# Patient Record
Sex: Female | Born: 1955
Health system: Southern US, Community
[De-identification: ages and names within clinical notes are randomized; demographics above are authoritative.]

## PROBLEM LIST (undated history)

## (undated) DIAGNOSIS — G43909 Migraine, unspecified, not intractable, without status migrainosus: Secondary | ICD-10-CM

## (undated) DIAGNOSIS — K219 Gastro-esophageal reflux disease without esophagitis: Secondary | ICD-10-CM

## (undated) DIAGNOSIS — Z9889 Other specified postprocedural states: Secondary | ICD-10-CM

## (undated) DIAGNOSIS — K449 Diaphragmatic hernia without obstruction or gangrene: Secondary | ICD-10-CM

## (undated) DIAGNOSIS — M1990 Unspecified osteoarthritis, unspecified site: Secondary | ICD-10-CM

## (undated) HISTORY — PX: ANTERIOR CRUCIATE LIGAMENT REPAIR: SHX115

## (undated) HISTORY — PX: ABDOMINAL HYSTERECTOMY: SHX81

## (undated) HISTORY — PX: BREAST SURGERY: SHX581

## (undated) HISTORY — PX: CHOLECYSTECTOMY: SHX55

---

## 1998-03-04 ENCOUNTER — Encounter: Payer: Self-pay | Admitting: Neurosurgery

## 1998-03-04 ENCOUNTER — Ambulatory Visit (HOSPITAL_COMMUNITY): Admission: RE | Admit: 1998-03-04 | Discharge: 1998-03-04 | Payer: Self-pay | Admitting: Neurosurgery

## 1998-05-14 ENCOUNTER — Ambulatory Visit: Admission: RE | Admit: 1998-05-14 | Discharge: 1998-05-14 | Payer: Self-pay | Admitting: Internal Medicine

## 1998-05-14 ENCOUNTER — Encounter: Payer: Self-pay | Admitting: Neurosurgery

## 1998-10-22 ENCOUNTER — Other Ambulatory Visit: Admission: RE | Admit: 1998-10-22 | Discharge: 1998-10-22 | Payer: Self-pay | Admitting: *Deleted

## 1998-11-18 ENCOUNTER — Ambulatory Visit (HOSPITAL_COMMUNITY): Admission: RE | Admit: 1998-11-18 | Discharge: 1998-11-18 | Payer: Self-pay | Admitting: *Deleted

## 1998-11-18 ENCOUNTER — Encounter: Payer: Self-pay | Admitting: *Deleted

## 1999-07-03 ENCOUNTER — Emergency Department (HOSPITAL_COMMUNITY): Admission: EM | Admit: 1999-07-03 | Discharge: 1999-07-03 | Payer: Self-pay | Admitting: Emergency Medicine

## 1999-07-03 ENCOUNTER — Encounter: Payer: Self-pay | Admitting: Emergency Medicine

## 1999-10-08 ENCOUNTER — Encounter: Payer: Self-pay | Admitting: Emergency Medicine

## 1999-10-08 ENCOUNTER — Inpatient Hospital Stay (HOSPITAL_COMMUNITY): Admission: EM | Admit: 1999-10-08 | Discharge: 1999-10-09 | Payer: Self-pay | Admitting: Emergency Medicine

## 1999-10-09 ENCOUNTER — Encounter: Payer: Self-pay | Admitting: Internal Medicine

## 1999-10-12 ENCOUNTER — Ambulatory Visit (HOSPITAL_COMMUNITY): Admission: RE | Admit: 1999-10-12 | Discharge: 1999-10-12 | Payer: Self-pay | Admitting: Interventional Cardiology

## 1999-10-15 DIAGNOSIS — C4491 Basal cell carcinoma of skin, unspecified: Secondary | ICD-10-CM

## 1999-10-15 HISTORY — DX: Basal cell carcinoma of skin, unspecified: C44.91

## 1999-11-05 ENCOUNTER — Ambulatory Visit (HOSPITAL_COMMUNITY): Admission: RE | Admit: 1999-11-05 | Discharge: 1999-11-05 | Payer: Self-pay | Admitting: Family Medicine

## 1999-11-05 ENCOUNTER — Encounter: Payer: Self-pay | Admitting: Family Medicine

## 1999-11-23 ENCOUNTER — Encounter: Payer: Self-pay | Admitting: *Deleted

## 1999-11-23 ENCOUNTER — Ambulatory Visit (HOSPITAL_COMMUNITY): Admission: RE | Admit: 1999-11-23 | Discharge: 1999-11-23 | Payer: Self-pay | Admitting: *Deleted

## 2000-03-25 ENCOUNTER — Encounter: Admission: RE | Admit: 2000-03-25 | Discharge: 2000-06-23 | Payer: Self-pay | Admitting: Neurosurgery

## 2000-05-23 DIAGNOSIS — D229 Melanocytic nevi, unspecified: Secondary | ICD-10-CM

## 2000-05-23 HISTORY — DX: Melanocytic nevi, unspecified: D22.9

## 2000-07-13 ENCOUNTER — Inpatient Hospital Stay (HOSPITAL_COMMUNITY): Admission: RE | Admit: 2000-07-13 | Discharge: 2000-07-15 | Payer: Self-pay | Admitting: *Deleted

## 2000-11-23 ENCOUNTER — Encounter: Payer: Self-pay | Admitting: *Deleted

## 2000-11-23 ENCOUNTER — Ambulatory Visit (HOSPITAL_COMMUNITY): Admission: RE | Admit: 2000-11-23 | Discharge: 2000-11-23 | Payer: Self-pay | Admitting: *Deleted

## 2001-06-22 ENCOUNTER — Ambulatory Visit (HOSPITAL_COMMUNITY): Admission: RE | Admit: 2001-06-22 | Discharge: 2001-06-22 | Payer: Self-pay | Admitting: Family Medicine

## 2001-06-22 ENCOUNTER — Encounter: Payer: Self-pay | Admitting: Family Medicine

## 2001-07-11 ENCOUNTER — Ambulatory Visit (HOSPITAL_COMMUNITY): Admission: RE | Admit: 2001-07-11 | Discharge: 2001-07-11 | Payer: Self-pay | Admitting: Pediatrics

## 2001-08-21 ENCOUNTER — Other Ambulatory Visit: Admission: RE | Admit: 2001-08-21 | Discharge: 2001-08-21 | Payer: Self-pay | Admitting: *Deleted

## 2001-11-27 ENCOUNTER — Encounter: Payer: Self-pay | Admitting: *Deleted

## 2001-11-27 ENCOUNTER — Ambulatory Visit (HOSPITAL_COMMUNITY): Admission: RE | Admit: 2001-11-27 | Discharge: 2001-11-27 | Payer: Self-pay | Admitting: *Deleted

## 2002-08-27 ENCOUNTER — Other Ambulatory Visit: Admission: RE | Admit: 2002-08-27 | Discharge: 2002-08-27 | Payer: Self-pay | Admitting: *Deleted

## 2002-11-29 ENCOUNTER — Ambulatory Visit (HOSPITAL_COMMUNITY): Admission: RE | Admit: 2002-11-29 | Discharge: 2002-11-29 | Payer: Self-pay | Admitting: *Deleted

## 2002-11-29 ENCOUNTER — Encounter: Payer: Self-pay | Admitting: *Deleted

## 2003-02-28 ENCOUNTER — Ambulatory Visit (HOSPITAL_COMMUNITY): Admission: RE | Admit: 2003-02-28 | Discharge: 2003-02-28 | Payer: Self-pay | Admitting: *Deleted

## 2003-02-28 ENCOUNTER — Encounter: Payer: Self-pay | Admitting: *Deleted

## 2003-05-21 ENCOUNTER — Ambulatory Visit (HOSPITAL_COMMUNITY): Admission: RE | Admit: 2003-05-21 | Discharge: 2003-05-21 | Payer: Self-pay | Admitting: Plastic Surgery

## 2003-05-21 ENCOUNTER — Ambulatory Visit (HOSPITAL_BASED_OUTPATIENT_CLINIC_OR_DEPARTMENT_OTHER): Admission: RE | Admit: 2003-05-21 | Discharge: 2003-05-21 | Payer: Self-pay | Admitting: Plastic Surgery

## 2003-05-21 ENCOUNTER — Encounter (INDEPENDENT_AMBULATORY_CARE_PROVIDER_SITE_OTHER): Payer: Self-pay | Admitting: Specialist

## 2003-08-28 ENCOUNTER — Other Ambulatory Visit: Admission: RE | Admit: 2003-08-28 | Discharge: 2003-08-28 | Payer: Self-pay | Admitting: *Deleted

## 2003-12-03 ENCOUNTER — Ambulatory Visit (HOSPITAL_COMMUNITY): Admission: RE | Admit: 2003-12-03 | Discharge: 2003-12-03 | Payer: Self-pay | Admitting: *Deleted

## 2004-03-31 DIAGNOSIS — C4491 Basal cell carcinoma of skin, unspecified: Secondary | ICD-10-CM

## 2004-03-31 HISTORY — DX: Basal cell carcinoma of skin, unspecified: C44.91

## 2004-09-14 ENCOUNTER — Other Ambulatory Visit: Admission: RE | Admit: 2004-09-14 | Discharge: 2004-09-14 | Payer: Self-pay | Admitting: Obstetrics and Gynecology

## 2004-10-16 ENCOUNTER — Ambulatory Visit (HOSPITAL_COMMUNITY): Admission: RE | Admit: 2004-10-16 | Discharge: 2004-10-16 | Payer: Self-pay | Admitting: Family Medicine

## 2004-10-16 ENCOUNTER — Emergency Department (HOSPITAL_COMMUNITY): Admission: EM | Admit: 2004-10-16 | Discharge: 2004-10-16 | Payer: Self-pay | Admitting: Family Medicine

## 2004-10-16 ENCOUNTER — Ambulatory Visit (HOSPITAL_COMMUNITY): Admission: RE | Admit: 2004-10-16 | Discharge: 2004-10-16 | Payer: Self-pay | Admitting: *Deleted

## 2004-12-04 ENCOUNTER — Ambulatory Visit (HOSPITAL_COMMUNITY): Admission: RE | Admit: 2004-12-04 | Discharge: 2004-12-04 | Payer: Self-pay | Admitting: Obstetrics and Gynecology

## 2004-12-08 ENCOUNTER — Ambulatory Visit: Admission: RE | Admit: 2004-12-08 | Discharge: 2004-12-08 | Payer: Self-pay | Admitting: Gynecologic Oncology

## 2004-12-29 ENCOUNTER — Encounter (INDEPENDENT_AMBULATORY_CARE_PROVIDER_SITE_OTHER): Payer: Self-pay | Admitting: *Deleted

## 2004-12-29 ENCOUNTER — Inpatient Hospital Stay (HOSPITAL_COMMUNITY): Admission: RE | Admit: 2004-12-29 | Discharge: 2005-01-01 | Payer: Self-pay | Admitting: Obstetrics and Gynecology

## 2005-02-16 ENCOUNTER — Ambulatory Visit: Admission: RE | Admit: 2005-02-16 | Discharge: 2005-02-16 | Payer: Self-pay | Admitting: Gynecologic Oncology

## 2005-04-18 ENCOUNTER — Ambulatory Visit (HOSPITAL_COMMUNITY): Admission: RE | Admit: 2005-04-18 | Discharge: 2005-04-18 | Payer: Self-pay | Admitting: Neurosurgery

## 2005-06-02 ENCOUNTER — Ambulatory Visit (HOSPITAL_COMMUNITY): Admission: RE | Admit: 2005-06-02 | Discharge: 2005-06-02 | Payer: Self-pay | Admitting: Neurosurgery

## 2006-01-27 ENCOUNTER — Ambulatory Visit (HOSPITAL_COMMUNITY): Admission: RE | Admit: 2006-01-27 | Discharge: 2006-01-27 | Payer: Self-pay | Admitting: Family Medicine

## 2006-03-11 ENCOUNTER — Ambulatory Visit: Payer: Self-pay | Admitting: Gastroenterology

## 2006-03-25 ENCOUNTER — Ambulatory Visit (HOSPITAL_COMMUNITY): Admission: RE | Admit: 2006-03-25 | Discharge: 2006-03-25 | Payer: Self-pay | Admitting: Gastroenterology

## 2006-07-29 ENCOUNTER — Ambulatory Visit (HOSPITAL_COMMUNITY): Admission: RE | Admit: 2006-07-29 | Discharge: 2006-07-29 | Payer: Self-pay | Admitting: Gastroenterology

## 2007-01-30 ENCOUNTER — Ambulatory Visit (HOSPITAL_COMMUNITY): Admission: RE | Admit: 2007-01-30 | Discharge: 2007-01-30 | Payer: Self-pay | Admitting: Obstetrics and Gynecology

## 2008-01-31 ENCOUNTER — Ambulatory Visit (HOSPITAL_COMMUNITY): Admission: RE | Admit: 2008-01-31 | Discharge: 2008-01-31 | Payer: Self-pay | Admitting: Obstetrics and Gynecology

## 2008-05-28 ENCOUNTER — Emergency Department (HOSPITAL_COMMUNITY): Admission: EM | Admit: 2008-05-28 | Discharge: 2008-05-28 | Payer: Self-pay | Admitting: Emergency Medicine

## 2008-06-21 DIAGNOSIS — Z87442 Personal history of urinary calculi: Secondary | ICD-10-CM

## 2008-06-21 HISTORY — DX: Personal history of urinary calculi: Z87.442

## 2009-01-31 ENCOUNTER — Ambulatory Visit (HOSPITAL_COMMUNITY): Admission: RE | Admit: 2009-01-31 | Discharge: 2009-01-31 | Payer: Self-pay | Admitting: Obstetrics and Gynecology

## 2009-09-01 ENCOUNTER — Emergency Department (HOSPITAL_COMMUNITY): Admission: EM | Admit: 2009-09-01 | Discharge: 2009-09-01 | Payer: Self-pay | Admitting: Emergency Medicine

## 2010-02-02 ENCOUNTER — Ambulatory Visit (HOSPITAL_COMMUNITY): Admission: RE | Admit: 2010-02-02 | Discharge: 2010-02-02 | Payer: Self-pay | Admitting: Obstetrics and Gynecology

## 2010-11-06 NOTE — Discharge Summary (Signed)
[facility]  Patient:    Kendra Le, Kendra Le                      MRN: [id number] Adm. Date:  [id number] Disc. Date: [id number] Attending:  Donne Hazel                           Discharge Summary  HISTORY OF PRESENT ILLNESS:   Ms. Kendra Le is a 55 year old female, status post total abdominal hysterectomy in the past.  The patient is admitted for laparoscopic bilateral salpingo-oophorectomy due to persistent ovarian cyst and pelvic pain.  These cysts were followed conservatively for a short period of time; these cysts persisted and in fact enlarged.  She is admitted for removal of these.  PAST MEDICAL HISTORY:         History of kidney stones.  PAST SURGICAL HISTORY:        1. History of cholecystectomy.                               2. Total abdominal hysterectomy.                               3. History of left knee surgery.  CURRENT MEDICATIONS:          Aspirin and multivitamins.  ALLERGIES:                    PENICILLIN, VERSED, and E-MYCIN.  SOCIAL HISTORY:               Negative.  PHYSICAL EXAMINATION:         Please see clinic admission history and physical.  ADMISSION DIAGNOSES:          1. Bilateral ovarian cyst.                               2. Pelvic pain.  HOSPITAL COURSE:              The patient was admitted on the same day of surgery, July 12, 2000.  At the last moment, I decided to proceed with laparotomy instead of laparoscopy due to the size of the ovarian cyst.  At the time of surgery, severe pelvic adhesions were encountered, bilateral ovarian endometriomas was removed.  The surgery went well but was lengthy.  Bilateral oophorectomy was carried out without incident.  The patients postoperative course was somewhat complicated by mild ileus. This was treated with conservative medical management and her hemoglobin stabilized at 10.3.  She was ambulating and soon had return of normal bowel and bladder function prior to  discharge.  She is discharged postoperative day #3 in stable condition.  DISCHARGE DIAGNOSES:          1. Status post laparotomy with bilateral                                  salpingo-oophorectomy for bilateral ovarian                                  cysts - endometriomas.  2. Severe pelvic adhesions.                               3. Mild postoperative ileus.  PLAN:                         1. Home.                               2. Darvocet, #30.                               3. Premarin 1.25 mg one p.o. q.d.                               4. Followup in the office in one week to                                  remove staples and for a recheck.  Iron                                  will be started for her mild anemia and at                                  the postoperative check when she has more                                  normal bowel function.  Routine discharge instructions and postoperative care given. DD:  08/09/00 TD:  08/10/00 Job: [id number]

## 2010-11-06 NOTE — Assessment & Plan Note (Signed)
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 She is referred through the courtesy of  Dr. Henderson Cloud for evaluation of passage of flatus per vagina.   Kendra Le has a very long and involved history of recurrent pelvic surgery  and abdominal surgery. Apparently, in 1994 she had a cholecystectomy  performed by Dr. Crista Luria, at that time was noted to have a very  large uterus with uterine fibroids. She subsequently had a hysterectomy done  and did well, although she had recurrent problems with the left ovarian  cysts that required bilateral oophorectomy apparently in 2000. She continued  to have lower abdominal pain and apparently underwent CT scan of the abdomen  last year. It showed endometriosis and a ovarian remnant on the left side  and a large ovarian cyst. This was removed surgically in July 2006. I will  try to obtain these reports for review.   Apparently, at the time of her most recent surgery, she did have a prolonged  ileus but had no trauma to her bowel which she is aware. Since her surgery,  she has had continued intermittent left lower quadrant pain described as a  spasmodic pain which will last anywhere from a few minutes to two hours in  duration, without any precipitating or alleviating elements. She is having  fairly regular bowel movements, which for her is three or four soft bowel  movements without melena or hematochezia. Recent exam by Dr. Henderson Cloud  averaged one out of three stools were guaiac positive. Pain does not awaken  the patient from sleep, is not related to having a bowel movement. There is  no abdominal gas, bloating, upper gastrointestinal,  or hepatobiliary  complaints except for occasional acid reflux managed by antacids. She has  noticed passage of flatus per vagina and apparently saw Dr. Henderson Cloud and had  a detailed exam including colposcopy that was negative. Patient has followed  a regular diet and has no anorexia or weight loss. She denies fever, chills,  skin rashes, joint pains, oral stomatitis, or any history of inflammatory  bowel disease. As mentioned above, she is status post cholecystectomy and  hysterectomy.   Her past medical history is also remarkable for recurrent kidney stones and  chronic headaches. She has been told in the past she had a hiatal hernia.   MEDICATIONS:  1. Vivelle patch 0.1 mg twice a week.  2. Aspirin 81 mg daily.   ALLERGIES:  PENICILLIN and MYCINS.   FAMILY HISTORY:  Noncontributory, without known gastrointestinal problems.   SOCIAL HISTORY:  Patient is married and lives with her husband. She has a  Event organiser in nursing. She does not smoke or use ethanol.   REVIEW OF SYSTEMS:  Otherwise noncontributory, without any cardiovascular,  pulmonary, genitourinary, neurologic, orthopaedic, or  endocrine problems.   PHYSICAL EXAMINATION:  On exam today, shows her to be a healthy appearing  white female in no acute distress, appearing her stated age.  She is 5'2, weight 168 pounds, blood pressure 122/68, pulse 68 and regular.  I could not appreciate stigmata of chronic liver disease or thyromegaly.  CHEST: Clear, anteriorly and posteriorly.  There were no murmurs, gallops or rubs on cardiac exam and she appeared to  be in a regular rhythm.  I could not appreciate hepatomegaly, abdominal masses, but there was  tenderness in the left lower quadrant without rebound.  There was a negative psoas and obturator sign in the left leg. There was no  peripheral edema, phlebitis, or swollen joints.  Inspection of the rectum was unremarkable as was rectal exam.  There was a  large volume of soft  stool in the rectal vault that was guaiac negative.   ASSESSMENT:  Kendra Le' symptomatology  certainly suggests a possible of a  rectovaginal fistula, perhaps related to her most recent somewhat involved  pelvic surgery. There is certainly nothing in her exam or history that  suggests Crohn's disease, colon carcinoma, or other inflammatory processes  in her abdomen, but she is rather tender in the left lower quadrant.   RECOMMENDATIONS:  I have gone ahead and set [facility] for barium enema exam. I  have asked the radiologist to pay close attention to the possibility of  fistula presence. Should her barium enema be unremarkable, proceed with  colonoscopy exam. I have given her some Levsin 0.1 mg to use on a p.r.n.  basis for abdominal pain in the interim. Other considerations would be that  she has had  occult diverticulitis and has a fistula associated with this  process. I did send her by the lab today to check a sed rate and C-reactive  protein exam.                                   Vania Rea. Jarold Motto, MD, Clementeen Graham, [place]   DRP/MedQ  DD:  03/11/2006  DT:  03/14/2006  Job #:  [id number]   cc:   Guy Sandifer. Henderson Cloud, M.D.  Holley Bouche, M.D.

## 2010-11-06 NOTE — Op Note (Signed)
NAME:  Kendra Le, Kendra Le                         ACCOUNT NO.:  [id number]   MEDICAL RECORD NO.:  [id number]                   PATIENT TYPE:  AMB   LOCATION:  DSC                                  FACILITY:  [facility]   PHYSICIAN:  Etter Sjogren, M.D.                  DATE OF BIRTH:  1956-02-18   DATE OF PROCEDURE:  05/21/2003  DATE OF DISCHARGE:                                 OPERATIVE REPORT   PREOPERATIVE DIAGNOSIS:  Frontal bone osteoma.   POSTOPERATIVE DIAGNOSIS:  Frontal bone osteoma.   PROCEDURE:  Excision of an osteoma of the frontal bone.   SURGEON:  Etter Sjogren, M.D.   ANESTHESIA:  General.   ESTIMATED BLOOD LOSS:  Minimal.   INDICATIONS FOR PROCEDURE:  A 55 year old woman has a lesion on her forehead  that has been growing for almost a year. It has enlarged steadily. It is  medically to necessary to excise it. It appears to be bone. The nature of  the procedure and the risks were discussed with her including possibility of  recurrence, scarring, wound healing problems and the overall convalescence  including swelling and bruising. She understood all of this and wished to  proceed.   DESCRIPTION OF PROCEDURE:  The patient was taken to the operating room and  placed supine. She was prepped with Betadine and draped with sterile drapes.  One percent Xylocaine with epinephrine was infiltrated as a local. A  transverse incision made with the skin creases. The dissection was carried  down through the muscle layer. Meticulous hemostasis with the  electrocautery. The underlying bony mass was identified. The periosteal  elevator was used to expose it. The bony mass was removed using an  osteotome. Rasping was performed to smooth the underlying surface.  Hemostasis was confirmed. Irrigated with saline. Layered closure with  4-0  Monocryl interrupted inverted muscle sutures for the muscle layer, followed  6-0 Prolene simple running suture. Antibiotic ointment, dry sterile dressing  and a head wrap were applied using a Kerlix and she was transferred to the  recovery room in stable condition. Tolerated the procedure well. We will  check her back in the office next week.                                               Etter Sjogren, M.D.    DB/MEDQ  D:  05/21/2003  T:  05/21/2003  Job:  [id number]

## 2010-11-06 NOTE — H&P (Signed)
[place]  Patient:    Kendra Le, Kendra Le                      MRN: [id number] Adm. Date:  [id number] Attending:  Nolene Ebbs Iv                         History and Physical  CURRENT COMPLAINT:  Chest pains.  HISTORY OF PRESENT ILLNESS:  This 55 year old white female presents to the emergency room with stabbing left chest pain, onset the afternoon of admission.  Initially 4/10 in degree, by time of examination 0/10 (improved after sublingual nitroglycerin).  The pain did not radiate but she has had some associated left rm tingling.  No nausea, shortness of breath, or diaphoresis, and the pain is nonexertional.  She has had similar symptoms off and on for the past couple of years, especially in the past two months.  Due to her strong family history and  relief after nitroglycerin, ER felt she should be admitted.  Does have family history of MI in her father in his 55s, but no history of diabetes, hypertension, tobacco use.  Cholesterol is equal to 190 in January 1999.  PAST MEDICAL HISTORY:  Status post hysterectomy, but ovaries are intact. Status post cholecystectomy.  History of headaches diagnosed in 1990 with benign intracranial hypertension.  History of left ACL reconstruction in 1996. History of asthma in childhood.  History of low back pain with disk bulge, L3-L4.  Status ost left radius/ulnar fracture in childhood.  Status post right clavicular fracture.  MEDICATIONS:  None except p.r.n. Rolaids.  ALLERGIES:  No known drug allergies, but nausea and vomiting with ERYTHROMYCIN nd PENICILLIN.  FAMILY HISTORY:  As above.  MI in father in his 22s.  Also had hypertension. Ultimately died secondary to mesenteric artery occlusion age 24.  Asthma in mother. Paternal grandfather with heart disease.  Maternal uncle with CVA.  Maternal grandfather with lung cancer.  SOCIAL HISTORY:  No tobacco or alcohol.  Works as a Chartered certified accountant at [employer].  REVIEW OF SYSTEMS:  Increased reflux symptoms recently.  Using increased Rolaids for the past week, otherwise negative.  PHYSICAL EXAMINATION:  VITAL SIGNS:  Temperature 97.2; blood pressure 142/80,initially; 108/47 now; pulse 90; respirations 18.  O2 saturation 100%.  GENERAL:  Well-developed, well-nourished, in no acute distress.  HEENT:  Pupils are equal, round, and reactive to light.  Fundi normal. Oropharynx normal.  TMs normal.  NECK:  Supple.  LUNGS:  Clear.  HEART:  Regular rate and rhythm without murmur, gallop, or rub.  She does have chest wall tenderness on the left sternal margin.  ABDOMEN:  Positive bowel sounds, soft and nontender.  No masses.  EXTREMITIES:  Within normal limits.  No edema.  Normal peripheral pulses.  NEUROLOGIC:  Grossly intact.  DTRs 2+ and symmetrical.  GENITOURINARY/GYNECOLOGY:  Deferred.  LABORATORY:  EKG within normal limits, unchanged since January 1999.  White blood count 9.9 with 57 neutrophils, 35 lymphs.  Hemoglobin 12.7, hematocrit 37.0, platelets 197,000.  PT 12.8, PTT 30, troponin I pending.  CK 84, MB less han 0.3.  Sodium 136, potassium 3.5, chloride 102, CO2 27, BUN 15, creatinine 0.8, glucose 99.  IMPRESSION:  Chest pain.  Rule out cardiac source although doubt.  Likely she has costochondritis with a strong family history and a level of concern.  Will admit for rule out.  PLAN:  1. Admit, serial CPK, isoenzymes, and troponin I.  Repeat EKG in the morning.     Naprosyn 500 mg b.i.d. to see if this helps the chest pain overnight.  2. If work-up is negative, then consider stress Cardiolite.DD:  10/08/99 TD:  10/08/99 Job: [id number]

## 2010-11-06 NOTE — Op Note (Signed)
Kendra Le, Kendra Le               ACCOUNT NO.:  [id number]   MEDICAL RECORD NO.:  [id number]          PATIENT TYPE:  AMB   LOCATION:  ENDO                         FACILITY:  [facility]   PHYSICIAN:  Anselmo Rod, M.D.  DATE OF BIRTH:  05-02-1956   DATE OF PROCEDURE:  07/29/2006  DATE OF DISCHARGE:  07/29/2006                               OPERATIVE REPORT   PROCEDURE PERFORMED:  Colonoscopy with injection of methylene blue into  the rectum.   ENDOSCOPIST:  Anselmo Rod, M.D.   INSTRUMENT USED:  Olympus video colonoscope.   INDICATIONS FOR PROCEDURE:  A 55 year old white female with a history of  vaginal flatulence and guaiac-positive stools undergoing a colonoscopy  to rule out colonic polyps, masses, etc.   PREPROCEDURE PREPARATION:  Informed consent was procured from the  patient.  The patient was fasted for four hours prior to the procedure  and prepped with two  Dulcolax  pills and a gallon of TriLyte the night  prior to the procedure.  Risks and benefits of the procedure including a  10% misread  of cancer and polyp were discussed with the patient as  well.   PREPROCEDURE PHYSICAL:  VITAL SIGNS:  The patient had stable vital  signs.  NECK:  Supple.  CHEST:  Clear to auscultation.  CARDIAC:  S1 and S2 regular.  ABDOMEN:  Soft with normal bowel sounds.   DESCRIPTION OF PROCEDURE:  The patient was placed in left lateral  decubitus position and sedated with 100 mcg of fentanyl and 10 mg of  Versed given intravenously in slow incremental doses.  Once the patient  was adequately sedated and maintained on low-flow oxygen and continuous  cardiac monitoring, the Pentax video colonoscope was advanced from the  rectum to the cecum.  The patient had a healthy-appearing colon and  terminal ileum.  There was some residual stool in the colon.  Multiple  washes were done.  No erosions, ulcerations, masses or polyps were seen.  Methylene blue was injected into the rectum through  sclerotherapy needle  and a tampon was placed in the vagina to see if there was any leakage of  the dye into the vagina to rule out rectovaginal fistula the patient has  had a normal barium enema.  The patient tolerated the procedure well  without complications.  Retroflexion in the rectum revealed no  abnormalities.   IMPRESSION:  Normal colonoscopy up to the terminal ileum.  No masses,  polyps, erosions, ulcerations or diverticula or hemorrhoids seen.   RECOMMENDATIONS:  The patient has been asked to check the tampon within  the next two hours.  If there is any leakage of the methylene blue into  the vagina, further workup will be necessary.  1. I will discuss the situation with Dr. Harold Hedge with regards to      her left lower quadrant pain as I suspect it      may be adhesions or endometriosis causing her problems.  2. Repeat colonoscopy has been recommended in the next 10 years.  If      the patient has any  abnormal symptoms prior to that, further      recommendations made as need arises in the future.      Anselmo Rod, M.D.  Electronically Signed     JNM/MEDQ  D:  07/31/2006  T:  08/01/2006  Job:  [id number]   cc:   Guy Sandifer. Henderson Cloud, M.D.  Melida Quitter, M.D.  Juluis Mire, M.D.

## 2010-11-06 NOTE — Discharge Summary (Signed)
NAMEOSIRIS, CHARLES               ACCOUNT NO.:  [id number]   MEDICAL RECORD NO.:  [id number]          PATIENT TYPE:  INP   LOCATION:  1611                         [facility]   PHYSICIAN:  Guy Sandifer. Henderson Cloud, M.D. DATE OF BIRTH:  Jun 19, 1956   DATE OF ADMISSION:  12/29/2004  DATE OF DISCHARGE:  01/01/2005                                 DISCHARGE SUMMARY   ADMISSION DIAGNOSIS:  Probable ovarian remnant.   DISCHARGE DIAGNOSES:  Probable ovarian remnant.   PROCEDURES:  On December 29, 2004, exploratory laparotomy with extensive lysis  of adhesions, left salpingo-oophorectomy and over sewing of cecum.   REASON FOR ADMISSION:  This patient is a 55 year old G0P0, status post  hysterectomy in 1995, and subsequent exploratory laparotomy and BSO for  severe endometriosis of adhesive disease.  She has had recurrent pain and  findings on CT consistent with a probable ovarian remnant.  She is admitted  for surgical management.   HOSPITAL COURSE:  The patient is admitted to the hospital and undergoes the  above procedure.  Estimated blood loss is 100 cc.  On the evening of  surgery, she has good pain relief and is without nausea or vomiting.  Vital  signs are stable.  She is afebrile with a clear urine output.  She has an NG  to low wall suction which is patent.  She is saturating well.  The following  day, she has some nausea relieved with Reglan and Phenergan.  She has had a  migraine headache and received one dose of subcu Imitrex and a Tylenol  suppository.  NG is putting out 250 cc.  She remains with stable vital signs  and is afebrile.  Abdomen is soft with few bowel sounds.  Hemoglobin is  10.7, white count 14.8.  Pathology is pending.  The following day, she is  without nausea.  She is ambulating.  Not yet passing flatus.  Vital signs  are stable and she is afebrile.  Complete metabolic panel is okay.  The NG  is clamped. The following day, she remains afebrile with stable vital  signs.  She is tolerating a regular diet, voiding and has had two loose bowel  movements.  Incision is healing well.  She is discharged home in stable  condition.   MEDICATIONS:  1.  Vicodin 5/500 1-2 q.6 h p.r.n.  2.  Ibuprofen p.r.n.   DISCHARGE INSTRUCTIONS:  No heavy lifting.  No vaginal entry.  No operation  of automobiles.  She is to call for problems including but not limited to  temperature of 101 degrees, persistent nausea and vomiting or increasing  pain.  Follow up is the following Monday in the GYN/Oncology Clinic for  staple removal.      Guy Sandifer. Henderson Cloud, M.D.  Electronically Signed     JET/MEDQ  D:  03/01/2005  T:  03/02/2005  Job:  [id number]

## 2010-11-06 NOTE — Consult Note (Signed)
NAMEMALEEA, CAMILO               ACCOUNT NO.:  [id number]   MEDICAL RECORD NO.:  [id number]          PATIENT TYPE:  OUT   LOCATION:  GYN                          FACILITY:  [facility]   PHYSICIAN:  Paola A. Duard Brady, MD    DATE OF BIRTH:  [birth year]   DATE OF CONSULTATION:  12/08/2004  DATE OF DISCHARGE:                                   CONSULTATION   Ms. Depass is a very pleasant 55 year old gravida 0 with a long history of  GYN issues.  She has had a hysterectomy in 1995 done abdominally secondary  to fibroids.  She subsequently had diagnostic laparoscopy, exploratory  laparotomy, BSO for severe endometriosis and adhesive disease.  She was in  her usual state of health and doing well until April 2006 at which time she  had an episode of some acute left lower quadrant discomfort which was  intense.  It had slowly been progressing over a few months.  She was seen  acutely and ruled out for renal calculus.  However, she underwent CT scan of  the pelvis for evaluation of a potential renal calculi.  CT scan revealed an  ovarian remnant.  Ultrasound was performed that revealed a 4.3 cm ovarian  remnant in the left lower quadrant.  There was ascites.  There was no  lymphadenopathy.  Repeat ultrasound was performed that showed a 3.1 cm  septated cystic area in the left adnexa most consistent with an ovarian  remnant.  She is scheduled for surgery with myself and Dr. Henderson Cloud frequency  December 29, 2004.  She is, otherwise, doing fairly well.  She denies any  significant change in her bowel or bladder habits.  She denies any bloating.  Her appetite is good.  She denies any early satiety.  She has been somewhat  fatigued and tired for the past 6 months which corresponds with the change  in her employment.  She has had thyroid function checked recently.  The  results of which are not available to me.   MEDICATIONS:  1.  Premarin 0.9 mg p.o. daily.  2.  Baby aspirin 81 mg p.o. daily.   FAMILY  HISTORY:  Her father had a myocardial infarction at the age of 8.  Paternal grandfather died of an MI at the age of 54.  Mother had an MI at  the age of 36.  Maternal grandfather had lung cancer, but he was a smoker.   PAST MEDICAL HISTORY:  Migraines.  In the past she has used Topamax.  She is  not using any now.   PAST SURGICAL HISTORY:  1.  In 1995 total abdominal hysterectomy.  2.  In 1994 laparoscopic cholecystectomy.  3.  In 2002 diagnostic laparoscopy, exploratory laparotomy, BSO for      endometriosis, left ACL repair, bilateral breast reduction.   SOCIAL HISTORY:  She is married.  She denies the use of tobacco or alcohol.  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   HEALTH MAINTENANCE:  She had a mammogram last week, the rest are not  available.  PHYSICAL EXAMINATION:  VITAL SIGNS:  Height 5 feet 2 inches, weight 159  pounds, blood pressure 110/70, pulse 72, respirations 18.  GENERAL:  Well-nourished, well-developed, female in no acute distress.  NECK:  Supple.  There is no lymphadenopathy, no thyromegaly.  LUNGS:  Clear to auscultation bilaterally.  CARDIOVASCULAR:  Regular rate and rhythm.  ABDOMEN:  Shows an infraumbilical incision in the right lower quadrant  laparoscopy incision. She has a well-healed transverse lower abdominal  incision.  Abdomen is soft.  There is some tenderness in the left lower  quadrant to deep palpation.  There is no rebound or guarding.  There is no  distinct mass. Groins are negative for adenopathy.  EXTREMITIES:  There is no edema.  PELVIC:  Bimanual examination reveals tenderness towards the left side of  the vaginal cuff.  There is a fullness at the top of the vaginal cuff on the  left side, but I cannot feel a distinct mass.  There is no nodularity.   ASSESSMENT:  A 55 year old with probable mass associated with an ovarian  remnant secondary to severe endometriosis and pelvic adhesive disease.  She  is scheduled for surgery  with myself and Dr. Henderson Cloud for December 29, 2004.   PLAN:  I spoke with the patient regarding laparotomy versus laparoscopy  despite the operative note and the significant adhesive disease, she would  be interested in proceeding with attempted laparoscopy, though she  understands if the adhesive disease is significant we will need to proceed  with laparotomy.  Risks of the surgery including injury to surrounding  organs, namely the ureter, and bleeding were discussed with the patient, and  she wishes to proceed.  She was given my card, and she knows that she can  contact me should she have any questions prior to the date of surgery.  She  knows I will be communicating with Dr. Henderson Cloud prior to that time as well.       PAG/MEDQ  D:  12/08/2004  T:  12/08/2004  Job:  [id number]   cc:   Guy Sandifer. Henderson Cloud, M.D.  [address]  [place]  [place] [address]  Fax: [phone]   Holley Bouche, M.D.  [address]  [place], [place] [address]  Fax: [phone]   Telford Nab, R.N.  [address]  [place], [place] [address]

## 2010-11-06 NOTE — Consult Note (Signed)
Kendra, Le               ACCOUNT NO.:  [id number]   MEDICAL RECORD NO.:  [id number]          PATIENT TYPE:  OUT   LOCATION:  GYN                          FACILITY:  [facility]   PHYSICIAN:  Paola A. Duard Brady, MD    DATE OF BIRTH:  June 07, 1956   DATE OF CONSULTATION:  02/16/2005  DATE OF DISCHARGE:                                   CONSULTATION   HISTORY OF PRESENT ILLNESS:  [name] is a very pleasant 55 year old  with a long history of GYN issues.  She most recently was referred to [place]  secondary to a 4.3 cm mass most consistent with an ovarian remnant in her  left lower quadrant.  She subsequent underwent exploratory laparotomy,  extensive lysis of adhesions, left salpingo-oophorectomy December 29, 2004 by  myself and Dr. Henderson Cloud. Final pathology was consistent with a endometriotic  cyst and endometriosis as well as a hemorrhagic corpus luteum cyst.  She  comes in today for followup with me.  She has had a postoperative check  with Dr. Henderson Cloud. She returned back to work on August 21.  She has done  well.  She has had significant hot flashes which is reassuring particularly  in the setting of removal of an ovarian remnant.  She was started on Vivelle  Dot 0.1 mg by Dr. Henderson Cloud and that has helped significantly with her  symptoms.  She is complaining of some difficulty sleeping at night. She is  taking Ambien and Benadryl  p.r.n. for sleep but she is concerned about the  addiction potential both.  She had some left lower quadrant discomfort which  started a few weeks ago. She is concerned that this may represent recurrence  endometriosis or recurrent ovarian remnant.  She otherwise is in her usual  state of health and doing well.   PHYSICAL EXAMINATION:  VITAL SIGNS:  Height 5 feet 2 inches, weight 156  pounds, blood pressure 110/70, pulse 64, respirations 16.  GENERAL:  Well-nourished, well-developed female in no acute distress.  ABDOMEN:  She has a well-healed vertical skin  incision.  There is  appropriate postoperative tenderness.  There is no rebound or guarding.  There is no palpable mass.   ASSESSMENT:  A 55 year old, status post exploratory laparotomy, left  salpingo-oophorectomy for ovarian remnant with extensive lysis of adhesions.   PLAN:  1.  For her sleep disturbance, I discussed that this may well be consistent      with menopausal-type symptoms.  I wrote her for Lunesta 1 mg at night      p.r.n. as it may be less addictive than some of the other sleep aids.      She will continue to use this sparingly.  2.  She will continue her Vivelle Dot.  3.  I have asked her to monitor this left lower quadrant pain.  I do feel      quite confident that we have removed all the left ovarian remnant and I      am not sure if the pain is more representative of adhesive disease      reforming.  The patient did have significant adhesive disease at the      time of surgery.  I do not feel that there is residual ovary either from      our operative findings on surgical procedures or her vasomotor symptoms,      but if the pain      persists, we may need to proceed with imaging.  She will contact either      myself or Dr. Henderson Cloud regarding this for additional evaluation.  She      knows that she has benign disease.  We will be happy to see her in the      future should the need arise.  She will be released to Dr. Huel Coventry      care.      Paola A. Duard Brady, MD  Electronically Signed     PAG/MEDQ  D:  02/16/2005  T:  02/17/2005  Job:  [id number]   cc:   Guy Sandifer. Henderson Cloud, M.D.  [address]  [place]  [place] [address]  Fax: [phone]   Holley Bouche, M.D.  [address]  [place], [place] [address]  Fax: [phone]   Telford Nab, R.N.  [address]  [place], [place] [address]

## 2010-11-06 NOTE — Op Note (Signed)
Kendra Le, Kendra Le               ACCOUNT NO.:  [id number]   MEDICAL RECORD NO.:  [id number]          PATIENT TYPE:  INP   LOCATION:  0007                         FACILITY:  [facility]   PHYSICIAN:  Paola A. Duard Brady, MD    DATE OF BIRTH:  1955/07/31   DATE OF PROCEDURE:  12/29/2004  DATE OF DISCHARGE:                                 OPERATIVE REPORT   PREOPERATIVE DIAGNOSIS:  Probable ovarian remnant, pelvic pain.   POSTOPERATIVE DIAGNOSIS:  Probable ovarian remnant, pelvic pain.   PROCEDURE:  Exploratory laparotomy, extensive lysis of adhesions for 1.5  hours.  Left salpingo-oophorectomy, oversewing of cecum.   SURGEONS:  1.  Paola A. Duard Brady, M.D.  2.  Guy Sandifer. Henderson Cloud, M.D.   ASSISTANT:  Telford Nab, R.N.   ANESTHESIA:  General anesthesia.   SPECIMENS:  Left tube and ovary.   ESTIMATED BLOOD LOSS:  100 mL.   URINE OUTPUT:  125 mL.   IV FLUIDS:  2000 mL.   COMPLICATIONS:  None.   PATHOLOGY:  Left ovary to pathology.   Informed consent was reviewed with the patient preoperatively.  All  questions were answered.  Risks and benefits were discussed and she wished  to proceed.  Patient was taken to the operating room and placed in the  supine position.  General anesthesia was induced.  She was then placed in  the dorsal lithotomy position with all appropriate precautions being taken.  The perineum was prepped in the usual fashion.  The Foley catheter was  inserted under sterile conditions.  The abdomen was prepped and draped in  the usual sterile fashion.  Time out was then performed.   A transverse skin incision in the patient's prior Pfannenstiel skin area was  made with the knife and carried down to the fascia sharply.  The fascia was  identified, explored and the fascial incision was extended laterally using  Bovie cautery.  A superior leaf of the fascia was grasped with the Kocher  clamps and underlying rectus bellies were dissected free.  A similar  procedure was  performed inferiorly.  The rectus bellies were noted to be  densely adherent in the midline.  Sharp dissection was used to open a  window.  At this time, we entered the peritoneal cavity and the bowel was  noted to be immediately under the rectus bellies.  There was  deserosalization of portion of the bowel.  The mucosa remained intact.  Using very careful sharp dissection with visualization of the underlying  bowel, the peritoneum was opened in its entirety.  Using sharp dissection,  the cecum was dissected free of the overlying peritoneal attachments.  This  allowed [place] to continue opening the peritoneal incision.  The small-bowel was  densely adherent to the anterior abdominal wall and this was taken down  using sharp dissection circumferentially around the incision.  At this  point, we were able to extend the surgical incision superiorly to allow [place]  more room for freedom.  Lysis of adhesions for an hour and a half then  ensued.  There were small-bowel to small-bowel adhesions, small-bowel  to  cecal adhesions, cecal to bladder adhesions, appendix to bladder adhesions  and small-bowel to rectosigmoid colon adhesions.  These were all freed.  Once the bowel was completely mobilized, it was run and there was noted to  be no other serosal injuries other than at the cecum.  The cecal injuries  were closed using interrupted 3-0 silks.   The Bookwalter self-retaining retractor was then placed on the table.  Short  retractor blades were used for side wall retractors.  The mouth was palpated  inferior and posterior to the rectosigmoid colon.  The rectosigmoid colon  was then dissected off the lateral left pelvic sidewall.  The mass was noted  in the sigmoid colon.  It was dissected freely from above it.  The round  ligament remnant on the patient's left side was elevated.  This allowed [place]  to open into the retroperitoneal spaces.  The ureter was identified.  The  infundibulopelvic vessels on the  left side was identified.  A window was  made in the peritoneum below the ovarian vessels and above the ureter.  The  IP was clamped, transected and ligated using 2-0 Vicryl.  The ovary was then  peeled off its attachments, deep within the pelvis.  This was done using  sharp dissection and Bovie cautery.  The cyst was ruptured with dark fluid.  There was no evidence of malignancy.  The remainder of the cyst was peeled  off the inferior attachments using Allis clamps.  The area was noted to be  hemostatic, however, two small pieces of Gelfoam were placed in the cavity  where the ovary was.  The abdomen was copiously irrigated.  The ureter was  reidentified and was noted to be peristalsing well inferior of the area of  dissection and was nondilated.  The bowel was again run and there was no  other areas of concern noted.  The abdomen and pelvis were copiously  irrigated.  The bowel was allowed to return into the normal anatomy. The  Bookwalter was removed and all laparotomy sponges were removed.  The omentum  was brought down to protect the bowel from the anterior abdominal wall.   The fascia was closed using running #1 PDS x2.  The subcu tissues were  irrigated and hemostasis obtained using Bovie cautery.  The skin was closed  using staplers.   The patient tolerated the procedure well.  All counts were correct x2.       PAG/MEDQ  D:  12/29/2004  T:  12/29/2004  Job:  [id number]   cc:   Guy Sandifer. Henderson Cloud, M.D.  [address]  [place]  [place] [address]  Fax: [phone]   Telford Nab, R.N.  [phone] [address]  [place], [place] [address]

## 2010-11-06 NOTE — Procedures (Signed)
[place]  Patient:    Kendra Le, Kendra Le Visit Number: [id number] MRN: [id number]          Service Type: OUT Location: [facility] Attending Physician:  Mick Sell Dictated by:   Deanna Artis. Sharene Skeans, M.D. Proc. Date: 07/11/01 Admit Date:  07/11/2001                             Procedure Report  DATE OF BIRTH:  [birth year].  INDICATION:  Intractable headache.  Prior history of pseudotumor cerebri.  DESCRIPTION OF PROCEDURE:  After informed consent, the patient was sterilely prepped and draped.  She was placed in an upright position.  Local anesthesia with 1% Xylocaine was placed at the L3-4 interspace.  On the second pass, the subarachnoid space was entered with a slight tinge of blood, which quickly cleared.  The patient was placed in the left lateral recumbent position.  Opening pressure was 146 mmH2O.  Thirteen cubic centimeters of clear, colorless fluid was obtained and sent to the lab for culture and Gram stain, glucose, protein, VDRL, cryptococcal antigen, cell count.  Six cubic centimeters was saved for further tests should they be needed.  The patient tolerated the procedure well.  This study effectively rules our subarachnoid hemorrhage or pseudotumor cerebri as etiologies of her symptoms.  Therefore, migraine seems to be the most likely etiology.  The patient will be treated through a saline lock with 10 mg of Reglan followed by 1 cc of DHE-45, followed by 10 mg of Decadron to see if we can bring her relief.  Should that fail, she will be given 50 mg of Demerol IV and sent home.  The patient tolerated the procedure well. Dictated by:   Deanna Artis. Sharene Skeans, M.D. Attending Physician:  Mick Sell DD:  07/11/01 TD:  07/12/01 Job: [id number]

## 2010-11-06 NOTE — Op Note (Signed)
[facility]  Patient:    Kendra Le, Kendra Le                        MRN: [id number] Proc. Date: 07/12/00 Attending:  Willey Blade, M.D.                           Operative Report  PREOPERATIVE DIAGNOSIS:       Bilateral ovarian masses.  POSTOPERATIVE DIAGNOSES:      1. Severe pelvic adhesions.                               2. Bilateral ovarian endometriomas.  PROCEDURES                    1. Laparotomy                               2. Lysis of adhesions.                               3. Bilateral salpingo-oophorectomy.  SURGEON:                      Willey Blade, M.D.  ANESTHESIA:                   General endotracheal.  ESTIMATED BLOOD LOSS:         200 cc.  COMPLICATIONS:                None.  FINDINGS:                     At the time of laparotomy, severe pelvic adhesions were encountered.  These were most notably involving the small bowel, the large bowel and the peritoneal surfaces.  These were quite severe.  The left ovary was eventually removed with a 6 cm endometrioma identified. The left ovary was significant for an approximate 2 cm area of endometriosis. No evidence of carcinoma was noted.  The appendix was visualized at the time of surgery and noted to be normal.  DESCRIPTION OF PROCEDURE:     The patient was taken to the operating room, where a general endotracheal anesthetic was administered.  The patient was placed on the operating table in the supine position.  The abdomen was prepped and draped in the usual sterile fashion with Betadine and sterile drapes.  A Foley catheter was inserted.  The abdomen was entered through a Pfannenstiel incision and carried down sharply in the usual fashion.  The peritoneum was atraumatically entered.  Extensive lysis of adhesions was carried out to normalize the anatomy.  This was especially prominent with small bowel and the anterior abdominal wall.  Eventually after tedious dissection, the  ovaries were encountered.  First, the left ovary was freed and elevated into the incision. Prior to this, a self retaining retractor was placed when the anatomy was normalized and the bowel packed away with laparotomy packs.  The ovary was elevated into the incision and freed from the pelvic sidewall with blunt and sharp dissection.  The infundibulopelvic ligament was isolated, clamped with a Heaney clamp and the ovary dissected free.  The infundibulopelvic ligament was then doubly ligated, first with a free  tie of 0 Vicryl, followed by a transfixing suture of 0 Vicryl.  The same procedure was repeated on the right tube and ovary.  The pelvis was then thoroughly irrigated with copious amounts of irrigant and hemostasis was noted.  No evidence of peripheral tissue damage was identified.  Attention was then turned to closure.  All abdominal packs and abdominal instruments were removed.  The rectus muscle and anterior peritoneum were closed in the midline with a running stitch of 0 Vicryl suture.  The subfascial layers were hemostatic.  The fascia was then closed with a running stitch of 0 Panacryl.  The subcutaneous tissue was irrigated and made hemostatic using the Bovie cautery.  The skin was reapproximated with staples and a sterile dressing applied.  The patient did receive an antibiotic intraoperatively.  Blood loss was 200 cc.  Final sponge, needle and instrument counts were correct x 3.  There were no perioperative complications. DD:  07/13/00 TD:  07/13/00 Job: [id number]

## 2011-01-07 ENCOUNTER — Other Ambulatory Visit (HOSPITAL_COMMUNITY): Payer: Self-pay | Admitting: Obstetrics and Gynecology

## 2011-01-07 DIAGNOSIS — Z1231 Encounter for screening mammogram for malignant neoplasm of breast: Secondary | ICD-10-CM

## 2011-02-15 ENCOUNTER — Ambulatory Visit (HOSPITAL_COMMUNITY)
Admission: RE | Admit: 2011-02-15 | Discharge: 2011-02-15 | Disposition: A | Discharge status: Home / Self Care | Payer: 59 | Source: Ambulatory Visit | Attending: Obstetrics and Gynecology | Admitting: Obstetrics and Gynecology

## 2011-02-15 DIAGNOSIS — Z1231 Encounter for screening mammogram for malignant neoplasm of breast: Secondary | ICD-10-CM

## 2011-05-21 ENCOUNTER — Emergency Department (HOSPITAL_COMMUNITY)
Admission: EM | Admit: 2011-05-21 | Discharge: 2011-05-21 | Payer: 59 | Attending: Emergency Medicine | Admitting: Emergency Medicine

## 2011-05-21 ENCOUNTER — Encounter: Payer: Self-pay | Admitting: Emergency Medicine

## 2011-05-21 DIAGNOSIS — R0789 Other chest pain: Secondary | ICD-10-CM | POA: Insufficient documentation

## 2011-05-21 DIAGNOSIS — Z9889 Other specified postprocedural states: Secondary | ICD-10-CM | POA: Insufficient documentation

## 2011-05-21 HISTORY — DX: Diaphragmatic hernia without obstruction or gangrene: K44.9

## 2011-05-21 LAB — CK TOTAL AND CKMB (NOT AT ARMC)
CK, MB: 2 ng/mL (ref 0.3–4.0)
Relative Index: INVALID (ref 0.0–2.5)
Total CK: 64 U/L (ref 7–177)

## 2011-05-21 LAB — CBC
HCT: 35.6 % — ABNORMAL LOW (ref 36.0–46.0)
Hemoglobin: 11.6 g/dL — ABNORMAL LOW (ref 12.0–15.0)
MCH: 28.8 pg (ref 26.0–34.0)
MCHC: 32.6 g/dL (ref 30.0–36.0)
MCV: 88.3 fL (ref 78.0–100.0)
Platelets: 427 10*3/uL — ABNORMAL HIGH (ref 150–400)
RBC: 4.03 MIL/uL (ref 3.87–5.11)
RDW: 13.8 % (ref 11.5–15.5)
WBC: 10.4 10*3/uL (ref 4.0–10.5)

## 2011-05-21 LAB — DIFFERENTIAL
Basophils Absolute: 0 10*3/uL (ref 0.0–0.1)
Basophils Relative: 0 % (ref 0–1)
Eosinophils Absolute: 0.1 10*3/uL (ref 0.0–0.7)
Eosinophils Relative: 1 % (ref 0–5)
Lymphocytes Relative: 41 % (ref 12–46)
Lymphs Abs: 4.3 10*3/uL — ABNORMAL HIGH (ref 0.7–4.0)
Monocytes Absolute: 0.8 10*3/uL (ref 0.1–1.0)
Monocytes Relative: 7 % (ref 3–12)
Neutro Abs: 5.2 10*3/uL (ref 1.7–7.7)
Neutrophils Relative %: 50 % (ref 43–77)

## 2011-05-21 LAB — COMPREHENSIVE METABOLIC PANEL
ALT: 15 U/L (ref 0–35)
AST: 21 U/L (ref 0–37)
Albumin: 3.6 g/dL (ref 3.5–5.2)
Alkaline Phosphatase: 107 U/L (ref 39–117)
BUN: 14 mg/dL (ref 6–23)
CO2: 27 mEq/L (ref 19–32)
Calcium: 9.2 mg/dL (ref 8.4–10.5)
Chloride: 100 mEq/L (ref 96–112)
Creatinine, Ser: 0.7 mg/dL (ref 0.50–1.10)
GFR calc Af Amer: >90 mL/min (ref >90)
GFR calc non Af Amer: >90 mL/min (ref >90)
Glucose, Bld: 88 mg/dL (ref 70–99)
Potassium: 3.5 mEq/L (ref 3.5–5.1)
Sodium: 139 mEq/L (ref 135–145)
Total Bilirubin: 0.2 mg/dL — ABNORMAL LOW (ref 0.3–1.2)
Total Protein: 7.3 g/dL (ref 6.0–8.3)

## 2011-05-21 LAB — TROPONIN I: Troponin I: <0.3 ng/mL (ref <0.30)

## 2011-05-21 MED ORDER — NITROGLYCERIN 0.4 MG SL SUBL
SUBLINGUAL_TABLET | SUBLINGUAL | Status: AC
  Filled 2011-05-21: qty 25

## 2011-05-21 MED ORDER — ASPIRIN 81 MG PO CHEW
CHEWABLE_TABLET | ORAL | Status: AC
  Filled 2011-05-21: qty 3

## 2011-05-21 MED ORDER — NITROGLYCERIN 0.4 MG SL SUBL
0.4000 mg | SUBLINGUAL_TABLET | Freq: Once | SUBLINGUAL | Status: AC
  Administered 2011-05-21: 0.4 mg via SUBLINGUAL

## 2011-05-21 MED ORDER — GI COCKTAIL ~~LOC~~
30.0000 mL | Freq: Once | ORAL | Status: AC
  Administered 2011-05-21: 30 mL via ORAL
  Filled 2011-05-21: qty 30

## 2011-05-21 MED ORDER — ASPIRIN 81 MG PO CHEW
243.0000 mg | CHEWABLE_TABLET | Freq: Once | ORAL | Status: AC
  Administered 2011-05-21: 243 mg via ORAL

## 2011-05-21 NOTE — ED Provider Notes (Signed)
History     CSN: [id number] Arrival date & time: 05/21/2011  4:10 PM   First MD Initiated Contact with Patient 05/21/11 1620      Chief Complaint  Patient presents with  . Chest Pain    (Consider location/radiation/quality/duration/timing/severity/associated sxs/prior treatment) HPI Comments: Was driving home from work today, started with tightness in the chest under breasts.  No other associated symptoms.  No recent exertional symptoms.    Patient is a 55 y.o. female presenting with chest pain. The history is provided by the patient.  Chest Pain The chest pain began less than 1 hour ago. Chest pain occurs constantly. The chest pain is improving. At its most intense, the pain is at 5/10. The pain is currently at 2/10. The severity of the pain is moderate. The quality of the pain is described as tightness. The pain does not radiate. Pertinent negatives for primary symptoms include no fever, no shortness of breath, no palpitations, no nausea and no dizziness. She tried nothing for the symptoms. Risk factors include no known risk factors.  Her family medical history is significant for CAD in family.     Past Medical History  Diagnosis Date  . Hiatal hernia     Past Surgical History  Procedure Date  . Cholecystectomy   . Abdominal hysterectomy   . Anterior cruciate ligament repair   . Breast surgery     No family history on file.  History  Substance Use Topics  . Smoking status: Never Smoker   . Smokeless tobacco: Not on file  . Alcohol Use: No    OB History    Grav Para Term Preterm Abortions TAB SAB Ect Mult Living                  Review of Systems  Constitutional: Negative for fever.  Respiratory: Negative for shortness of breath.   Cardiovascular: Positive for chest pain. Negative for palpitations.  Gastrointestinal: Negative for nausea.  Neurological: Negative for dizziness.  All other systems reviewed and are negative.    Allergies   Penicillins  Home Medications   Current Outpatient Rx  Name Route Sig Dispense Refill  . ASPIRIN 81 MG PO TABS Oral Take 81 mg by mouth daily.      Marland Kitchen KAPIDEX PO Oral Take by mouth.        BP 140/73  Pulse 86  Temp(Src) 97.8 F (36.6 C) (Oral)  Resp 17  SpO2 100%  Physical Exam  Nursing note and vitals reviewed. Constitutional: She is oriented to person, place, and time. She appears well-developed and well-nourished. No distress.  HENT:  Head: Normocephalic and atraumatic.  Neck: Normal range of motion. Neck supple.  Cardiovascular: Normal rate and regular rhythm.  Exam reveals no gallop and no friction rub.   No murmur heard. Pulmonary/Chest: Effort normal and breath sounds normal. No respiratory distress. She has no wheezes.  Abdominal: Soft. Bowel sounds are normal. She exhibits no distension. There is no tenderness.  Musculoskeletal: Normal range of motion.  Neurological: She is alert and oriented to person, place, and time.  Skin: Skin is warm and dry. She is not diaphoretic.    ED Course  Procedures (including critical care time)   Labs Reviewed  CBC  DIFFERENTIAL  COMPREHENSIVE METABOLIC PANEL  CK TOTAL AND CKMB  TROPONIN I   No results found.   No diagnosis found.   Date: 05/21/2011  Rate: 84  Rhythm: normal sinus rhythm  QRS Axis: normal  Intervals: normal  ST/T Wave abnormalities: normal  Conduction Disutrbances:none  Narrative Interpretation:   Old EKG Reviewed: unchanged   MDM  The EKG looks okay as does the heart enzymes and her symptoms seem atypical for cardiac pain.  She is feeling better and wants to go home.  I had discussion with her about what a negative workup thus far implies, and she understands that we have not ruled out a cardiac etiology.  She has seen Dr. Katrinka Blazing in the past and will follow up with him next week to discuss further workup as indicated.  She also assures me she will return if her symptoms worsen.        Geoffery Lyons, MD 05/21/11 [phone]

## 2011-05-21 NOTE — ED Notes (Signed)
Pt here via POV with a sudden onset of chest discomfort.  Pt reports that while driving home she began to feel as if a rubber band was around her waist under her breasts.  Pt rates 3/10, pt reports that awarm feeling came over her as well after the discomfort.  Pt reports the same sensation up the left side of her neck.  Pt denies sob, n/v and diaphoresis.  Pt alert and oriented x4.

## 2011-06-27 ENCOUNTER — Emergency Department
Admission: EM | Admit: 2011-06-27 | Discharge: 2011-06-27 | Disposition: A | Discharge status: Home / Self Care | Payer: 59 | Source: Home / Self Care | Attending: Family Medicine | Admitting: Family Medicine

## 2011-06-27 ENCOUNTER — Encounter: Payer: Self-pay | Admitting: Emergency Medicine

## 2011-06-27 DIAGNOSIS — J209 Acute bronchitis, unspecified: Secondary | ICD-10-CM

## 2011-06-27 LAB — POCT INFLUENZA A/B
Influenza A, POC: NEGATIVE
Influenza B, POC: NEGATIVE

## 2011-06-27 MED ORDER — LEVOFLOXACIN 500 MG PO TABS: 500.0000 mg | ORAL_TABLET | Freq: Every day | ORAL | Status: AC

## 2011-06-27 MED ORDER — BENZONATATE 200 MG PO CAPS: 200.0000 mg | ORAL_CAPSULE | Freq: Every day | ORAL | Status: DC

## 2011-06-27 NOTE — ED Provider Notes (Signed)
History     CSN: [id number]  Arrival date & time 06/27/11  1538   First MD Initiated Contact with Patient 06/27/11 1633      Chief Complaint  Patient presents with  . Cough     HPI Comments: Patient reports that she had a mild cold like illness about 3 weeks ago with sinus congestion, mild sore throat, and minimal cough that lasted about a week.  Two weeks ago she then developed a cough that has gradually become worse.  Over the past two days she developed fatigue and myalgias and the cough has become worse.  Last night she had watery diarrhea, and again this morning improved with Pepto Bismol.  She has developed soreness in her anterior chest but no shortness of breath.  She does not cough until she gags. She has had a flu shot this season, and Tdap is current.  The history is provided by the patient.    Past Medical History  Diagnosis Date  . Hiatal hernia     Past Surgical History  Procedure Date  . Cholecystectomy   . Abdominal hysterectomy   . Anterior cruciate ligament repair   . Breast surgery     No family history on file.  History  Substance Use Topics  . Smoking status: Never Smoker   . Smokeless tobacco: Not on file  . Alcohol Use: No    OB History    Grav Para Term Preterm Abortions TAB SAB Ect Mult Living                  Review of Systems No sore throat at present + cough, partly productive and worse at night. No pleuritic pain, but has anterior burning sensation in chest No wheezing Minimal nasal congestion at present No post-nasal drainage No sinus pain/pressure No itchy/red eyes No earache No hemoptysis No SOB + fever/chills No nausea No vomiting No abdominal pain + diarrhea yesterday and this morning No urinary symptoms No skin rashes + fatigue + myalgias yesterday No headache Used OTC meds without relief  Allergies  Erythromycin and Penicillins  Home Medications   Current Outpatient Rx  Name Route Sig Dispense Refill  .  ASPIRIN 81 MG PO TABS Oral Take 81 mg by mouth daily.     Marland Kitchen BENZONATATE 200 MG PO CAPS Oral Take 1 capsule (200 mg total) by mouth at bedtime. 12 capsule 0  . CALCIUM CARBONATE-VITAMIN D 500-200 MG-UNIT PO TABS Oral Take 1 tablet by mouth at bedtime.      . DEXLANSOPRAZOLE 60 MG PO CPDR Oral Take 60 mg by mouth daily.      Marland Kitchen ESTRADIOL 0.1 MG/24HR TD PTTW Transdermal Place 1 patch onto the skin 2 (two) times a week. Apply on Friday & Monday     . LEVOFLOXACIN 500 MG PO TABS Oral Take 1 tablet (500 mg total) by mouth daily. 7 tablet 0  . THERA M PLUS PO TABS Oral Take 1 tablet by mouth 3 (three) times daily.        BP 122/74  Pulse 103  Temp(Src) 98.3 F (36.8 C) (Oral)  Resp 16  Ht 5\' 2"  (1.575 m)  Wt 181 lb (82.101 kg)  BMI 33.11 kg/m2  SpO2 97%  Physical Exam Nursing notes and Vital Signs reviewed. Appearance:  Patient appears healthy, stated age, and in no acute distress.  Patient is obese (BMI 33.2)  Eyes:  Pupils are equal, round, and reactive to light and accomodation.  Extraocular movement  is intact.  Conjunctivae are not inflamed  Ears:  Canals normal.  Tympanic membranes normal.  Nose:  Mildly congested turbinates.  No sinus tenderness.   Pharynx:  Normal Neck:  Supple.   Tender shotty posterior nodes are palpated bilaterally  Lungs:  Clear to auscultation.  Breath sounds are equal.  Chest:  Distinct tenderness to palpation over the mid-sternum.  Heart:  Regular rate and rhythm without murmurs, rubs, or gallops.  Abdomen:  Nontender without masses or hepatosplenomegaly.  Bowel sounds are present.  No CVA or flank tenderness.  Extremities:  No edema.  No calf tenderness Skin:  No rash present.   ED Course  Procedures  none   Labs Reviewed  POCT INFLUENZA A/B negative      1. Acute bronchitis       MDM  Worsening cough with chills/sweats and diarrhea is suggestive of an atypical organism such as legionella. Begin Levaquin.  Tessalon at bedtime. Take Mucinex  (guaifenesin) twice daily for cough and congestion.  Increase fluid intake, rest. Follow-up with family doctor if not improving about 5 days.        Donna Christen, MD 06/27/11 [phone]

## 2011-06-27 NOTE — ED Notes (Signed)
Cough x 3 weeks, Diarrhea x 2 days

## 2011-10-14 ENCOUNTER — Ambulatory Visit (INDEPENDENT_AMBULATORY_CARE_PROVIDER_SITE_OTHER): Payer: 59 | Admitting: Family Medicine

## 2011-10-14 ENCOUNTER — Encounter: Payer: Self-pay | Admitting: Family Medicine

## 2011-10-14 VITALS — BP 114/77 | HR 80 | Temp 97.9°F | Ht 62.0 in | Wt 182.0 lb

## 2011-10-14 DIAGNOSIS — M25562 Pain in left knee: Secondary | ICD-10-CM

## 2011-10-14 DIAGNOSIS — M25569 Pain in unspecified knee: Secondary | ICD-10-CM

## 2011-10-14 NOTE — Patient Instructions (Signed)
Your pain is likely due to arthritis versus a degenerative meniscal tear in your knee. Both are treated similarly initially. Take tylenol 500mg  1-2 tabs three times a day for pain. Aleve 1-2 tabs twice a day with food (OR ibuprofen 600mg  three times a day with food). Glucosamine sulfate 750mg  twice a day is a supplement that has been shown to help moderate to severe arthritis. Capsaicin topically up to four times a day may also help with pain. Cortisone injections are an option. If cortisone injections do not help, there are different types of shots that may help but they take longer to take effect. It's important that you continue to stay active. Start quad strengthening (quad sets and straight leg raises) 3 sets of 10 once a day. Consider physical therapy to strengthen muscles around the joint that hurts to take pressure off of the joint itself. Shoe inserts with good arch support may be helpful. Walker or cane if needed. Heat or ice 15 minutes at a time 3-4 times a day as needed to help with pain. Water aerobics and cycling with low resistance are the best two types of exercise for arthritis. A knee brace may be helpful if your knee feels unstable due to pain. Follow up with me in 1 month or as needed. Generally shots aren't repeated more than every 3 months.

## 2011-10-15 ENCOUNTER — Encounter: Payer: Self-pay | Admitting: Family Medicine

## 2011-10-15 DIAGNOSIS — M25562 Pain in left knee: Secondary | ICD-10-CM | POA: Insufficient documentation

## 2011-10-15 NOTE — Assessment & Plan Note (Signed)
likely 2/2 DJD, possible degenerative medial meniscal tear.  Discussed medications - tylenol, aleve, glucosamine, capsaicin.  She'd like to try cortisone injection - given today.  Start quad strengthening.  Consider radiographs, formal PT if not improving as expected.  F/u in 1 month or prn.  After informed written consent, patient was seated on exam table. Left knee was prepped with alcohol swab and utilizing anterolateral approach, patient's left knee was injected intraarticularly with 3:1 marcaine: depomedrol. Patient tolerated the procedure well without immediate complications.

## 2011-10-15 NOTE — Progress Notes (Signed)
Subjective:    Patient ID: Kendra Le, female    DOB: May 13, 1956, 56 y.o.   MRN: [id number]  PCP: Dr. Tiburcio Pea  HPI 56 yo F here for left knee pain.  Patient denies recent injury. Reports she had an acl reconstruction on left in 1996. About 1 month ago started to develop pain within left knee - mostly medial and posterior. Associated with feeling of instability with full extension (like it's going to hyperextend). Has been icing knee, taking advil. No catching or locking. Going on vacation in a few days to [facility].  Past Medical History  Diagnosis Date  . Hiatal hernia     Current Outpatient Prescriptions on File Prior to Visit  Medication Sig Dispense Refill  . esomeprazole (NEXIUM) 40 MG capsule Take 40 mg by mouth daily before breakfast.      . aspirin 81 MG tablet Take 81 mg by mouth daily.       . calcium-vitamin D (OSCAL WITH D) 500-200 MG-UNIT per tablet Take 1 tablet by mouth at bedtime.        Marland Kitchen estradiol (VIVELLE-DOT) 0.1 MG/24HR Place 1 patch onto the skin 2 (two) times a week. Apply on Friday & Monday       . Multiple Vitamins-Minerals (MULTIVITAMINS THER. W/MINERALS) TABS Take 1 tablet by mouth 3 (three) times daily.          Past Surgical History  Procedure Date  . Cholecystectomy   . Abdominal hysterectomy   . Breast surgery   . Anterior cruciate ligament repair     left    Allergies  Allergen Reactions  . Erythromycin   . Penicillins Nausea And Vomiting    History   Social History  . Marital Status: Married    Spouse Name: N/A    Number of Children: N/A  . Years of Education: N/A   Occupational History  . Not on file.   Social History Main Topics  . Smoking status: Never Smoker   . Smokeless tobacco: Not on file  . Alcohol Use: No  . Drug Use: No  . Sexually Active:    Other Topics Concern  . Not on file   Social History Narrative  . No narrative on file    Family History  Problem Relation Age of Onset  . Heart attack Mother    . Hyperlipidemia Mother   . Heart attack Father   . Hyperlipidemia Father   . Hypertension Father   . Sudden death Neg Hx   . Diabetes Neg Hx     BP 114/77  Pulse 80  Temp(Src) 97.9 F (36.6 C) (Oral)  Ht 5\' 2"  (1.575 m)  Wt 182 lb (82.555 kg)  BMI 33.29 kg/m2  Review of Systems See HPI above.    Objective:   Physical Exam Gen: NAD  L knee: No gross deformity, ecchymoses, swelling.  1+ crepitation. TTP medial joint line.  No lateral joint line, post patellar facet or other TTP about knee. FROM. Negative ant/post drawers. Negative valgus/varus testing. Negative lachmanns. Negative mcmurrays, apleys, patellar apprehension, clarkes. NV intact distally.    Assessment & Plan:  1. Left knee pain - likely 2/2 DJD, possible degenerative medial meniscal tear.  Discussed medications - tylenol, aleve, glucosamine, capsaicin.  She'd like to try cortisone injection - given today.  Start quad strengthening.  Consider radiographs, formal PT if not improving as expected.  F/u in 1 month or prn.  After informed written consent, patient was seated on exam  table. Left knee was prepped with alcohol swab and utilizing anterolateral approach, patient's left knee was injected intraarticularly with 3:1 marcaine: depomedrol. Patient tolerated the procedure well without immediate complications.

## 2012-01-27 ENCOUNTER — Other Ambulatory Visit (HOSPITAL_COMMUNITY): Payer: Self-pay | Admitting: Obstetrics and Gynecology

## 2012-01-27 DIAGNOSIS — Z1231 Encounter for screening mammogram for malignant neoplasm of breast: Secondary | ICD-10-CM

## 2012-03-02 ENCOUNTER — Ambulatory Visit (HOSPITAL_COMMUNITY)
Admission: RE | Admit: 2012-03-02 | Discharge: 2012-03-02 | Disposition: A | Discharge status: Home / Self Care | Payer: 59 | Source: Ambulatory Visit | Attending: Obstetrics and Gynecology | Admitting: Obstetrics and Gynecology

## 2012-03-02 DIAGNOSIS — Z1231 Encounter for screening mammogram for malignant neoplasm of breast: Secondary | ICD-10-CM | POA: Insufficient documentation

## 2012-07-04 ENCOUNTER — Institutional Professional Consult (permissible substitution): Payer: 59 | Admitting: Cardiovascular Disease

## 2013-01-04 ENCOUNTER — Ambulatory Visit: Payer: 59 | Attending: Orthopaedic Surgery | Admitting: Rehabilitation

## 2013-01-04 DIAGNOSIS — M545 Low back pain, unspecified: Secondary | ICD-10-CM | POA: Insufficient documentation

## 2013-01-04 DIAGNOSIS — M25559 Pain in unspecified hip: Secondary | ICD-10-CM | POA: Insufficient documentation

## 2013-01-04 DIAGNOSIS — IMO0001 Reserved for inherently not codable concepts without codable children: Secondary | ICD-10-CM | POA: Insufficient documentation

## 2013-01-09 ENCOUNTER — Ambulatory Visit: Payer: 59 | Admitting: Rehabilitation

## 2013-01-11 ENCOUNTER — Ambulatory Visit: Payer: 59 | Admitting: Rehabilitation

## 2013-01-16 ENCOUNTER — Ambulatory Visit: Payer: 59 | Admitting: Rehabilitation

## 2013-01-18 ENCOUNTER — Ambulatory Visit: Payer: 59 | Admitting: Rehabilitation

## 2013-01-23 ENCOUNTER — Ambulatory Visit: Payer: 59 | Attending: Orthopaedic Surgery | Admitting: Rehabilitation

## 2013-01-23 DIAGNOSIS — IMO0001 Reserved for inherently not codable concepts without codable children: Secondary | ICD-10-CM | POA: Insufficient documentation

## 2013-01-23 DIAGNOSIS — M545 Low back pain, unspecified: Secondary | ICD-10-CM | POA: Insufficient documentation

## 2013-01-23 DIAGNOSIS — M25559 Pain in unspecified hip: Secondary | ICD-10-CM | POA: Insufficient documentation

## 2013-01-25 ENCOUNTER — Ambulatory Visit: Payer: 59 | Admitting: Rehabilitation

## 2013-01-30 ENCOUNTER — Ambulatory Visit: Payer: 59 | Admitting: Rehabilitation

## 2013-02-01 ENCOUNTER — Ambulatory Visit: Payer: 59 | Admitting: Rehabilitation

## 2013-02-05 ENCOUNTER — Ambulatory Visit: Payer: 59 | Admitting: Rehabilitation

## 2013-02-07 ENCOUNTER — Ambulatory Visit: Payer: 59 | Admitting: Rehabilitation

## 2013-02-13 ENCOUNTER — Ambulatory Visit: Payer: 59 | Admitting: Rehabilitation

## 2013-02-15 ENCOUNTER — Ambulatory Visit: Payer: 59 | Admitting: Rehabilitation

## 2013-02-20 ENCOUNTER — Ambulatory Visit: Payer: 59 | Attending: Orthopaedic Surgery | Admitting: Rehabilitation

## 2013-02-20 DIAGNOSIS — M545 Low back pain, unspecified: Secondary | ICD-10-CM | POA: Insufficient documentation

## 2013-02-20 DIAGNOSIS — M25559 Pain in unspecified hip: Secondary | ICD-10-CM | POA: Insufficient documentation

## 2013-02-20 DIAGNOSIS — IMO0001 Reserved for inherently not codable concepts without codable children: Secondary | ICD-10-CM | POA: Insufficient documentation

## 2013-02-21 ENCOUNTER — Ambulatory Visit: Payer: 59 | Admitting: Rehabilitation

## 2013-02-22 ENCOUNTER — Ambulatory Visit: Payer: 59 | Admitting: Rehabilitation

## 2013-03-01 ENCOUNTER — Ambulatory Visit: Payer: 59 | Admitting: Rehabilitation

## 2013-04-26 ENCOUNTER — Other Ambulatory Visit: Payer: Self-pay

## 2013-06-15 ENCOUNTER — Other Ambulatory Visit: Payer: Self-pay | Admitting: Obstetrics and Gynecology

## 2013-06-15 DIAGNOSIS — R928 Other abnormal and inconclusive findings on diagnostic imaging of breast: Secondary | ICD-10-CM

## 2013-06-22 ENCOUNTER — Ambulatory Visit
Admission: RE | Admit: 2013-06-22 | Discharge: 2013-06-22 | Disposition: A | Discharge status: Home / Self Care | Payer: 59 | Source: Ambulatory Visit | Attending: Obstetrics and Gynecology | Admitting: Obstetrics and Gynecology

## 2013-06-22 ENCOUNTER — Other Ambulatory Visit: Payer: Self-pay | Admitting: Obstetrics and Gynecology

## 2013-06-22 DIAGNOSIS — R928 Other abnormal and inconclusive findings on diagnostic imaging of breast: Secondary | ICD-10-CM

## 2013-10-23 ENCOUNTER — Encounter: Payer: Self-pay | Admitting: Emergency Medicine

## 2013-10-23 ENCOUNTER — Emergency Department
Admission: EM | Admit: 2013-10-23 | Discharge: 2013-10-23 | Disposition: A | Discharge status: Home / Self Care | Payer: 59 | Source: Home / Self Care | Attending: Emergency Medicine | Admitting: Emergency Medicine

## 2013-10-23 DIAGNOSIS — A09 Infectious gastroenteritis and colitis, unspecified: Secondary | ICD-10-CM

## 2013-10-23 HISTORY — DX: Migraine, unspecified, not intractable, without status migrainosus: G43.909

## 2013-10-23 HISTORY — DX: Gastro-esophageal reflux disease without esophagitis: K21.9

## 2013-10-23 MED ORDER — PROMETHAZINE HCL 25 MG/ML IJ SOLN
25.0000 mg | INTRAMUSCULAR | Status: AC
  Administered 2013-10-23: 25 mg via INTRAMUSCULAR

## 2013-10-23 MED ORDER — ONDANSETRON 4 MG PO TBDP: 4.0000 mg | ORAL_TABLET | Freq: Three times a day (TID) | ORAL | Status: DC | PRN

## 2013-10-23 MED ORDER — CIPROFLOXACIN HCL 500 MG PO TABS: 500.0000 mg | ORAL_TABLET | Freq: Two times a day (BID) | ORAL | Status: DC

## 2013-10-23 NOTE — ED Notes (Signed)
Pt c/o nausea, vomiting and diarrhea x 1 day. She reports that the vomiting resolved today at 1230. Denies fever.

## 2013-10-23 NOTE — ED Provider Notes (Signed)
CSN: [id number]     Arrival date & time 10/23/13  1704 History   First MD Initiated Contact with Patient 10/23/13 1718     Chief Complaint  Patient presents with  . Nausea  . Emesis  . Diarrhea    HPI Returned from a cruise to the [place] 2 days ago. Then, yesterday at noon, onset of nausea vomiting diarrhea. Tolerating small amount of by mouth clears, but vomiting most everything else. Today, vomited x3. No blood or bile. Just dry heaves now. Today, 4 episodes of loose green watery/semi-formed stool without blood or mucus . No current fever or chills, but may have had this yesterday. No one else is sick at home Has general fatigue and myalgias, but no syncope or lightheadedness. No chest pain or shortness of breath. No GU or GYN symptoms She tried Pepto-Bismol, but was not able to keep it down. Past Medical History  Diagnosis Date  . Hiatal hernia   . GERD (gastroesophageal reflux disease)   . Migraine    Past Surgical History  Procedure Laterality Date  . Cholecystectomy    . Abdominal hysterectomy    . Breast surgery    . Anterior cruciate ligament repair      left   Family History  Problem Relation Age of Onset  . Heart attack Mother   . Hyperlipidemia Mother   . Diabetes Mother   . Heart attack Father   . Hyperlipidemia Father   . Hypertension Father   . Sudden death Neg Hx    History  Substance Use Topics  . Smoking status: Never Smoker   . Smokeless tobacco: Not on file  . Alcohol Use: No   OB History   Grav Para Term Preterm Abortions TAB SAB Ect Mult Living                 Review of Systems  All other systems reviewed and are negative.   Allergies  Erythromycin and Penicillins  Home Medications   Prior to Admission medications   Medication Sig Start Date End Date Taking? Authorizing Provider  pantoprazole (PROTONIX) 40 MG tablet Take 40 mg by mouth daily.   Yes Historical Provider, MD  aspirin 81 MG tablet Take 81 mg by mouth daily.      Historical Provider, MD  calcium-vitamin D (OSCAL WITH D) 500-200 MG-UNIT per tablet Take 1 tablet by mouth at bedtime.      Historical Provider, MD  ciprofloxacin (CIPRO) 500 MG tablet Take 1 tablet (500 mg total) by mouth 2 (two) times daily. For 7 days 10/23/13   Jacqulyn Cane, MD  esomeprazole (NEXIUM) 40 MG capsule Take 40 mg by mouth daily before breakfast.    Historical Provider, MD  estradiol (VIVELLE-DOT) 0.1 MG/24HR Place 1 patch onto the skin 2 (two) times a week. Apply on Friday & Monday     Historical Provider, MD  Multiple Vitamins-Minerals (MULTIVITAMINS THER. W/MINERALS) TABS Take 1 tablet by mouth 3 (three) times daily.      Historical Provider, MD  ondansetron (ZOFRAN-ODT) 4 MG disintegrating tablet Take 1 tablet (4 mg total) by mouth every 8 (eight) hours as needed for nausea. 10/23/13   Jacqulyn Cane, MD   BP 113/72  Pulse 89  Temp(Src) 98.4 F (36.9 C) (Oral)  Resp 18  Ht 5\' 2"  (1.575 m)  Wt 178 lb (80.74 kg)  BMI 32.55 kg/m2  SpO2 100% Physical Exam  Nursing note and vitals reviewed. Constitutional: She is oriented to person, place, and  time. She appears well-developed and well-nourished.  Non-toxic appearance. No distress.  Appears fatigued, but no acute distress. Pleasant, cooperative.  HENT:  Head: Normocephalic and atraumatic.  Nose: Nose normal.  Mouth/Throat: Oropharynx is clear and moist.  Oropharynx clear. Some moisture on mucous membranes. No lesions.  Eyes: Pupils are equal, round, and reactive to light. No scleral icterus.  Neck: Normal range of motion. Neck supple. No JVD present.  Cardiovascular: Normal rate, regular rhythm and normal heart sounds.   No murmur heard. Pulmonary/Chest: Effort normal and breath sounds normal.  Abdominal: Soft. She exhibits no distension, no abdominal bruit and no mass. Bowel sounds are increased. There is no hepatosplenomegaly. There is tenderness (Minimal diffuse tenderness). There is no rebound, no guarding and no CVA  tenderness.  Musculoskeletal: Normal range of motion. She exhibits no edema and no tenderness.  Lymphadenopathy:    She has no cervical adenopathy.  Neurological: She is alert and oriented to person, place, and time.  Skin: No rash noted.  Psychiatric: She has a normal mood and affect.    ED Course  Procedures (including critical care time) Labs Review Labs Reviewed - No data to display  Imaging Review No results found.   MDM   1. Gastroenteritis/colitis, infectious    Workup and Treatment options discussed, as well as risks, benefits, alternatives. She declined any labs or testing today. Patient voiced understanding and agreement with the following plans: Phenergan 25 mg IM stat Prescriptions for Cipro 500 twice a day x7 days and Zofran ODT Push clear liquids and advance as tolerated She declined any anti-cramping medicines such as Levsin. Other symptomatic care discussed. Precautions discussed. Red flags discussed.--Go to emergency room stat if any red flags Questions invited and answered. Patient voiced understanding and agreement. A family member is here to drive patient home      Jacqulyn Cane, MD 10/23/13 1740

## 2013-10-25 ENCOUNTER — Telehealth: Payer: Self-pay | Admitting: *Deleted

## 2013-12-20 ENCOUNTER — Other Ambulatory Visit (HOSPITAL_COMMUNITY): Payer: Self-pay | Admitting: Dentistry

## 2013-12-20 ENCOUNTER — Encounter: Payer: Self-pay | Admitting: Family Medicine

## 2013-12-20 ENCOUNTER — Ambulatory Visit (INDEPENDENT_AMBULATORY_CARE_PROVIDER_SITE_OTHER): Payer: 59 | Admitting: Family Medicine

## 2013-12-20 VITALS — BP 112/73 | HR 83 | Ht 62.0 in | Wt 177.0 lb

## 2013-12-20 DIAGNOSIS — M26639 Articular disc disorder of temporomandibular joint, unspecified side: Secondary | ICD-10-CM

## 2013-12-20 DIAGNOSIS — M722 Plantar fascial fibromatosis: Secondary | ICD-10-CM

## 2013-12-20 DIAGNOSIS — S0300XD Dislocation of jaw, unspecified side, subsequent encounter: Secondary | ICD-10-CM

## 2013-12-20 NOTE — Patient Instructions (Signed)
You have plantar fasciitis Take tylenol or aleve as needed for pain  Plantar fascia stretch for 20-30 seconds (do 3 of these) in morning Lowering/raise on a step exercises 3 x 10 once or twice a day - this is very important for long term recovery. Can add heel walks, toe walks forward and backward as well Ice heel for 15 minutes as needed. Avoid flat shoes/barefoot walking as much as possible. Arch straps have been shown to help with pain. Orthotics with heel lift may be helpful (dr. Zoe Lan active series). Steroid injection is a consideration for short term pain relief if you are struggling. Physical therapy is also an option. Follow up with me in 6 weeks or as needed.

## 2013-12-24 ENCOUNTER — Encounter: Payer: Self-pay | Admitting: Family Medicine

## 2013-12-24 DIAGNOSIS — M722 Plantar fascial fibromatosis: Secondary | ICD-10-CM | POA: Insufficient documentation

## 2013-12-24 NOTE — Progress Notes (Signed)
Patient ID: Kendra Le, female   DOB: 1956/04/13, 58 y.o.   MRN: [id number]  PCP: Shirline Frees, MD  Subjective:   HPI: Patient is a 58 y.o. female here for left heel pain.  Patient denies known injury. Started to get pain in heel past few weeks. Bothers almost exclusively when getting up after prolonged sitting or lying down. Has improved. No swelling or bruising. Has not tried anything to date for this.  Past Medical History  Diagnosis Date  . Hiatal hernia   . GERD (gastroesophageal reflux disease)   . Migraine     Current Outpatient Prescriptions on File Prior to Visit  Medication Sig Dispense Refill  . aspirin 81 MG tablet Take 81 mg by mouth daily.       . calcium-vitamin D (OSCAL WITH D) 500-200 MG-UNIT per tablet Take 1 tablet by mouth at bedtime.        Marland Kitchen esomeprazole (NEXIUM) 40 MG capsule Take 40 mg by mouth daily before breakfast.      . estradiol (VIVELLE-DOT) 0.1 MG/24HR Place 1 patch onto the skin 2 (two) times a week. Apply on Friday & Monday       . Multiple Vitamins-Minerals (MULTIVITAMINS THER. W/MINERALS) TABS Take 1 tablet by mouth 3 (three) times daily.        . ondansetron (ZOFRAN-ODT) 4 MG disintegrating tablet Take 1 tablet (4 mg total) by mouth every 8 (eight) hours as needed for nausea.  15 tablet  0  . pantoprazole (PROTONIX) 40 MG tablet Take 40 mg by mouth daily.       No current facility-administered medications on file prior to visit.    Past Surgical History  Procedure Laterality Date  . Cholecystectomy    . Abdominal hysterectomy    . Breast surgery    . Anterior cruciate ligament repair      left    Allergies  Allergen Reactions  . Erythromycin   . Penicillins Nausea And Vomiting    History   Social History  . Marital Status: Married    Spouse Name: N/A    Number of Children: N/A  . Years of Education: N/A   Occupational History  . Not on file.   Social History Main Topics  . Smoking status: Never Smoker   .  Smokeless tobacco: Not on file  . Alcohol Use: No  . Drug Use: No  . Sexual Activity: Not on file   Other Topics Concern  . Not on file   Social History Narrative  . No narrative on file    Family History  Problem Relation Age of Onset  . Heart attack Mother   . Hyperlipidemia Mother   . Diabetes Mother   . Heart attack Father   . Hyperlipidemia Father   . Hypertension Father   . Sudden death Neg Hx     BP 112/73  Pulse 83  Ht 5\' 2"  (1.575 m)  Wt 177 lb (80.287 kg)  BMI 32.37 kg/m2  Review of Systems: See HPI above.    Objective:  Physical Exam:  Gen: NAD  Left foot/ankle: No gross deformity, swelling, ecchymoses FROM TTP mildly anterior calcaneus at plantar fascia insertion. Negative ant drawer and talar tilt.   Negative syndesmotic compression. Thompsons test negative. NV intact distally.    Assessment & Plan:  1. Left plantar fasciitis - reviewed home exercises, stretches to do daily.  Icing, tylenol/nsaids as needed.  Arch binders, better arch support.  Avoid barefoot walking/flat shoes.  Consider PT, injection if not improving.  F/u in 6 weeks or prn.

## 2013-12-24 NOTE — Assessment & Plan Note (Signed)
reviewed home exercises, stretches to do daily.  Icing, tylenol/nsaids as needed.  Arch binders, better arch support.  Avoid barefoot walking/flat shoes.  Consider PT, injection if not improving.  F/u in 6 weeks or prn.

## 2013-12-29 ENCOUNTER — Encounter: Payer: 59 | Attending: Family Medicine | Admitting: Dietician

## 2013-12-29 DIAGNOSIS — E669 Obesity, unspecified: Secondary | ICD-10-CM | POA: Insufficient documentation

## 2013-12-29 DIAGNOSIS — Z713 Dietary counseling and surveillance: Secondary | ICD-10-CM | POA: Diagnosis not present

## 2013-12-29 NOTE — Progress Notes (Signed)
Patient was seen on 12/29/2013 for the Weight Loss Class at the [facility]. The following learning objectives were met by the patient during this class:   Describe healthy choices in each food group  Describe portion size of foods  Use plate method for meal planning  Demonstrate how to read Nutrition Facts food label  Set realistic goals for weight loss, diet changes, and physical activity.   Goals:  1. Make healthy food choices in each food group.  2. Reduce portion size of foods.  3. Increase fruit and vegetable intake.  4. Use plate method for meal planning.  5. Increase physical activity.   Handouts given:  1. Weight loss tips 2. Meal plan/portion card 2. Plate method  2. Food label handout   

## 2013-12-31 ENCOUNTER — Ambulatory Visit (HOSPITAL_COMMUNITY)
Admission: RE | Admit: 2013-12-31 | Discharge: 2013-12-31 | Disposition: A | Discharge status: Home / Self Care | Payer: 59 | Source: Ambulatory Visit | Attending: Dentistry | Admitting: Dentistry

## 2014-01-02 ENCOUNTER — Ambulatory Visit (HOSPITAL_COMMUNITY)
Admission: RE | Admit: 2014-01-02 | Discharge: 2014-01-02 | Disposition: A | Discharge status: Home / Self Care | Payer: 59 | Source: Ambulatory Visit | Attending: Dentistry | Admitting: Dentistry

## 2014-01-02 DIAGNOSIS — M26639 Articular disc disorder of temporomandibular joint, unspecified side: Secondary | ICD-10-CM

## 2014-01-02 DIAGNOSIS — M26629 Arthralgia of temporomandibular joint, unspecified side: Secondary | ICD-10-CM | POA: Insufficient documentation

## 2014-01-02 DIAGNOSIS — M2669 Other specified disorders of temporomandibular joint: Secondary | ICD-10-CM | POA: Insufficient documentation

## 2014-01-02 DIAGNOSIS — S0300XD Dislocation of jaw, unspecified side, subsequent encounter: Secondary | ICD-10-CM

## 2014-01-15 ENCOUNTER — Ambulatory Visit: Payer: 59 | Admitting: Physical Therapy

## 2014-01-18 ENCOUNTER — Ambulatory Visit: Payer: 59 | Attending: Dentistry | Admitting: Physical Therapy

## 2014-01-18 DIAGNOSIS — R6884 Jaw pain: Secondary | ICD-10-CM | POA: Insufficient documentation

## 2014-01-18 DIAGNOSIS — M629 Disorder of muscle, unspecified: Secondary | ICD-10-CM | POA: Insufficient documentation

## 2014-01-18 DIAGNOSIS — M2669 Other specified disorders of temporomandibular joint: Secondary | ICD-10-CM | POA: Diagnosis not present

## 2014-01-18 DIAGNOSIS — IMO0001 Reserved for inherently not codable concepts without codable children: Secondary | ICD-10-CM | POA: Diagnosis present

## 2014-01-18 DIAGNOSIS — R5381 Other malaise: Secondary | ICD-10-CM | POA: Insufficient documentation

## 2014-01-18 DIAGNOSIS — M542 Cervicalgia: Secondary | ICD-10-CM | POA: Insufficient documentation

## 2014-01-18 DIAGNOSIS — M26639 Articular disc disorder of temporomandibular joint, unspecified side: Secondary | ICD-10-CM | POA: Insufficient documentation

## 2014-01-18 DIAGNOSIS — M9981 Other biomechanical lesions of cervical region: Secondary | ICD-10-CM | POA: Diagnosis not present

## 2014-01-18 DIAGNOSIS — G44219 Episodic tension-type headache, not intractable: Secondary | ICD-10-CM | POA: Insufficient documentation

## 2014-01-18 DIAGNOSIS — M2420 Disorder of ligament, unspecified site: Secondary | ICD-10-CM | POA: Insufficient documentation

## 2014-01-22 ENCOUNTER — Ambulatory Visit: Payer: 59 | Attending: Dentistry | Admitting: Physical Therapy

## 2014-01-22 DIAGNOSIS — R5381 Other malaise: Secondary | ICD-10-CM | POA: Insufficient documentation

## 2014-01-22 DIAGNOSIS — M9981 Other biomechanical lesions of cervical region: Secondary | ICD-10-CM | POA: Diagnosis not present

## 2014-01-22 DIAGNOSIS — IMO0001 Reserved for inherently not codable concepts without codable children: Secondary | ICD-10-CM | POA: Insufficient documentation

## 2014-01-22 DIAGNOSIS — M2669 Other specified disorders of temporomandibular joint: Secondary | ICD-10-CM | POA: Insufficient documentation

## 2014-01-22 DIAGNOSIS — M542 Cervicalgia: Secondary | ICD-10-CM | POA: Insufficient documentation

## 2014-01-22 DIAGNOSIS — M2420 Disorder of ligament, unspecified site: Secondary | ICD-10-CM | POA: Insufficient documentation

## 2014-01-22 DIAGNOSIS — R6884 Jaw pain: Secondary | ICD-10-CM | POA: Diagnosis not present

## 2014-01-22 DIAGNOSIS — G44219 Episodic tension-type headache, not intractable: Secondary | ICD-10-CM | POA: Insufficient documentation

## 2014-01-22 DIAGNOSIS — M629 Disorder of muscle, unspecified: Secondary | ICD-10-CM | POA: Insufficient documentation

## 2014-01-22 DIAGNOSIS — M26639 Articular disc disorder of temporomandibular joint, unspecified side: Secondary | ICD-10-CM | POA: Diagnosis not present

## 2014-01-24 ENCOUNTER — Ambulatory Visit: Payer: 59 | Admitting: Physical Therapy

## 2014-01-24 DIAGNOSIS — IMO0001 Reserved for inherently not codable concepts without codable children: Secondary | ICD-10-CM | POA: Diagnosis not present

## 2014-01-29 ENCOUNTER — Ambulatory Visit: Payer: 59 | Admitting: Physical Therapy

## 2014-01-29 DIAGNOSIS — IMO0001 Reserved for inherently not codable concepts without codable children: Secondary | ICD-10-CM | POA: Diagnosis not present

## 2014-02-01 ENCOUNTER — Ambulatory Visit: Payer: 59 | Admitting: Physical Therapy

## 2014-02-01 DIAGNOSIS — IMO0001 Reserved for inherently not codable concepts without codable children: Secondary | ICD-10-CM | POA: Diagnosis not present

## 2014-02-04 ENCOUNTER — Ambulatory Visit: Payer: 59 | Admitting: Physical Therapy

## 2014-02-05 ENCOUNTER — Ambulatory Visit: Payer: 59 | Admitting: Physical Therapy

## 2014-02-05 DIAGNOSIS — IMO0001 Reserved for inherently not codable concepts without codable children: Secondary | ICD-10-CM | POA: Diagnosis not present

## 2014-02-13 ENCOUNTER — Ambulatory Visit: Payer: 59 | Admitting: Physical Therapy

## 2014-02-13 DIAGNOSIS — IMO0001 Reserved for inherently not codable concepts without codable children: Secondary | ICD-10-CM | POA: Diagnosis not present

## 2014-02-14 ENCOUNTER — Ambulatory Visit: Payer: 59 | Admitting: Physical Therapy

## 2014-02-14 DIAGNOSIS — IMO0001 Reserved for inherently not codable concepts without codable children: Secondary | ICD-10-CM | POA: Diagnosis not present

## 2014-02-15 ENCOUNTER — Encounter: Payer: 59 | Admitting: Physical Therapy

## 2014-02-20 ENCOUNTER — Ambulatory Visit: Payer: 59 | Attending: Dentistry | Admitting: Physical Therapy

## 2014-02-20 DIAGNOSIS — M2669 Other specified disorders of temporomandibular joint: Secondary | ICD-10-CM | POA: Insufficient documentation

## 2014-02-20 DIAGNOSIS — M542 Cervicalgia: Secondary | ICD-10-CM | POA: Insufficient documentation

## 2014-02-20 DIAGNOSIS — G44219 Episodic tension-type headache, not intractable: Secondary | ICD-10-CM | POA: Diagnosis not present

## 2014-02-20 DIAGNOSIS — M9981 Other biomechanical lesions of cervical region: Secondary | ICD-10-CM | POA: Diagnosis not present

## 2014-02-20 DIAGNOSIS — R5381 Other malaise: Secondary | ICD-10-CM | POA: Insufficient documentation

## 2014-02-20 DIAGNOSIS — M629 Disorder of muscle, unspecified: Secondary | ICD-10-CM | POA: Insufficient documentation

## 2014-02-20 DIAGNOSIS — IMO0001 Reserved for inherently not codable concepts without codable children: Secondary | ICD-10-CM | POA: Insufficient documentation

## 2014-02-20 DIAGNOSIS — M2420 Disorder of ligament, unspecified site: Secondary | ICD-10-CM | POA: Insufficient documentation

## 2014-02-20 DIAGNOSIS — R6884 Jaw pain: Secondary | ICD-10-CM | POA: Insufficient documentation

## 2014-02-20 DIAGNOSIS — M26639 Articular disc disorder of temporomandibular joint, unspecified side: Secondary | ICD-10-CM | POA: Insufficient documentation

## 2014-02-26 ENCOUNTER — Ambulatory Visit: Payer: 59 | Admitting: Physical Therapy

## 2014-02-26 DIAGNOSIS — IMO0001 Reserved for inherently not codable concepts without codable children: Secondary | ICD-10-CM | POA: Diagnosis not present

## 2014-03-05 ENCOUNTER — Ambulatory Visit: Payer: 59 | Admitting: Physical Therapy

## 2014-03-05 DIAGNOSIS — IMO0001 Reserved for inherently not codable concepts without codable children: Secondary | ICD-10-CM | POA: Diagnosis not present

## 2014-03-07 ENCOUNTER — Encounter: Payer: 59 | Admitting: Physical Therapy

## 2014-03-11 ENCOUNTER — Ambulatory Visit: Payer: 59 | Admitting: Physical Therapy

## 2014-03-11 DIAGNOSIS — IMO0001 Reserved for inherently not codable concepts without codable children: Secondary | ICD-10-CM | POA: Diagnosis not present

## 2014-03-12 ENCOUNTER — Ambulatory Visit: Payer: 59 | Admitting: Physical Therapy

## 2014-03-12 DIAGNOSIS — IMO0001 Reserved for inherently not codable concepts without codable children: Secondary | ICD-10-CM | POA: Diagnosis not present

## 2014-03-26 ENCOUNTER — Ambulatory Visit: Payer: 59 | Attending: Dentistry | Admitting: Physical Therapy

## 2014-03-26 DIAGNOSIS — Z90710 Acquired absence of both cervix and uterus: Secondary | ICD-10-CM | POA: Insufficient documentation

## 2014-03-26 DIAGNOSIS — M2660 Temporomandibular joint disorder, unspecified: Secondary | ICD-10-CM | POA: Diagnosis not present

## 2014-03-26 DIAGNOSIS — M542 Cervicalgia: Secondary | ICD-10-CM | POA: Insufficient documentation

## 2014-03-26 DIAGNOSIS — R5381 Other malaise: Secondary | ICD-10-CM | POA: Insufficient documentation

## 2014-03-26 DIAGNOSIS — Z9889 Other specified postprocedural states: Secondary | ICD-10-CM | POA: Diagnosis not present

## 2014-06-06 ENCOUNTER — Other Ambulatory Visit: Payer: Self-pay | Admitting: Obstetrics and Gynecology

## 2014-06-10 LAB — CYTOLOGY - PAP

## 2014-06-26 ENCOUNTER — Other Ambulatory Visit: Payer: Self-pay | Admitting: Physician Assistant

## 2014-10-21 ENCOUNTER — Encounter: Payer: Self-pay | Admitting: Family Medicine

## 2014-10-21 ENCOUNTER — Ambulatory Visit (INDEPENDENT_AMBULATORY_CARE_PROVIDER_SITE_OTHER): Payer: 59 | Admitting: Family Medicine

## 2014-10-21 VITALS — BP 113/76 | HR 74 | Ht 62.0 in | Wt 168.0 lb

## 2014-10-21 DIAGNOSIS — M25552 Pain in left hip: Secondary | ICD-10-CM

## 2014-10-21 DIAGNOSIS — M7061 Trochanteric bursitis, right hip: Secondary | ICD-10-CM | POA: Diagnosis not present

## 2014-10-21 DIAGNOSIS — M25551 Pain in right hip: Secondary | ICD-10-CM | POA: Diagnosis not present

## 2014-10-21 DIAGNOSIS — M7062 Trochanteric bursitis, left hip: Secondary | ICD-10-CM | POA: Diagnosis not present

## 2014-10-21 MED ORDER — METHYLPREDNISOLONE ACETATE 40 MG/ML IJ SUSP
40.0000 mg | Freq: Once | INTRAMUSCULAR | Status: AC
  Administered 2014-10-21: 40 mg via INTRA_ARTICULAR

## 2014-10-21 NOTE — Patient Instructions (Signed)
You have trochanteric bursitis and IT band syndrome Avoid painful activities as much as possible. Ice over area of pain 3-4 times a day for 15 minutes at a time Hip side raise exercise 3 sets of 10-15 once a day - add weights if this becomes too easy. Stretches - pick 2 and hold for 20-30 seconds x 3 - do once or twice a day. Tylenol and/or aleve as needed for pain. You were given a cortisone shot today. If not improving, can consider physical therapy. Follow up with me in 5-6 weeks.

## 2014-10-22 DIAGNOSIS — M7061 Trochanteric bursitis, right hip: Secondary | ICD-10-CM | POA: Insufficient documentation

## 2014-10-22 DIAGNOSIS — M7062 Trochanteric bursitis, left hip: Secondary | ICD-10-CM

## 2014-10-22 NOTE — Assessment & Plan Note (Signed)
with IT band syndrome - Shown home exercises to do daily.  Icing, tylenol/aleve if needed.  Injections given today as well - tolerated without any problems.  F/u in 5-6 weeks.  Consider PT if not improving.  After informed written consent patient was lying on exam table.  Area overlying left trochanteric bursa prepped with alcohol swab then injected with 6:2 marcaine: depomedrol.  Patient tolerated procedure well without immediate complications.  After informed written consent patient was lying on exam table.  Area overlying right trochanteric bursa prepped with alcohol swab then injected with 6:2 marcaine: depomedrol.  Patient tolerated procedure well without immediate complications.

## 2014-10-22 NOTE — Progress Notes (Signed)
PCP: Shirline Frees, MD  Subjective:   HPI: Patient is a 59 y.o. female here for bilateral hip pain..  Patient reports for about 2 years she's had pain in bilateral hips laterally but worse recently. Difficulty to sleep on either side. Pain on right 4/10, left 3/10. Limping due to pain. Had an injection once but was a terrible experience, very painful - did not help. Not doing any specific rehab for this. Occasional back soreness but nothing consistent. No radiation. No numbness/tingling.  Past Medical History  Diagnosis Date  . Hiatal hernia   . GERD (gastroesophageal reflux disease)   . Migraine     Current Outpatient Prescriptions on File Prior to Visit  Medication Sig Dispense Refill  . aspirin 81 MG tablet Take 81 mg by mouth daily.     . calcium-vitamin D (OSCAL WITH D) 500-200 MG-UNIT per tablet Take 1 tablet by mouth at bedtime.      Marland Kitchen esomeprazole (NEXIUM) 40 MG capsule Take 40 mg by mouth daily before breakfast.    . estradiol (VIVELLE-DOT) 0.1 MG/24HR Place 1 patch onto the skin 2 (two) times a week. Apply on Friday & Monday     . Multiple Vitamins-Minerals (MULTIVITAMINS THER. W/MINERALS) TABS Take 1 tablet by mouth 3 (three) times daily.      . ondansetron (ZOFRAN-ODT) 4 MG disintegrating tablet Take 1 tablet (4 mg total) by mouth every 8 (eight) hours as needed for nausea. 15 tablet 0  . pantoprazole (PROTONIX) 40 MG tablet Take 40 mg by mouth daily.     No current facility-administered medications on file prior to visit.    Past Surgical History  Procedure Laterality Date  . Cholecystectomy    . Abdominal hysterectomy    . Breast surgery    . Anterior cruciate ligament repair      left    Allergies  Allergen Reactions  . Erythromycin   . Penicillins Nausea And Vomiting    History   Social History  . Marital Status: Married    Spouse Name: N/A  . Number of Children: N/A  . Years of Education: N/A   Occupational History  . Not on file.    Social History Main Topics  . Smoking status: Never Smoker   . Smokeless tobacco: Not on file  . Alcohol Use: No  . Drug Use: No  . Sexual Activity: Not on file   Other Topics Concern  . Not on file   Social History Narrative    Family History  Problem Relation Age of Onset  . Heart attack Mother   . Hyperlipidemia Mother   . Diabetes Mother   . Heart attack Father   . Hyperlipidemia Father   . Hypertension Father   . Sudden death Neg Hx     BP 113/76 mmHg  Pulse 74  Ht 5\' 2"  (1.575 m)  Wt 168 lb (76.204 kg)  BMI 30.72 kg/m2  Review of Systems: See HPI above.    Objective:  Physical Exam:  Gen: NAD  Back/bilateral hips: No gross deformity, scoliosis. TTP bilateral trochanters.  No midline or bony TTP. FROM hips without pain. Strength LEs 5/5 all muscle groups except 5-/5 hip abduction left, 4/5 on right. Negative SLRs. Sensation intact to light touch bilaterally. Negative logroll bilateral hips Negative fabers and piriformis stretches.    Assessment & Plan:  1. Bilateral trochanteric bursitis - with IT band syndrome - Shown home exercises to do daily.  Icing, tylenol/aleve if needed.  Injections given  today as well - tolerated without any problems.  F/u in 5-6 weeks.  Consider PT if not improving.  After informed written consent patient was lying on exam table.  Area overlying left trochanteric bursa prepped with alcohol swab then injected with 6:2 marcaine: depomedrol.  Patient tolerated procedure well without immediate complications.  After informed written consent patient was lying on exam table.  Area overlying right trochanteric bursa prepped with alcohol swab then injected with 6:2 marcaine: depomedrol.  Patient tolerated procedure well without immediate complications.

## 2015-07-16 MED FILL — ESTRADIOL 0.025 MG PATCH: 0.025 | 84 days supply | Qty: 24 | Fill #1

## 2015-07-25 MED FILL — PANTOPRAZOLE SOD DR 40 MG T: 40 | 90 days supply | Qty: 90 | Fill #3

## 2015-07-29 DIAGNOSIS — M5442 Lumbago with sciatica, left side: Secondary | ICD-10-CM | POA: Diagnosis not present

## 2015-07-29 MED FILL — CYCLOBENZAPRINE 10 MG TAB: 10 | 15 days supply | Qty: 45 | Fill #0

## 2015-08-05 ENCOUNTER — Encounter: Payer: Self-pay | Admitting: Family Medicine

## 2015-08-05 ENCOUNTER — Ambulatory Visit (INDEPENDENT_AMBULATORY_CARE_PROVIDER_SITE_OTHER): Payer: 59 | Admitting: Family Medicine

## 2015-08-05 VITALS — BP 137/83 | HR 76 | Ht 62.0 in | Wt 170.0 lb

## 2015-08-05 DIAGNOSIS — M7130 Other bursal cyst, unspecified site: Secondary | ICD-10-CM | POA: Diagnosis not present

## 2015-08-05 NOTE — Patient Instructions (Signed)
You have a synovial cyst - 1 cm at largest diameter. We will start with conservative treatment - compression with arch binder - wear as often as possible. Icing 15 minutes at a time 3-4 times a day. Elevate above your heart when possible. Consider a night splint sock (called the [place] sock) when you sleep commonly used for plantar fasciitis - I suspect when you keep foot pointed downwards for extended period when you sleep it causes more pain and you wake up. Aleve 2 tabs twice a day with food OR ibuprofen 600mg  three times a day with food for pain and inflammation for 7-10 days then as needed. Call me if you want to try aspiration/injection of this if not improving.

## 2015-08-06 ENCOUNTER — Other Ambulatory Visit: Payer: Self-pay | Admitting: Physician Assistant

## 2015-08-06 DIAGNOSIS — D2262 Melanocytic nevi of left upper limb, including shoulder: Secondary | ICD-10-CM | POA: Diagnosis not present

## 2015-08-06 DIAGNOSIS — D485 Neoplasm of uncertain behavior of skin: Secondary | ICD-10-CM | POA: Diagnosis not present

## 2015-08-06 NOTE — Progress Notes (Signed)
PCP: Shirline Frees, MD  Subjective:   HPI: Patient is a 60 y.o. female here for left foot pain.  Patient reports having about 1 month of swelling dorsal proximal left foot with pain. Pain 0/10 at rest and during day - up to 10/10 at night in swollen location, throbbing. No known injury or trauma. No skin changes, fever, drainage.  Past Medical History  Diagnosis Date  . Hiatal hernia   . GERD (gastroesophageal reflux disease)   . Migraine     Current Outpatient Prescriptions on File Prior to Visit  Medication Sig Dispense Refill  . aspirin 81 MG tablet Take 81 mg by mouth daily.     . calcium-vitamin D (OSCAL WITH D) 500-200 MG-UNIT per tablet Take 1 tablet by mouth at bedtime.      Marland Kitchen esomeprazole (NEXIUM) 40 MG capsule Take 40 mg by mouth daily before breakfast.    . Multiple Vitamins-Minerals (MULTIVITAMINS THER. W/MINERALS) TABS Take 1 tablet by mouth 3 (three) times daily.      . pantoprazole (PROTONIX) 40 MG tablet Take 40 mg by mouth daily.     No current facility-administered medications on file prior to visit.    Past Surgical History  Procedure Laterality Date  . Cholecystectomy    . Abdominal hysterectomy    . Breast surgery    . Anterior cruciate ligament repair      left    Allergies  Allergen Reactions  . Erythromycin   . Penicillins Nausea And Vomiting    Social History   Social History  . Marital Status: Married    Spouse Name: N/A  . Number of Children: N/A  . Years of Education: N/A   Occupational History  . Not on file.   Social History Main Topics  . Smoking status: Never Smoker   . Smokeless tobacco: Not on file  . Alcohol Use: No  . Drug Use: No  . Sexual Activity: Not on file   Other Topics Concern  . Not on file   Social History Narrative    Family History  Problem Relation Age of Onset  . Heart attack Mother   . Hyperlipidemia Mother   . Diabetes Mother   . Heart attack Father   . Hyperlipidemia Father   .  Hypertension Father   . Sudden death Neg Hx     BP 137/83 mmHg  Pulse 76  Ht 5\' 2"  (1.575 m)  Wt 170 lb (77.111 kg)  BMI 31.09 kg/m2  Review of Systems: See HPI above.    Objective:  Physical Exam:  Gen: NAD  Left foot/ankle: Focal swelling, mobile mass dorsal proximal left foot.  No erythema, other deformity. No tenderness to palpation of the mass or elsewhere foot/ankle. TTP Negative ant drawer and talar tilt.   Negative syndesmotic compression. Thompsons test negative. NV intact distally.  Right foot/ankle: FROM without pain, swelling.  [facility] u/s left foot: Simple hypoechoic mass proximal foot consistent with synovial cyst approximately 1cm in largest diameter proximal to distal.  No surrounding neovascularity.    Assessment & Plan:  1. Left proximal foot synovial cyst - Performed and independently reviewed [facility] ultrasound.  Reassured patient.  No evidence infection.  Recommended conservative treatment - arch binder, icing, elevation.  Consider night splint sock as seems to bother her more at night when sleeping with toes plantarflexed.  NSAIDs for 7-10 days then as needed.  Consider aspiration/injection if not improving and this still bothers her.

## 2015-08-07 DIAGNOSIS — M7130 Other bursal cyst, unspecified site: Secondary | ICD-10-CM | POA: Insufficient documentation

## 2015-08-07 NOTE — Assessment & Plan Note (Signed)
Performed and independently reviewed [facility] ultrasound.  Reassured patient.  No evidence infection.  Recommended conservative treatment - arch binder, icing, elevation.  Consider night splint sock as seems to bother her more at night when sleeping with toes plantarflexed.  NSAIDs for 7-10 days then as needed.  Consider aspiration/injection if not improving and this still bothers her.

## 2015-08-29 ENCOUNTER — Ambulatory Visit (INDEPENDENT_AMBULATORY_CARE_PROVIDER_SITE_OTHER): Payer: 59 | Admitting: Family Medicine

## 2015-08-29 ENCOUNTER — Encounter: Payer: Self-pay | Admitting: Family Medicine

## 2015-08-29 VITALS — BP 111/76 | Ht 62.0 in | Wt 170.0 lb

## 2015-08-29 DIAGNOSIS — M7130 Other bursal cyst, unspecified site: Secondary | ICD-10-CM

## 2015-08-29 DIAGNOSIS — M79672 Pain in left foot: Secondary | ICD-10-CM | POA: Diagnosis not present

## 2015-08-29 MED ORDER — METHYLPREDNISOLONE ACETATE 40 MG/ML IJ SUSP
20.0000 mg | Freq: Once | INTRAMUSCULAR | Status: AC
  Administered 2015-08-29: 20 mg via INTRA_ARTICULAR

## 2015-09-01 ENCOUNTER — Encounter: Payer: Self-pay | Admitting: Family Medicine

## 2015-09-01 NOTE — Progress Notes (Signed)
PCP: Shirline Frees, MD  Subjective:   HPI: Patient is a 60 y.o. female here for left foot pain.  2/14: Patient reports having about 1 month of swelling dorsal proximal left foot with pain. Pain 0/10 at rest and during day - up to 10/10 at night in swollen location, throbbing. No known injury or trauma. No skin changes, fever, drainage.  3/10: Patient returns to have cyst of left foot aspirated and injected.  Past Medical History  Diagnosis Date  . Hiatal hernia   . GERD (gastroesophageal reflux disease)   . Migraine     Current Outpatient Prescriptions on File Prior to Visit  Medication Sig Dispense Refill  . aspirin 81 MG tablet Take 81 mg by mouth daily.     . calcium-vitamin D (OSCAL WITH D) 500-200 MG-UNIT per tablet Take 1 tablet by mouth at bedtime.      Marland Kitchen esomeprazole (NEXIUM) 40 MG capsule Take 40 mg by mouth daily before breakfast.    . estradiol (VIVELLE-DOT) 0.025 MG/24HR   12  . Multiple Vitamins-Minerals (MULTIVITAMINS THER. W/MINERALS) TABS Take 1 tablet by mouth 3 (three) times daily.      . pantoprazole (PROTONIX) 40 MG tablet Take 40 mg by mouth daily.     No current facility-administered medications on file prior to visit.    Past Surgical History  Procedure Laterality Date  . Cholecystectomy    . Abdominal hysterectomy    . Breast surgery    . Anterior cruciate ligament repair      left    Allergies  Allergen Reactions  . Erythromycin   . Penicillins Nausea And Vomiting    Social History   Social History  . Marital Status: Married    Spouse Name: N/A  . Number of Children: N/A  . Years of Education: N/A   Occupational History  . Not on file.   Social History Main Topics  . Smoking status: Never Smoker   . Smokeless tobacco: Not on file  . Alcohol Use: No  . Drug Use: No  . Sexual Activity: Not on file   Other Topics Concern  . Not on file   Social History Narrative    Family History  Problem Relation Age of Onset  .  Heart attack Mother   . Hyperlipidemia Mother   . Diabetes Mother   . Heart attack Father   . Hyperlipidemia Father   . Hypertension Father   . Sudden death Neg Hx     BP 111/76 mmHg  Ht 5\' 2"  (1.575 m)  Wt 170 lb (77.111 kg)  BMI 31.09 kg/m2  Review of Systems: See HPI above.    Objective:  Physical Exam:  Gen: NAD  Left foot/ankle: Focal swelling, mobile mass dorsal proximal left foot.  No erythema, other deformity. No tenderness to palpation of the mass or elsewhere foot/ankle. TTP Negative ant drawer and talar tilt.   Negative syndesmotic compression. Thompsons test negative. NV intact distally.  Right foot/ankle: FROM without pain, swelling.  [facility] u/s left foot: Again confirmed simple hypoechoic mass proximal foot consistent with synovial cyst approximately 1cm in largest diameter proximal to distal.  No surrounding neovascularity.    Assessment & Plan:  1. Left proximal foot synovial cyst - Went ahead with aspiration and injection today.  Icing, elevation, compression.  Follow up as needed.  After informed written consent patient was seated on exam table.  Ultrasound used to identify vascular structures, alcohol swab used to prep away from dorsalis pedis,  0.62mL marcaine used for local anesthesia.  Then using 18g needle, 2 mL gelatinous fluid aspirated from left foot proximal synovial cyst then injected with 0.5:0.86mL marcaine: depomedrol.  Patient tolerated procedure well without immediate complications.

## 2015-09-02 NOTE — Assessment & Plan Note (Signed)
Left proximal foot synovial cyst - Went ahead with aspiration and injection today.  Icing, elevation, compression.  Follow up as needed.  After informed written consent patient was seated on exam table.  Ultrasound used to identify vascular structures, alcohol swab used to prep away from dorsalis pedis, 0.92mL marcaine used for local anesthesia.  Then using 18g needle, 2 mL gelatinous fluid aspirated from left foot proximal synovial cyst then injected with 0.5:0.53mL marcaine: depomedrol.  Patient tolerated procedure well without immediate complications.

## 2015-10-03 MED FILL — ESTRADIOL 0.025 MG PATCH: 0.025 | 84 days supply | Qty: 24 | Fill #2

## 2015-10-10 DIAGNOSIS — H524 Presbyopia: Secondary | ICD-10-CM | POA: Diagnosis not present

## 2015-10-20 MED FILL — PANTOPRAZOLE SOD DR 40 MG T: 40 | 90 days supply | Qty: 90 | Fill #4

## 2015-11-12 ENCOUNTER — Ambulatory Visit (INDEPENDENT_AMBULATORY_CARE_PROVIDER_SITE_OTHER): Payer: 59 | Admitting: Family Medicine

## 2015-11-12 ENCOUNTER — Encounter: Payer: Self-pay | Admitting: Family Medicine

## 2015-11-12 VITALS — BP 112/73 | HR 81 | Ht 62.0 in

## 2015-11-12 DIAGNOSIS — M7130 Other bursal cyst, unspecified site: Secondary | ICD-10-CM

## 2015-11-12 MED ORDER — METHYLPREDNISOLONE ACETATE 40 MG/ML IJ SUSP
40.0000 mg | Freq: Once | INTRAMUSCULAR | Status: AC
  Administered 2015-11-12: 20 mg via INTRA_ARTICULAR

## 2015-11-12 NOTE — Patient Instructions (Signed)
We drained and injected your synovial cyst.   Can ice, compress, elevate. Activities as tolerated.  If this recurs typically I would go ahead with referral to discuss surgical removal.

## 2015-11-13 NOTE — Assessment & Plan Note (Signed)
Left proximal foot synovial cyst - Went ahead with repeated aspiration and injection today.  Icing, elevation, compression.  Follow up as needed.  After informed written consent patient was seated on exam table.  Ultrasound used to identify vascular structures, alcohol swab used to prep away from dorsalis pedis, 54mL marcaine used for local anesthesia.  Then using 18g needle, 3.5 mL gelatinous fluid aspirated from left foot proximal synovial cyst then injected with 0.5:0.44mL marcaine: depomedrol.  Patient tolerated procedure well without immediate complications.

## 2015-11-13 NOTE — Progress Notes (Signed)
PCP: Shirline Frees, MD  Subjective:   HPI: Patient is a 60 y.o. female here for left foot pain.  2/14: Patient reports having about 1 month of swelling dorsal proximal left foot with pain. Pain 0/10 at rest and during day - up to 10/10 at night in swollen location, throbbing. No known injury or trauma. No skin changes, fever, drainage.  3/10: Patient returns to have cyst of left foot aspirated and injected.  5/24: Patient reports she has done well until the past couple weeks. Cyst appears to have reaccumulated and causing pain at 2/10 worse at night, better during the day dorsal left foot. Pain is dull but can be throbbing at night. No skin changes, numbness.  Past Medical History  Diagnosis Date  . Hiatal hernia   . GERD (gastroesophageal reflux disease)   . Migraine     Current Outpatient Prescriptions on File Prior to Visit  Medication Sig Dispense Refill  . aspirin 81 MG tablet Take 81 mg by mouth daily.     . calcium-vitamin D (OSCAL WITH D) 500-200 MG-UNIT per tablet Take 1 tablet by mouth at bedtime.      Marland Kitchen esomeprazole (NEXIUM) 40 MG capsule Take 40 mg by mouth daily before breakfast.    . estradiol (VIVELLE-DOT) 0.025 MG/24HR   12  . Multiple Vitamins-Minerals (MULTIVITAMINS THER. W/MINERALS) TABS Take 1 tablet by mouth 3 (three) times daily.      . pantoprazole (PROTONIX) 40 MG tablet Take 40 mg by mouth daily.     No current facility-administered medications on file prior to visit.    Past Surgical History  Procedure Laterality Date  . Cholecystectomy    . Abdominal hysterectomy    . Breast surgery    . Anterior cruciate ligament repair      left    Allergies  Allergen Reactions  . Erythromycin   . Penicillins Nausea And Vomiting    Social History   Social History  . Marital Status: Married    Spouse Name: N/A  . Number of Children: N/A  . Years of Education: N/A   Occupational History  . Not on file.   Social History Main Topics  .  Smoking status: Never Smoker   . Smokeless tobacco: Not on file  . Alcohol Use: No  . Drug Use: No  . Sexual Activity: Not on file   Other Topics Concern  . Not on file   Social History Narrative    Family History  Problem Relation Age of Onset  . Heart attack Mother   . Hyperlipidemia Mother   . Diabetes Mother   . Heart attack Father   . Hyperlipidemia Father   . Hypertension Father   . Sudden death Neg Hx     BP 112/73 mmHg  Pulse 81  Ht 5\' 2"  (1.575 m)  Review of Systems: See HPI above.    Objective:  Physical Exam:  Gen: NAD  Left foot/ankle: Focal swelling, mobile mass dorsal proximal left foot.  No erythema, other deformity. No tenderness to palpation of the mass or elsewhere foot/ankle. TTP Negative ant drawer and talar tilt.   Negative syndesmotic compression. Thompsons test negative. NV intact distally.  Right foot/ankle: FROM without pain, swelling.    Assessment & Plan:  1. Left proximal foot synovial cyst - Went ahead with repeated aspiration and injection today.  Icing, elevation, compression.  Follow up as needed.  After informed written consent patient was seated on exam table.  Ultrasound used to  identify vascular structures, alcohol swab used to prep away from dorsalis pedis, 75mL marcaine used for local anesthesia.  Then using 18g needle, 3.5 mL gelatinous fluid aspirated from left foot proximal synovial cyst then injected with 0.5:0.35mL marcaine: depomedrol.  Patient tolerated procedure well without immediate complications.

## 2015-12-17 MED FILL — METHYLPREDNISOLONE 4 MG TAB: 4 | 6 days supply | Qty: 21 | Fill #0

## 2015-12-17 MED FILL — AZITHROMYCIN 250 MG TABLET: 250 | 5 days supply | Qty: 6 | Fill #0

## 2015-12-17 MED FILL — BENZONATATE 100 MG CAPSULE: 100 | 7 days supply | Qty: 21 | Fill #0

## 2015-12-22 MED FILL — ESTRADIOL 0.025 MG PATCH: 0.025 | 84 days supply | Qty: 24 | Fill #3

## 2016-02-11 DIAGNOSIS — L570 Actinic keratosis: Secondary | ICD-10-CM | POA: Diagnosis not present

## 2016-02-11 DIAGNOSIS — D239 Other benign neoplasm of skin, unspecified: Secondary | ICD-10-CM | POA: Diagnosis not present

## 2016-03-11 MED FILL — ESTRADIOL 0.025 MG PATCH: 0.025 | 84 days supply | Qty: 24 | Fill #4

## 2016-03-30 DIAGNOSIS — Z1211 Encounter for screening for malignant neoplasm of colon: Secondary | ICD-10-CM | POA: Diagnosis not present

## 2016-03-30 DIAGNOSIS — E669 Obesity, unspecified: Secondary | ICD-10-CM | POA: Diagnosis not present

## 2016-03-30 DIAGNOSIS — K219 Gastro-esophageal reflux disease without esophagitis: Secondary | ICD-10-CM | POA: Diagnosis not present

## 2016-03-31 MED FILL — PANTOPRAZOLE SOD DR 40 MG T: 40 | 90 days supply | Qty: 90 | Fill #0

## 2016-05-12 ENCOUNTER — Telehealth: Payer: 59 | Admitting: Nurse Practitioner

## 2016-05-12 DIAGNOSIS — J0101 Acute recurrent maxillary sinusitis: Secondary | ICD-10-CM

## 2016-05-12 MED ORDER — DOXYCYCLINE HYCLATE 100 MG PO TABS: 100.0000 mg | ORAL_TABLET | Freq: Two times a day (BID) | ORAL | 0 refills | Status: DC

## 2016-05-12 MED FILL — DOXYCYCLINE HYCLATE 100 MG: 100 | 10 days supply | Qty: 20 | Fill #0

## 2016-05-12 NOTE — Progress Notes (Signed)

## 2016-06-12 DIAGNOSIS — H6093 Unspecified otitis externa, bilateral: Secondary | ICD-10-CM | POA: Diagnosis not present

## 2016-06-12 DIAGNOSIS — H6123 Impacted cerumen, bilateral: Secondary | ICD-10-CM | POA: Diagnosis not present

## 2016-06-17 DIAGNOSIS — Z1231 Encounter for screening mammogram for malignant neoplasm of breast: Secondary | ICD-10-CM | POA: Diagnosis not present

## 2016-06-17 DIAGNOSIS — Z01419 Encounter for gynecological examination (general) (routine) without abnormal findings: Secondary | ICD-10-CM | POA: Diagnosis not present

## 2016-06-17 DIAGNOSIS — Z6832 Body mass index (BMI) 32.0-32.9, adult: Secondary | ICD-10-CM | POA: Diagnosis not present

## 2016-06-17 MED FILL — ESTRADIOL 0.025 MG PATCH: 0.025 | 84 days supply | Qty: 24 | Fill #0

## 2016-06-22 MED FILL — AMOXICILLIN 500 MG CAPSULE: 500 | 7 days supply | Qty: 21 | Fill #0

## 2016-06-25 MED FILL — PANTOPRAZOLE SOD DR 40 MG T: 40 | 90 days supply | Qty: 90 | Fill #1

## 2016-06-29 MED FILL — GAVILYTE-G SOLUTION: 236 | 1 days supply | Qty: 4000 | Fill #0

## 2016-07-15 ENCOUNTER — Other Ambulatory Visit: Payer: Self-pay | Admitting: Physician Assistant

## 2016-07-15 DIAGNOSIS — D229 Melanocytic nevi, unspecified: Secondary | ICD-10-CM | POA: Diagnosis not present

## 2016-07-15 DIAGNOSIS — D492 Neoplasm of unspecified behavior of bone, soft tissue, and skin: Secondary | ICD-10-CM | POA: Diagnosis not present

## 2016-07-15 DIAGNOSIS — Z85828 Personal history of other malignant neoplasm of skin: Secondary | ICD-10-CM | POA: Diagnosis not present

## 2016-07-15 DIAGNOSIS — L821 Other seborrheic keratosis: Secondary | ICD-10-CM | POA: Diagnosis not present

## 2016-07-15 MED FILL — MUPIROCIN 2% OINTMENT: 2 | 5 days supply | Qty: 22 | Fill #0

## 2016-08-18 DIAGNOSIS — D122 Benign neoplasm of ascending colon: Secondary | ICD-10-CM | POA: Diagnosis not present

## 2016-08-18 DIAGNOSIS — Z1211 Encounter for screening for malignant neoplasm of colon: Secondary | ICD-10-CM | POA: Diagnosis not present

## 2016-08-18 DIAGNOSIS — K635 Polyp of colon: Secondary | ICD-10-CM | POA: Diagnosis not present

## 2016-08-18 DIAGNOSIS — K6389 Other specified diseases of intestine: Secondary | ICD-10-CM | POA: Diagnosis not present

## 2016-08-20 ENCOUNTER — Telehealth: Payer: 59 | Admitting: Family

## 2016-08-20 DIAGNOSIS — J029 Acute pharyngitis, unspecified: Secondary | ICD-10-CM | POA: Diagnosis not present

## 2016-08-20 MED ORDER — PREDNISONE 5 MG PO TABS: 5.0000 mg | ORAL_TABLET | ORAL | 0 refills | Status: DC

## 2016-08-20 MED ORDER — BENZONATATE 100 MG PO CAPS: 100.0000 mg | ORAL_CAPSULE | Freq: Three times a day (TID) | ORAL | 0 refills | Status: DC | PRN

## 2016-08-20 MED FILL — BENZONATATE 100 MG CAP: 100 | 5 days supply | Qty: 30 | Fill #0

## 2016-08-20 MED FILL — predniSONE 5 MG TABS: 5 | 6 days supply | Qty: 21 | Fill #0

## 2016-08-20 NOTE — Progress Notes (Signed)
We are sorry that you are not feeling well.  Here is how we plan to help!  Based on what you have shared with me it looks like you have upper respiratory tract inflammation that has resulted in a significant cough.  Inflammation and infection in the upper respiratory tract is commonly called bronchitis and has four common causes:  Allergies, Viral Infections, Acid Reflux and Bacterial Infections.  Allergies, viruses and acid reflux are treated by controlling symptoms or eliminating the cause. An example might be a cough caused by taking certain blood pressure medications. You stop the cough by changing the medication. Another example might be a cough caused by acid reflux. Controlling the reflux helps control the cough.  Based on your presentation I believe you most likely have A cough due to a virus.  This is called viral bronchitis and is best treated by rest, plenty of fluids and control of the cough.  You may use Ibuprofen or Tylenol as directed to help your symptoms.     In addition you may use A non-prescription cough medication called Mucinex DM: take 2 tablets every 12 hours. and A prescription cough medication called Tessalon Perles 100mg . You may take 1-2 capsules every 8 hours as needed for your cough.  Sterapred 5 mg dosepak  USE OF BRONCHODILATOR ("RESCUE") INHALERS: There is a risk from using your bronchodilator too frequently.  The risk is that over-reliance on a medication which only relaxes the muscles surrounding the breathing tubes can reduce the effectiveness of medications prescribed to reduce swelling and congestion of the tubes themselves.  Although you feel brief relief from the bronchodilator inhaler, your asthma may actually be worsening with the tubes becoming more swollen and filled with mucus.  This can delay other crucial treatments, such as oral steroid medications. If you need to use a bronchodilator inhaler daily, several times per day, you should discuss this with your  provider.  There are probably better treatments that could be used to keep your asthma under control.     HOME CARE . Only take medications as instructed by your medical team. . Complete the entire course of an antibiotic. . Drink plenty of fluids and get plenty of rest. . Avoid close contacts especially the very young and the elderly . Cover your mouth if you cough or cough into your sleeve. . Always remember to wash your hands . A steam or ultrasonic humidifier can help congestion.   GET HELP RIGHT AWAY IF: . You develop worsening fever. . You become short of breath . You cough up blood. . Your symptoms persist after you have completed your treatment plan MAKE SURE YOU   Understand these instructions.  Will watch your condition.  Will get help right away if you are not doing well or get worse.  Your e-visit answers were reviewed by a board certified advanced clinical practitioner to complete your personal care plan.  Depending on the condition, your plan could have included both over the counter or prescription medications. If there is a problem please reply  once you have received a response from your provider. Your safety is important to [place].  If you have drug allergies check your prescription carefully.    You can use MyChart to ask questions about today's visit, request a non-urgent call back, or ask for a work or school excuse for 24 hours related to this e-Visit. If it has been greater than 24 hours you will need to follow up with your provider,  or enter a new e-Visit to address those concerns. You will get an e-mail in the next two days asking about your experience.  I hope that your e-visit has been valuable and will speed your recovery. Thank you for using e-visits.   

## 2016-09-06 MED FILL — ESTRADIOL 0.025 MG PATCH: 0.025 | 84 days supply | Qty: 24 | Fill #1

## 2016-09-21 ENCOUNTER — Ambulatory Visit (INDEPENDENT_AMBULATORY_CARE_PROVIDER_SITE_OTHER): Payer: 59 | Admitting: Podiatry

## 2016-09-21 ENCOUNTER — Encounter: Payer: Self-pay | Admitting: Podiatry

## 2016-09-21 ENCOUNTER — Ambulatory Visit (HOSPITAL_BASED_OUTPATIENT_CLINIC_OR_DEPARTMENT_OTHER)
Admission: RE | Admit: 2016-09-21 | Discharge: 2016-09-21 | Disposition: A | Discharge status: Home / Self Care | Payer: 59 | Source: Ambulatory Visit | Attending: Podiatry | Admitting: Podiatry

## 2016-09-21 DIAGNOSIS — M7732 Calcaneal spur, left foot: Secondary | ICD-10-CM | POA: Diagnosis not present

## 2016-09-21 DIAGNOSIS — M6740 Ganglion, unspecified site: Secondary | ICD-10-CM | POA: Insufficient documentation

## 2016-09-21 DIAGNOSIS — M898X9 Other specified disorders of bone, unspecified site: Secondary | ICD-10-CM

## 2016-09-21 NOTE — Patient Instructions (Signed)

## 2016-09-21 NOTE — Progress Notes (Signed)
   Subjective:    Patient ID: Kendra Le, female    DOB: [birth year], 61 y.o.   MRN: [id number]  HPI   61 year old female presents the office with concerns of the cyst the top of the left foot which been ongoing for about 1 year. She states that the area has been draining to times but does come back. She presents a for surgical consultation of removal of the cyst. She states the areas painful pressure in shoes that she denies any overlying redness or skin discoloration. She feels that is present every she gets some occasional numbness and tingling .She's had no other complaints today.  Review of Systems  HENT:       Ringing in ears  Neurological: Positive for headaches.  Hematological: Bruises/bleeds easily.  All other systems reviewed and are negative.      Objective:   Physical Exam General: AAO x3, NAD  Dermatological: Skin is warm, dry and supple bilateral. Nails x 10 are well manicured; remaining integument appears unremarkable at this time. There are no open sores, no preulcerative lesions, no rash or signs of infection present.  Vascular: Dorsalis Pedis artery and Posterior Tibial artery pedal pulses are 2/4 bilateral with immedate capillary fill time. There is no pain with calf compression, swelling, warmth, erythema.   Neruologic: Grossly intact via light touch bilateral. Vibratory intact via tuning fork bilateral. Protective threshold with Semmes Wienstein monofilament intact to all pedal sites bilateral.   Musculoskeletal: Fluid-filled mobile soft tissue masses present on the dorsal aspect of the left midfoot inferior to the ankle joint.  Mild tenderness palpation to this area. No overlying skin discoloration. There is no swelling otherwise. There is no crepitation. No signs of infection. Muscular strength 5/5 in all groups tested bilateral.  Gait: Unassisted, Nonantalgic.      Assessment & Plan:  61 year old female left foot soft tissue mass, likely ganglion  cyst -Treatment options discussed including all alternatives, risks, and complications -Etiology of symptoms were discussed -X-rays were obtained and reviewed with the patient.  -At this time a discussed the neurosurgical as well as conservative treatment. At this time she wishes to proceed with surgical intervention. Discussed her soft tissue mass excision and if needed exostectomy underneath the mass if present. I discussed the surgery as well as the postoperative course and she wishes to proceed. -The incision placement as well as the postoperative course was discussed with the patient. I discussed risks of the surgery which include, but not limited to, infection, bleeding, pain, swelling, need for further surgery, delayed or nonhealing, painful or ugly scar, numbness or sensation changes, over/under correction, recurrence, transfer lesions, further deformity, hardware failure, DVT/PE, loss of toe/foot. Patient understands these risks and wishes to proceed with surgery. The surgical consent was reviewed with the patient all 3 pages were signed. No promises or guarantees were given to the outcome of the procedure. All questions were answered to the best of my ability. Before the surgery the patient was encouraged to call the office if there is any further questions. The surgery will be performed at the [facility] on an outpatient basis. -Short CAM boot dispensed for postop.   Celesta Gentile, DPM

## 2016-09-30 ENCOUNTER — Ambulatory Visit: Payer: 59 | Admitting: Podiatry

## 2016-10-08 MED FILL — PANTOPRAZOLE SOD DR 40 MG T: 40 | 90 days supply | Qty: 90 | Fill #2

## 2016-11-03 DIAGNOSIS — M25775 Osteophyte, left foot: Secondary | ICD-10-CM | POA: Diagnosis not present

## 2016-11-03 DIAGNOSIS — M67472 Ganglion, left ankle and foot: Secondary | ICD-10-CM | POA: Diagnosis not present

## 2016-11-03 DIAGNOSIS — R0683 Snoring: Secondary | ICD-10-CM | POA: Diagnosis not present

## 2016-11-03 MED FILL — OXYCODONE/APAP 5/325 MG TAB: 5-325 | 3 days supply | Qty: 25 | Fill #0

## 2016-11-03 MED FILL — PROMETHAZINE 25 MG TABLET: 25 | 7 days supply | Qty: 20 | Fill #0

## 2016-11-03 MED FILL — CLINDAMYCIN HCL 150 MG CAPS: 150 | 7 days supply | Qty: 21 | Fill #0

## 2016-11-09 ENCOUNTER — Encounter: Payer: Self-pay | Admitting: Podiatry

## 2016-11-09 ENCOUNTER — Ambulatory Visit (HOSPITAL_BASED_OUTPATIENT_CLINIC_OR_DEPARTMENT_OTHER)
Admission: RE | Admit: 2016-11-09 | Discharge: 2016-11-09 | Disposition: A | Discharge status: Home / Self Care | Payer: 59 | Source: Ambulatory Visit | Attending: Podiatry | Admitting: Podiatry

## 2016-11-09 ENCOUNTER — Ambulatory Visit (INDEPENDENT_AMBULATORY_CARE_PROVIDER_SITE_OTHER): Payer: Self-pay | Admitting: Podiatry

## 2016-11-09 DIAGNOSIS — M898X9 Other specified disorders of bone, unspecified site: Secondary | ICD-10-CM

## 2016-11-09 DIAGNOSIS — Z9889 Other specified postprocedural states: Secondary | ICD-10-CM | POA: Diagnosis present

## 2016-11-09 DIAGNOSIS — M2570 Osteophyte, unspecified joint: Secondary | ICD-10-CM | POA: Diagnosis not present

## 2016-11-09 DIAGNOSIS — M6740 Ganglion, unspecified site: Secondary | ICD-10-CM

## 2016-11-10 NOTE — Progress Notes (Signed)
Subjective: Kendra Le is a 61 y.o. is seen today in office s/p left foot soft tissue mass excision and exostectomy preformed on 11/03/2016. They state their pain is controlled. She did have some pain last night but otherwise she is doing well. She has remained in the surgical boot. She did get sick from the antibiotics and took only 4 days. After stopping the medication the symptoms resolved. Denies any systemic complaints such as fevers, chills, nausea, vomiting. No calf pain, chest pain, shortness of breath.   Objective: General: No acute distress, AAOx3  DP/PT pulses palpable 2/4, CRT < 3 sec to all digits.  Protective sensation intact. Motor function intact.  Left foot: Incision is well coapted without any evidence of dehiscence and sutures are intact. There is no surrounding erythema, ascending cellulitis, fluctuance, crepitus, malodor, drainage/purulence. There is mild edema around the surgical site. There is mild pain along the surgical site.  No other areas of tenderness to bilateral lower extremities.  No other open lesions or pre-ulcerative lesions.  No pain with calf compression, swelling, warmth, erythema.   Assessment and Plan:  Status post left foot surgery, doing well with no complications   -Treatment options discussed including all alternatives, risks, and complications -X-rays ordered -Antibiotic ointment was applied followed by dry sterile dressing. Keep the dressing clea, intact. -Ice/elevation -Continue CAM boot.  -Pain medication as needed. -Monitor for any clinical signs or symptoms of infection and DVT/PE and directed to call the office immediately should any occur or go to the ER. -Follow-up as scheduled or sooner if any problems arise. In the meantime, encouraged to call the office with any questions, concerns, change in symptoms.   Celesta Gentile, DPM

## 2016-11-16 ENCOUNTER — Ambulatory Visit (INDEPENDENT_AMBULATORY_CARE_PROVIDER_SITE_OTHER): Payer: Self-pay | Admitting: Podiatry

## 2016-11-16 ENCOUNTER — Encounter: Payer: Self-pay | Admitting: Podiatry

## 2016-11-16 DIAGNOSIS — H903 Sensorineural hearing loss, bilateral: Secondary | ICD-10-CM | POA: Diagnosis not present

## 2016-11-16 DIAGNOSIS — M898X9 Other specified disorders of bone, unspecified site: Secondary | ICD-10-CM

## 2016-11-16 DIAGNOSIS — M6740 Ganglion, unspecified site: Secondary | ICD-10-CM

## 2016-11-16 DIAGNOSIS — H9313 Tinnitus, bilateral: Secondary | ICD-10-CM | POA: Diagnosis not present

## 2016-11-16 DIAGNOSIS — H9312 Tinnitus, left ear: Secondary | ICD-10-CM | POA: Insufficient documentation

## 2016-11-16 DIAGNOSIS — M26621 Arthralgia of right temporomandibular joint: Secondary | ICD-10-CM | POA: Insufficient documentation

## 2016-11-16 NOTE — Progress Notes (Signed)
Subjective: Kendra Le is a 61 y.o. is seen today in office s/p left foot soft tissue mass excision and exostectomy preformed on 11/03/2016. She presents today for suture removal. She states that she is doing well. She is not having any pain. She has been working. She has continued with the CAM boot. Denies any systemic complaints such as fevers, chills, nausea, vomiting. No calf pain, chest pain, shortness of breath.   Objective: General: No acute distress, AAOx3  DP/PT pulses palpable 2/4, CRT < 3 sec to all digits.  Protective sensation intact. Motor function intact.  Left foot: Incision is well coapted without any evidence of dehiscence and sutures are intact. There is no surrounding erythema, ascending cellulitis, fluctuance, crepitus, malodor, drainage/purulence. There is trace edema around the surgical site. There is no pain along the surgical site.  No other areas of tenderness to bilateral lower extremities.  No other open lesions or pre-ulcerative lesions.  No pain with calf compression, swelling, warmth, erythema.   Assessment and Plan:  Status post left foot surgery, doing well with no complications   -Treatment options discussed including all alternatives, risks, and complications -Sutures removed without complications. Steri-Strips are applied followed by antibiotic ointment and a bandage. She can start to shower tomorrow as long as the incision is doing well.  -She can start to transition to a regular shoe as tolerated over the next week or so.  -Continue ice/elevation.  -Pain medication as needed-she is not taking any pain medication at this time.  -Monitor for any clinical signs or symptoms of infection and DVT/PE and directed to call the office immediately should any occur or go to the ER. -Follow-up in 3 weeks or sooner if any problems arise. In the meantime, encouraged to call the office with any questions, concerns, change in symptoms.   Celesta Gentile, DPM

## 2016-11-23 ENCOUNTER — Encounter: Payer: Self-pay | Admitting: Podiatry

## 2016-11-23 NOTE — Progress Notes (Signed)
Left foot removal of soft tissue mass, possible removal of bone spur from under the soft tissue mass

## 2016-11-26 ENCOUNTER — Encounter: Payer: Self-pay | Admitting: Podiatry

## 2016-11-26 ENCOUNTER — Telehealth: Payer: Self-pay | Admitting: *Deleted

## 2016-11-26 ENCOUNTER — Ambulatory Visit (INDEPENDENT_AMBULATORY_CARE_PROVIDER_SITE_OTHER): Payer: Self-pay | Admitting: Podiatry

## 2016-11-26 DIAGNOSIS — M898X9 Other specified disorders of bone, unspecified site: Secondary | ICD-10-CM

## 2016-11-26 DIAGNOSIS — M6740 Ganglion, unspecified site: Secondary | ICD-10-CM

## 2016-11-26 MED ORDER — SULFAMETHOXAZOLE-TRIMETHOPRIM 800-160 MG PO TABS: 1.0000 | ORAL_TABLET | Freq: Two times a day (BID) | ORAL | 1 refills | Status: DC

## 2016-11-26 MED FILL — SULFAMETHOXAZOLE/TMP DS TAB: 800-160 | 10 days supply | Qty: 20 | Fill #0

## 2016-11-26 MED FILL — ESTRADIOL 0.025 MG PATCH: 0.025 | 84 days supply | Qty: 24 | Fill #2

## 2016-11-26 NOTE — Telephone Encounter (Signed)
Pt states had surgery 3 weeks ago with Dr. Jacqualyn Posey and the incision site looks bad and there is drainage. I told pt I felt she should come in today, to be seen by either Dr. Jacqualyn Posey or the nurse and transferred pt to [facility].

## 2016-11-30 ENCOUNTER — Ambulatory Visit (INDEPENDENT_AMBULATORY_CARE_PROVIDER_SITE_OTHER): Payer: 59 | Admitting: Podiatry

## 2016-11-30 ENCOUNTER — Encounter: Payer: Self-pay | Admitting: Podiatry

## 2016-11-30 DIAGNOSIS — M898X9 Other specified disorders of bone, unspecified site: Secondary | ICD-10-CM

## 2016-11-30 DIAGNOSIS — M6740 Ganglion, unspecified site: Secondary | ICD-10-CM

## 2016-11-30 NOTE — Progress Notes (Signed)
Subjective: Kendra Le is a 61 y.o. is seen today in office s/p left foot soft tissue mass excision and exostectomy preformed on 11/03/2016. She presents today for concerns of possible infection to the left foot. She states this started about 2 days ago she noticed some mild redness around the incision as well as a small amount of clear drainage. She denies any red streaks. 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 She does keep it covered with a bandage daily. Denies any pus. No increased swelling. Denies any systemic complaints such as fevers, chills, nausea, vomiting. No calf pain, chest pain, shortness of breath.   Objective: General: No acute distress, AAOx3  DP/PT pulses palpable 2/4, CRT < 3 sec to all digits.  Protective sensation intact. Motor function intact.  Left foot: Incision is well coapted without any evidence of dehiscence and a scar is formed. There is very faint amount of serous drainage expressed from the incision how to be superficial. There is no significant skin breakdown. There is faint amount of erythema directly around the incision but does not extend past 1 cm. This appears to be blotchy in appearance. There is no increase in warmth and there is no ascending synovitis. There is no fluctuance, crepitus, malodor. Trace edema along the incision site. No other open lesions or pre-ulcerative lesions.  No pain with calf compression, swelling, warmth, erythema.   Assessment and Plan:  Status post left foot surgery, presents for possible infection.  -Treatment options discussed including all alternatives, risks, and complications -Appearance is more blotchy and has almost a look at irritation possible sensitivity to the bacitracin ointment she has been applying. However we'll start Keflex in case of infection. Betadine was applied over the incision and she can continue this for daily dressing changes.   -Continue elevation and ice to the area.  -She is not taking pain medication.  -Monitor for any clinical signs or symptoms of infection and DVT/PE and directed to call the office immediately should any occur or go to the ER. -Follow-up as scheduled or sooner if any problems arise. In the meantime, encouraged to call the office with any questions, concerns, change in symptoms.   Celesta Gentile, DPM

## 2016-11-30 NOTE — Progress Notes (Signed)
Subjective: Kendra Le is a 61 y.o. is seen today in office s/p left foot soft tissue mass excision and exostectomy preformed on 11/03/2016. She presents today for follow-up. She states the redness is almost completely resolved that she still gets a very minimal amount of clear drainage coming from the area. She states that she is doing better however. She denies any pus and denies any red streaks. She still gets mild swan to the surgical site. She's been wearing a regular shoe and she continues to work on her feet all day. She has no new concerns today. Denies any systemic complaints such as fevers, chills, nausea, vomiting. No calf pain, chest pain, shortness of breath.   Objective: General: No acute distress, AAOx3  DP/PT pulses palpable 2/4, CRT < 3 sec to all digits.  Protective sensation intact. Motor function intact.  Left foot: Incision is well coapted without any evidence of dehiscence and a scar has formed except the central aspect there is a very small superficial area of skin breakdown however this appears to be superficial not going deep. A small amount of clear drainage expressed but there is no pus. There is no surrounding erythema or ascending synovitis. The area of erythema appears to be resolved. There is no ascending saline disc. There is no malodor. There is no fluctuance or crepitus. No other open lesions or pre-ulcerative lesions.  No pain with calf compression, swelling, warmth, erythema.   Assessment and Plan:  Status post left foot surgery, presents for follow up with resolved erythema   -Treatment options discussed including all alternatives, risks, and complications -Continue with Betadine dressing changes to the area daily as well as a bandage. Continue with his supportive shoe gear. As the area completely healed she can graduate start increase activity level. -Finish course of antibiotics. -Elevation and ice the area. -Compression stocking to help with swelling.   -Monitor for any clinical signs or symptoms of infection and directed to call the office immediately should any occur or go to the ER. -Follow-up as scheduled or sooner if any problems arise. In the meantime, encouraged to call the office with any questions, concerns, change in symptoms.   Celesta Gentile, DPM

## 2016-12-28 ENCOUNTER — Encounter: Payer: Self-pay | Admitting: Podiatry

## 2016-12-28 ENCOUNTER — Ambulatory Visit (INDEPENDENT_AMBULATORY_CARE_PROVIDER_SITE_OTHER): Payer: Self-pay | Admitting: Podiatry

## 2016-12-28 DIAGNOSIS — M898X9 Other specified disorders of bone, unspecified site: Secondary | ICD-10-CM

## 2016-12-28 DIAGNOSIS — M6740 Ganglion, unspecified site: Secondary | ICD-10-CM

## 2016-12-28 NOTE — Progress Notes (Signed)
Subjective: Kendra Le is a 61 y.o. is seen today in office s/p left foot soft tissue mass excision and exostectomy preformed on 11/03/2016. She states that she is doing well and denies any pain. She states she gets some swelling to the top of the foot at the end of the day. She has some numbness to the big toe which has been ongoing since surgery but denies any shooting pain and has been about the same. She is wearing a regular shoe. She is working normal hours. She has no other concerns today. Denies any systemic complaints such as fevers, chills, nausea, vomiting. No calf pain, chest pain, shortness of breath.   Objective: General: No acute distress, AAOx3  DP/PT pulses palpable 2/4, CRT < 3 sec to all digits.  Protective sensation intact. Motor function intact.  Left foot: Incision is well coapted without any evidence of dehiscence and a scar has formed. There is no surrounding erythema, ascending cellulitis. Faint edema. No clinical signs of infection. No tenderness to palpation over the surgical site. There is some subjective numbness to the medial hallux.  No other areas of tenderness identified bilaterally.  No other open lesions or pre-ulcerative lesions.  No pain with calf compression, swelling, warmth, erythema.   Assessment and Plan:  Status post left foot surgery, improved   -Treatment options discussed including all alternatives, risks, and complications -at this time she is doing well. Recommended cocoa butter/vitamin E cream on the scar daily to help with any scar adhesions, although minimal today. Recommended to continue with compression sock. -Discussed the numbness to the toe should hopefully come back but will take some time. Will continue to monitor.  -Elevation and ice -Compression stocking to help with swelling- continue.  -Monitor for any clinical signs or symptoms of infection and directed to call the office immediately should any occur or go to the ER. -Follow-up in  2 months or sooner if any problems arise. In the meantime, encouraged to call the office with any questions, concerns, change in symptoms.   Celesta Gentile, DPM

## 2016-12-30 MED FILL — PANTOPRAZOLE SOD DR 40 MG T: 40 | 90 days supply | Qty: 90 | Fill #3

## 2017-02-11 MED FILL — ESTRADIOL 0.025 MG PATCH: 0.025 | 84 days supply | Qty: 24 | Fill #3

## 2017-03-01 ENCOUNTER — Ambulatory Visit (INDEPENDENT_AMBULATORY_CARE_PROVIDER_SITE_OTHER): Payer: Self-pay | Admitting: Podiatry

## 2017-03-01 ENCOUNTER — Encounter: Payer: Self-pay | Admitting: Podiatry

## 2017-03-01 DIAGNOSIS — M898X9 Other specified disorders of bone, unspecified site: Secondary | ICD-10-CM

## 2017-03-01 DIAGNOSIS — M792 Neuralgia and neuritis, unspecified: Secondary | ICD-10-CM

## 2017-03-01 NOTE — Progress Notes (Signed)
Subjective: Kendra Le is a 61 y.o. is seen today in office s/p left foot soft tissue mass excision and exostectomy preformed on 11/03/2016. She states that she has been able to exercise, work without any problems. She states that she occasionally gets a sharp pain which lasted just a few seconds this is mostly when she brushes her teeth. She states that she puts her foot into a stress position sometimes she'll get a sharp pain but this is very intermittent. She denies any recent injury or trauma. She has no other concerns today. Denies any systemic complaints such as fevers, chills, nausea, vomiting. No calf pain, chest pain, shortness of breath.   Objective: General: No acute distress, AAOx3  DP/PT pulses palpable 2/4, CRT < 3 sec to all digits.  Protective sensation intact. Motor function intact.  Left foot: Incision is well coapted without any evidence of dehiscence and a scar has formed. Scar tissue mildly thickened. There is no surrounding erythema, ascending cellulitis. There is very faint edema along the excision. There is no clinical signs of infection. Is no tenderness palpation of this area. Negative Tinel sign. No pain or sharp pains today. No other areas of tenderness identified bilaterally.  No other open lesions or pre-ulcerative lesions.  No pain with calf compression, swelling, warmth, erythema.   Assessment and Plan:  Status post left foot surgery, improved   -Treatment options discussed including all alternatives, risks, and complications -At this time discussed with her steroid injection but wishes to hold off. I ordered a scar compound cream through [facility] and at his anti-inflammatory to this to apply daily. -Discussed the shoe changes as well as padding to help take pressure off this incision. -Follow-up of symptoms are not resolved next 6-8 weeks or sooner if needed. Call any questions or concerns.  Celesta Gentile, DPM

## 2017-04-27 MED FILL — PANTOPRAZOLE SOD DR 40 MG T: 40 | 90 days supply | Qty: 90 | Fill #0

## 2017-05-17 DIAGNOSIS — K219 Gastro-esophageal reflux disease without esophagitis: Secondary | ICD-10-CM | POA: Diagnosis not present

## 2017-05-17 DIAGNOSIS — E669 Obesity, unspecified: Secondary | ICD-10-CM | POA: Diagnosis not present

## 2017-06-03 MED FILL — ESTRADIOL 0.025 MG PATCH: 0.025 | 84 days supply | Qty: 24 | Fill #0

## 2017-06-27 DIAGNOSIS — Z1231 Encounter for screening mammogram for malignant neoplasm of breast: Secondary | ICD-10-CM | POA: Diagnosis not present

## 2017-06-27 DIAGNOSIS — Z01419 Encounter for gynecological examination (general) (routine) without abnormal findings: Secondary | ICD-10-CM | POA: Diagnosis not present

## 2017-06-27 DIAGNOSIS — Z6832 Body mass index (BMI) 32.0-32.9, adult: Secondary | ICD-10-CM | POA: Diagnosis not present

## 2017-06-28 ENCOUNTER — Other Ambulatory Visit: Payer: Self-pay | Admitting: Physician Assistant

## 2017-06-28 DIAGNOSIS — L570 Actinic keratosis: Secondary | ICD-10-CM | POA: Diagnosis not present

## 2017-06-28 DIAGNOSIS — C44519 Basal cell carcinoma of skin of other part of trunk: Secondary | ICD-10-CM | POA: Diagnosis not present

## 2017-06-28 MED FILL — FLUOROURACIL 5% CREAM: 5 | 28 days supply | Qty: 40 | Fill #0

## 2017-07-07 ENCOUNTER — Encounter: Payer: Self-pay | Admitting: Family Medicine

## 2017-07-07 ENCOUNTER — Ambulatory Visit: Payer: 59 | Admitting: Family Medicine

## 2017-07-07 DIAGNOSIS — M25562 Pain in left knee: Secondary | ICD-10-CM | POA: Diagnosis not present

## 2017-07-07 DIAGNOSIS — G8929 Other chronic pain: Secondary | ICD-10-CM | POA: Diagnosis not present

## 2017-07-07 MED ORDER — METHYLPREDNISOLONE ACETATE 40 MG/ML IJ SUSP
40.0000 mg | Freq: Once | INTRAMUSCULAR | Status: AC
  Administered 2017-07-07: 40 mg via INTRA_ARTICULAR

## 2017-07-07 NOTE — Progress Notes (Signed)
PCP: Shirline Frees, MD  Subjective:   HPI: Patient is a 62 y.o. female here for left knee pain.  Patient denies new injury or trauma. She states over past couple weeks she's had worsening anterior left knee pain radiating to back of knee. Associated swelling especially by end of work day. Has tried icing, aleve. Also bothers her with driving. Pain level 4/10 and sharp. History of ACL reconstruction in 1996 Has done well since last visit about 5 years ago for this knee. No skin changes, numbness.  Past Medical History:  Diagnosis Date  . GERD (gastroesophageal reflux disease)   . Hiatal hernia   . Migraine     Current Outpatient Medications on File Prior to Visit  Medication Sig Dispense Refill  . aspirin 81 MG tablet Take 81 mg by mouth daily.     . calcium-vitamin D (OSCAL WITH D) 500-200 MG-UNIT per tablet Take 1 tablet by mouth at bedtime.      Marland Kitchen esomeprazole (NEXIUM) 40 MG capsule Take 40 mg by mouth daily before breakfast.    . estradiol (VIVELLE-DOT) 0.025 MG/24HR   12  . fluorouracil (EFUDEX) 5 % cream APPLY TOPICALLY TO THE CHEST DAILY AT BEDTIME FOR 2 TO 4 WEEKS  1  . Multiple Vitamins-Minerals (MULTIVITAMINS THER. W/MINERALS) TABS Take 1 tablet by mouth 3 (three) times daily.      Salley Scarlet [facility]  Scar Cream -  Verapamil 10%, Pentoxifylline 5% / Diclofenac 3% Apply 1-2 grams to affected area 3-4 times daily Qty. 120 gm 3 refills    . pantoprazole (PROTONIX) 40 MG tablet Take 40 mg by mouth daily.     No current facility-administered medications on file prior to visit.     Past Surgical History:  Procedure Laterality Date  . ABDOMINAL HYSTERECTOMY    . ANTERIOR CRUCIATE LIGAMENT REPAIR     left  . BREAST SURGERY    . CHOLECYSTECTOMY      Allergies  Allergen Reactions  . Erythromycin   . Latex Other (See Comments)    Other  . Penicillins Nausea And Vomiting    Social History   Socioeconomic History  . Marital status: Married     Spouse name: Not on file  . Number of children: Not on file  . Years of education: Not on file  . Highest education level: Not on file  Social Needs  . Financial resource strain: Not on file  . Food insecurity - worry: Not on file  . Food insecurity - inability: Not on file  . Transportation needs - medical: Not on file  . Transportation needs - non-medical: Not on file  Occupational History  . Not on file  Tobacco Use  . Smoking status: Never Smoker  . Smokeless tobacco: Never Used  Substance and Sexual Activity  . Alcohol use: No    Alcohol/week: 0.0 oz  . Drug use: No  . Sexual activity: Not on file  Other Topics Concern  . Not on file  Social History Narrative  . Not on file    Family History  Problem Relation Age of Onset  . Heart attack Mother   . Hyperlipidemia Mother   . Diabetes Mother   . Heart attack Father   . Hyperlipidemia Father   . Hypertension Father   . Sudden death Neg Hx     BP 116/75   Pulse 96   Ht 5\' 2"  (1.575 m)   Wt 180 lb (81.6 kg)   BMI  32.92 kg/m   Review of Systems: See HPI above.     Objective:  Physical Exam:  Gen: NAD, comfortable in exam room  Left knee: No gross deformity, ecchymoses.  Minimal effusion. TTP post patellar facets, medial joint line mildly.  No other tenderness. FROM with 5/5 strength. Negative ant/post drawers. Negative valgus/varus testing. Negative lachmanns. Negative mcmurrays, apleys, patellar apprehension. NV intact distally.  Right knee: No deformity. FROM with 5/5 strength. No tenderness to palpation. NVI distally.   Assessment & Plan:  1. Left knee pain - 2/2 DJD.  Injection given today.  Discussed tylenol, topical medications, supplements, aleve.  Home exercises.  Heat/ice.  F/u in 1 month.  After informed written consent timeout was performed, patient was seated on exam table. Left knee was prepped with alcohol swab and utilizing anteromedial approach, patient's left knee was injected  intraarticularly with 3:1 bupivicaine: depomedrol. Patient tolerated the procedure well without immediate complications.

## 2017-07-07 NOTE — Patient Instructions (Signed)
Your pain is due to arthritis. These are the different medications you can take for this: Tylenol 500mg 1-2 tabs three times a day for pain. Capsaicin, aspercreme, or biofreeze topically up to four times a day may also help with pain. Some supplements that may help for arthritis: Boswellia extract, curcumin, pycnogenol Aleve 1-2 tabs twice a day with food Cortisone injections are an option - you were given this today. If cortisone injections do not help, there are different types of shots that may help but they take longer to take effect. It's important that you continue to stay active. Straight leg raises, knee extensions 3 sets of 10 once a day (add ankle weight if these become too easy). Consider physical therapy to strengthen muscles around the joint that hurts to take pressure off of the joint itself. Shoe inserts with good arch support may be helpful. Heat or ice 15 minutes at a time 3-4 times a day as needed to help with pain. Water aerobics and cycling with low resistance are the best two types of exercise for arthritis though any exercise is ok as long as it doesn't worsen the pain. Follow up with me in 1 month or as needed.  

## 2017-07-07 NOTE — Assessment & Plan Note (Signed)
2/2 DJD.  Injection given today.  Discussed tylenol, topical medications, supplements, aleve.  Home exercises.  Heat/ice.  F/u in 1 month.  After informed written consent timeout was performed, patient was seated on exam table. Left knee was prepped with alcohol swab and utilizing anteromedial approach, patient's left knee was injected intraarticularly with 3:1 bupivicaine: depomedrol. Patient tolerated the procedure well without immediate complications.

## 2017-07-18 MED FILL — PANTOPRAZOLE SOD DR 40 MG T: 40 | 90 days supply | Qty: 90 | Fill #0

## 2017-08-15 MED FILL — ESTRADIOL 0.025 MG PATCH: 0.025 | 84 days supply | Qty: 24 | Fill #0

## 2017-09-01 ENCOUNTER — Encounter: Payer: Self-pay | Admitting: Family Medicine

## 2017-09-01 ENCOUNTER — Ambulatory Visit: Payer: 59 | Admitting: Family Medicine

## 2017-09-01 DIAGNOSIS — M7062 Trochanteric bursitis, left hip: Secondary | ICD-10-CM

## 2017-09-01 DIAGNOSIS — M25511 Pain in right shoulder: Secondary | ICD-10-CM | POA: Diagnosis not present

## 2017-09-01 DIAGNOSIS — M25552 Pain in left hip: Secondary | ICD-10-CM | POA: Diagnosis not present

## 2017-09-01 MED ORDER — METHYLPREDNISOLONE ACETATE 40 MG/ML IJ SUSP
40.0000 mg | Freq: Once | INTRAMUSCULAR | Status: AC
  Administered 2017-09-01: 40 mg via INTRA_ARTICULAR

## 2017-09-01 NOTE — Patient Instructions (Signed)
You have IT band syndrome Avoid painful activities as much as possible. Ice over area of pain 3-4 times a day for 15 minutes at a time Consider meloxicam - let me know if you want to try this. You were given a cortisone injection today. Standing hip rotations and hip side raise exercises 3 sets of 10 once a day - add weights if this becomes too easy. Stretches - pick 2-3 and hold for 20-30 seconds x 3 - do once or twice a day. If not improving, can consider physical therapy. Follow up with me in 6 weeks.  You have rotator cuff impingement of your left shoulder Try to avoid painful activities (overhead activities, lifting with extended arm) as much as possible. Let me know if you want to try meloxicam. Can take tylenol in addition to this. Subacromial injection may be beneficial to help with pain and to decrease inflammation - you were given this today. Consider physical therapy with transition to home exercise program. Do home exercise program with theraband and scapular stabilization exercises daily 3 sets of 10 once a day. If not improving at follow-up we will consider further imaging, physical therapy, and/or nitro patches. Follow up with me in 6 weeks.

## 2017-09-04 ENCOUNTER — Encounter: Payer: Self-pay | Admitting: Family Medicine

## 2017-09-04 DIAGNOSIS — M25511 Pain in right shoulder: Secondary | ICD-10-CM | POA: Insufficient documentation

## 2017-09-04 DIAGNOSIS — M25552 Pain in left hip: Secondary | ICD-10-CM | POA: Insufficient documentation

## 2017-09-04 NOTE — Assessment & Plan Note (Signed)
2/2 trochanteric bursitis, IT band syndrome.  Icing, shown home exercises and stretches to do daily.  Injection given.  Consider physical therapy.  F/u in 6 weeks.  After informed written consent timeout was performed, patient was lying on right side on exam table.  Area overlying left trochanteric bursa prepped with alcohol swab then patient's left greater trochanteric bursa was injected with 6:2 bupivicaine: depomedrol.  Patient tolerated the procedure well without immediate complications.

## 2017-09-04 NOTE — Assessment & Plan Note (Signed)
2/2 rotator cuff impingement.  Tylenol as needed.  Consider meloxicam.  Subacromial injection given today.  Shown home exercise program to do daily.  F/u in 6 weeks.  After informed written consent timeout was performed, patient was seated on exam table. Right shoulder was prepped with alcohol swab and utilizing posterior approach, patient's right subacromial space was injected with 3:1 bupivicaine: depomedrol. Patient tolerated the procedure well without immediate complications.

## 2017-09-04 NOTE — Progress Notes (Signed)
PCP: Shirline Frees, MD  Subjective:   HPI: Patient is a 62 y.o. female here for left hip, right arm pain.  Patient reports she recently started to get pain lateral left hip again. Pain 3/10 but sharp if she lies on her left side. Taking aleve which helps some. Also over past 6 weeks has developed lateral right shoulder/upper arm pain. Noticed more with planks, pushups. Cannot sleep on right side. Taking aleve, using heat. No skin changes, numbness.  Past Medical History:  Diagnosis Date  . GERD (gastroesophageal reflux disease)   . Hiatal hernia   . Migraine     Current Outpatient Medications on File Prior to Visit  Medication Sig Dispense Refill  . aspirin 81 MG tablet Take 81 mg by mouth daily.     . calcium-vitamin D (OSCAL WITH D) 500-200 MG-UNIT per tablet Take 1 tablet by mouth at bedtime.      Marland Kitchen esomeprazole (NEXIUM) 40 MG capsule Take 40 mg by mouth daily before breakfast.    . estradiol (VIVELLE-DOT) 0.025 MG/24HR   12  . fluorouracil (EFUDEX) 5 % cream APPLY TOPICALLY TO THE CHEST DAILY AT BEDTIME FOR 2 TO 4 WEEKS  1  . Multiple Vitamins-Minerals (MULTIVITAMINS THER. W/MINERALS) TABS Take 1 tablet by mouth 3 (three) times daily.      [facility]  Scar Cream -  Verapamil 10%, Pentoxifylline 5% / Diclofenac 3% Apply 1-2 grams to affected area 3-4 times daily Qty. 120 gm 3 refills    . pantoprazole (PROTONIX) 40 MG tablet Take 40 mg by mouth daily.     No current facility-administered medications on file prior to visit.     Past Surgical History:  Procedure Laterality Date  . ABDOMINAL HYSTERECTOMY    . ANTERIOR CRUCIATE LIGAMENT REPAIR     left  . BREAST SURGERY    . CHOLECYSTECTOMY      Allergies  Allergen Reactions  . Erythromycin   . Latex Other (See Comments)    Other  . Penicillins Nausea And Vomiting    Social History   Socioeconomic History  . Marital status: Married    Spouse name: Not on file  . Number of  children: Not on file  . Years of education: Not on file  . Highest education level: Not on file  Social Needs  . Financial resource strain: Not on file  . Food insecurity - worry: Not on file  . Food insecurity - inability: Not on file  . Transportation needs - medical: Not on file  . Transportation needs - non-medical: Not on file  Occupational History  . Not on file  Tobacco Use  . Smoking status: Never Smoker  . Smokeless tobacco: Never Used  Substance and Sexual Activity  . Alcohol use: No    Alcohol/week: 0.0 oz  . Drug use: No  . Sexual activity: Not on file  Other Topics Concern  . Not on file  Social History Narrative  . Not on file    Family History  Problem Relation Age of Onset  . Heart attack Mother   . Hyperlipidemia Mother   . Diabetes Mother   . Heart attack Father   . Hyperlipidemia Father   . Hypertension Father   . Sudden death Neg Hx     BP 105/71   Pulse 86   Ht 5\' 2"  (1.575 m)   Wt 180 lb (81.6 kg)   BMI 32.92 kg/m   Review of Systems: See HPI  above.     Objective:  Physical Exam:  Gen: NAD, comfortable in exam room  Left hip: No deformity, swelling, bruising. TTP greater trochanter.  No other tenderness. FROM with 4/5 strength hip abduction, 5/5 other motions. Negative logroll. Negative fabers and piriformis. NVI distally.  Right hip: No deformity. FROM with 5/5 strength. No tenderness to palpation. NVI distally. Negative logroll. Negative fabers and piriformis.  Right shoulder: No swelling, ecchymoses.  No gross deformity. No TTP. FROM with painful arc. Negative Hawkins, negative Neers. Negative Yergasons. Strength 5/5 with empty can and resisted internal/external rotation.  Pain empty can. Negative apprehension. NV intact distally.  Left shoulder: No swelling, ecchymoses.  No gross deformity. No TTP. FROM. Strength 5/5 with empty can and resisted internal/external rotation. NV intact distally.   Assessment &  Plan:  1. Left hip pain - 2/2 trochanteric bursitis, IT band syndrome.  Icing, shown home exercises and stretches to do daily.  Injection given.  Consider physical therapy.  F/u in 6 weeks.  After informed written consent timeout was performed, patient was lying on right side on exam table.  Area overlying left trochanteric bursa prepped with alcohol swab then patient's left greater trochanteric bursa was injected with 6:2 bupivicaine: depomedrol.  Patient tolerated the procedure well without immediate complications.  2. Right shoulder pain - 2/2 rotator cuff impingement.  Tylenol as needed.  Consider meloxicam.  Subacromial injection given today.  Shown home exercise program to do daily.  F/u in 6 weeks.  After informed written consent timeout was performed, patient was seated on exam table. Right shoulder was prepped with alcohol swab and utilizing posterior approach, patient's right subacromial space was injected with 3:1 bupivicaine: depomedrol. Patient tolerated the procedure well without immediate complications.

## 2017-10-26 DIAGNOSIS — H524 Presbyopia: Secondary | ICD-10-CM | POA: Diagnosis not present

## 2017-10-31 MED FILL — PANTOPRAZOLE SOD DR 40 MG T: 40 | 90 days supply | Qty: 90 | Fill #1

## 2017-11-18 MED FILL — ESTRADIOL 0.025 MG PATCH: 0.025 | 84 days supply | Qty: 24 | Fill #1

## 2017-12-17 IMAGING — DX DG FOOT 2V*L*
2 series · 2 of 2 positions shown · non-contrast
Comparison: 09/21/2016

CLINICAL DATA: Bony exostosis.  Status post dorsal exostectomy.

EXAM:
LEFT FOOT - 2 VIEW

[foot ap]
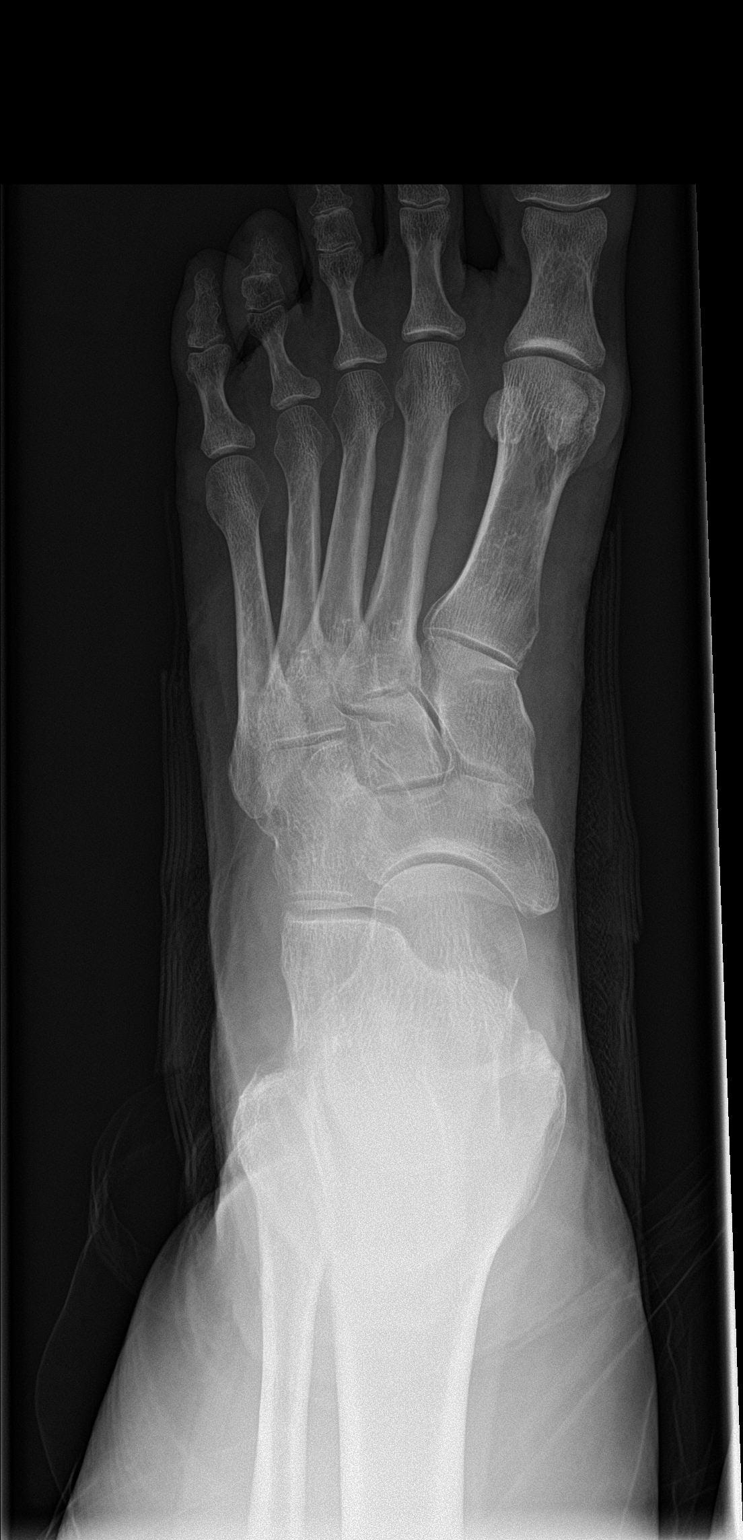

[foot lat]
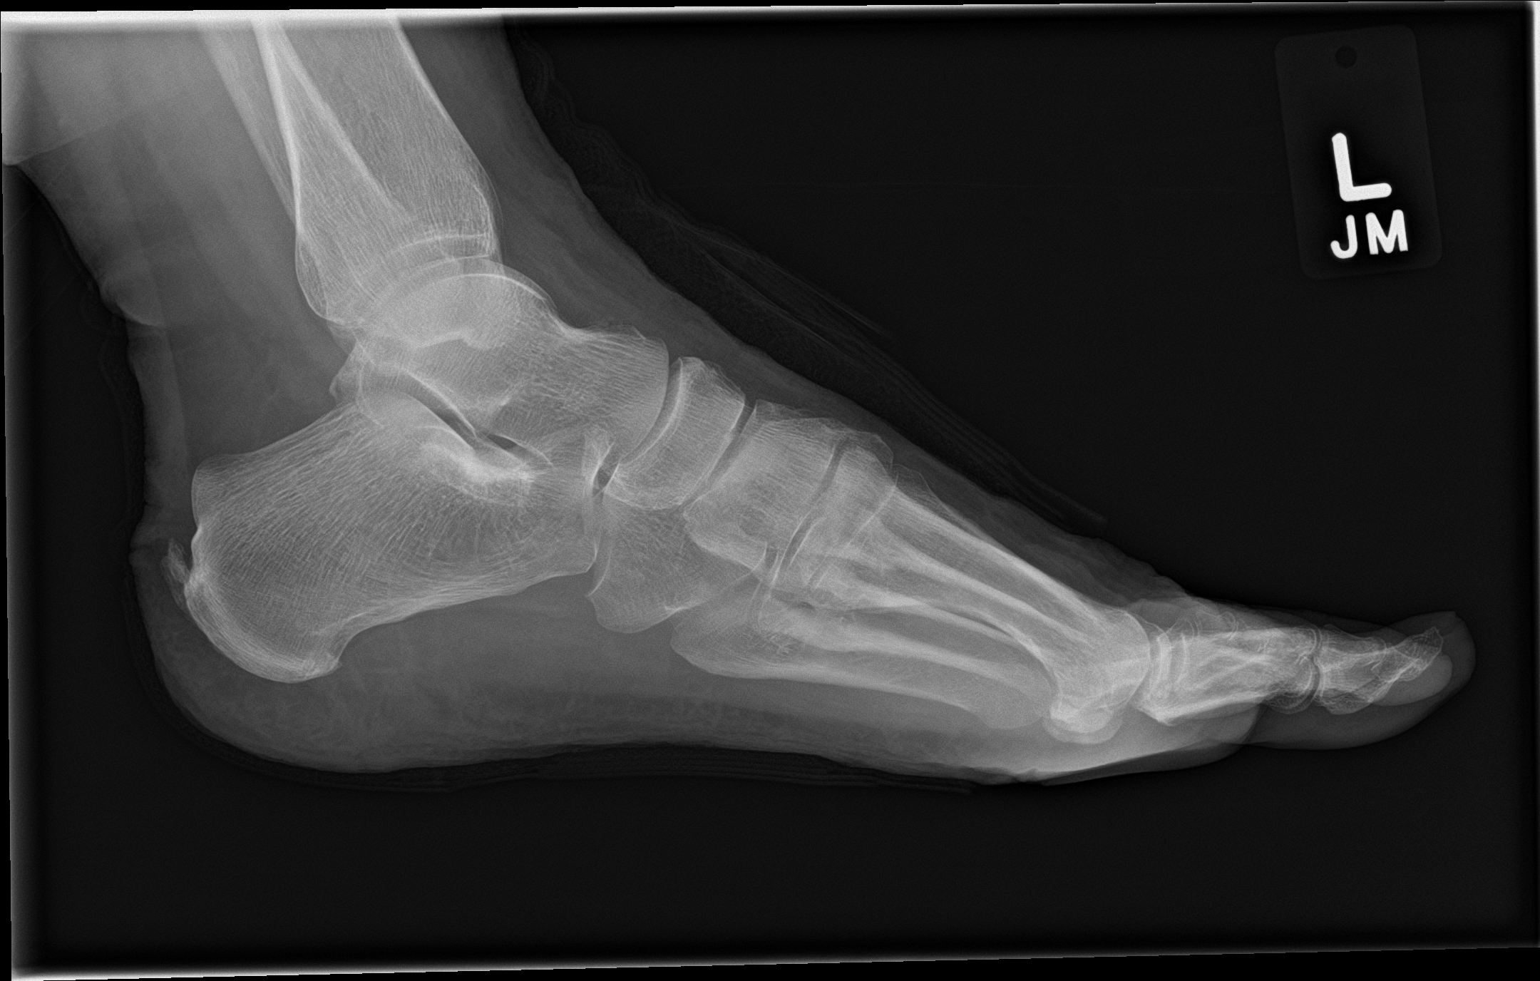

[2 of 2 positions shown; findings below may reference images not displayed]

FINDINGS: Calcification at the distal Achilles insertion on the posterior
aspect of the calcaneus. The other bones appear normal.
IMPRESSION: No acute abnormality.  No visible change since the prior study.

## 2018-01-26 NOTE — Addendum Note (Signed)
Addended by: Sherrie George F on: 01/26/2018 04:43 PM   Modules accepted: Orders

## 2018-02-07 ENCOUNTER — Ambulatory Visit: Payer: 59 | Attending: Family Medicine | Admitting: Physical Therapy

## 2018-02-07 ENCOUNTER — Other Ambulatory Visit: Payer: Self-pay

## 2018-02-07 ENCOUNTER — Encounter: Payer: Self-pay | Admitting: Physical Therapy

## 2018-02-07 DIAGNOSIS — M6281 Muscle weakness (generalized): Secondary | ICD-10-CM | POA: Diagnosis present

## 2018-02-07 DIAGNOSIS — M25511 Pain in right shoulder: Secondary | ICD-10-CM | POA: Diagnosis present

## 2018-02-07 DIAGNOSIS — G8929 Other chronic pain: Secondary | ICD-10-CM | POA: Diagnosis present

## 2018-02-07 DIAGNOSIS — R293 Abnormal posture: Secondary | ICD-10-CM | POA: Diagnosis present

## 2018-02-07 DIAGNOSIS — M25611 Stiffness of right shoulder, not elsewhere classified: Secondary | ICD-10-CM | POA: Diagnosis present

## 2018-02-07 MED FILL — PANTOPRAZOLE SOD DR 40 MG T: 40 | 90 days supply | Qty: 90 | Fill #2

## 2018-02-07 MED FILL — ESTRADIOL 0.025 MG PATCH: 0.025 | 84 days supply | Qty: 24 | Fill #2

## 2018-02-07 NOTE — Therapy (Signed)
[facility] [address]  [place], [place], [address] Phone: [phone]   Fax:  [phone]  Physical Therapy Evaluation  Patient Details  Name: Kendra Le MRN: [id number] Date of Birth: [birth year] Referring Provider: Karlton Lemon, MD   Encounter Date: 02/07/2018  PT End of Session - 02/07/18 0848    Visit Number  1    Number of Visits  12    Date for PT Re-Evaluation  03/24/18    Authorization Type  [facility]    PT Start Time  0804    PT Stop Time  0848    PT Time Calculation (min)  44 min    Activity Tolerance  Patient tolerated treatment well    Behavior During Therapy  [facility] for tasks assessed/performed       Past Medical History:  Diagnosis Date  . GERD (gastroesophageal reflux disease)   . Hiatal hernia   . Migraine     Past Surgical History:  Procedure Laterality Date  . ABDOMINAL HYSTERECTOMY    . ANTERIOR CRUCIATE LIGAMENT REPAIR     left  . BREAST SURGERY    . CHOLECYSTECTOMY      There were no vitals filed for this visit.   Subjective Assessment - 02/07/18 0809    Subjective  Pt reports her R RTC is "giving me a fit", starting ~Nov-Dec 2018. Received an injection ~3+ months ago and has been doing HEP, but pain has gotten worse.    Patient Stated Goals  "to be able to sleep on my R side at night & be able to comb my hair"    Currently in Pain?  Yes    Pain Score  5     Pain Location  Shoulder    Pain Orientation  Right;Lateral    Pain Descriptors / Indicators  Aching    Pain Type  Chronic pain    Pain Radiating Towards  down to mid upper lateral arm    Pain Onset  More than a month ago   ~Nov/Dec 2018   Pain Frequency  Constant    Aggravating Factors   fixing her hair    Pain Relieving Factors  Aleve    Effect of Pain on Daily Activities  difficulty grooming/reaching behind her head; unable to sleep on R side; difficulty reaching fwd overhead in prone         [facility] PT Assessment  - 02/07/18 0804      Assessment   Medical Diagnosis  R shoulder pain    Referring Provider  Karlton Lemon, MD    Onset Date/Surgical Date  --   ~Nov-Dec 2018   Hand Dominance  Right    Next MD Visit  none scheduled    Prior Therapy  PT for L ITB, back, and L knee      Balance Screen   Has the patient fallen in the past 6 months  No    Has the patient had a decrease in activity level because of a fear of falling?   No    Is the patient reluctant to leave their home because of a fear of falling?   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    Leisure  read, travel, walk daily, worked out 2 days/wk prior to retirement in Feb 2019      Observation/Other Assessments   Focus on Therapeutic Outcomes (FOTO)   Shoulder - 53% (47% limitation); predicted 65% (35% limitation)      Posture/Postural Control   Posture/Postural Control  Postural limitations    Postural Limitations  Rounded Shoulders;Forward head      ROM / Strength   AROM / PROM / Strength  AROM;Strength      AROM   AROM Assessment Site  Shoulder    Right/Left Shoulder  Right;Left    Right Shoulder Flexion  145 Degrees   painfull at end range   Right Shoulder ABduction  162 Degrees    Right Shoulder Internal Rotation  66 Degrees   FIR to L2   Right Shoulder External Rotation  40 Degrees   FER to C6   Left Shoulder Flexion  160 Degrees    Left Shoulder ABduction  165 Degrees    Left Shoulder Internal Rotation  88 Degrees   FIR to T10   Left Shoulder External Rotation  80 Degrees   FER to T2     Strength   Overall Strength Comments  pain with all resisted motions on R    Strength Assessment Site  Shoulder    Right/Left Shoulder  Right;Left    Right Shoulder Flexion  4-/5    Right Shoulder ABduction  4-/5    Right Shoulder Internal  Rotation  4-/5    Right Shoulder External Rotation  3+/5    Left Shoulder Flexion  4+/5    Left Shoulder ABduction  4+/5    Left Shoulder Internal Rotation  4+/5    Left Shoulder External Rotation  4/5      Special Tests    Special Tests  Rotator Cuff Impingement    Rotator Cuff Impingment tests  Empty Can test;Hawkins- Kennedy test      Hawkins-Kennedy test   Findings  Positive    Side  Right      Empty Can test   Findings  Positive    Side  Right                Objective measurements completed on examination: See above findings.      [facility] Adult PT Treatment/Exercise - 02/07/18 0804      Exercises   Exercises  Shoulder      Shoulder Exercises: Seated   Retraction  Both;10 reps    Retraction Limitations  5 sec hold      Shoulder Exercises: Standing   External Rotation  Right;5 reps;Theraband    Theraband Level (Shoulder External Rotation)  Level 1 (Yellow)    External Rotation Limitations  isometric walk-outs    Internal Rotation  Right;10 reps;Theraband    Theraband Level (Shoulder Internal Rotation)  Level 1 (Yellow)    Internal Rotation Limitations  towel under elbow for neutral shoulder alignment    Extension  Both;10 reps;Theraband;Strengthening    Theraband Level (Shoulder Extension)  Level 1 (Yellow)    Extension Limitations  cues for scap retraction    Row  Both;10 reps;Theraband;Strengthening    Theraband Level (Shoulder Row)  Level 1 (Yellow)    Row Limitations  emphasis on scap retraction             PT Education - 02/07/18 0845    Education Details  Pt eval findings, anticipated POC & initial HEP    Person(s) Educated  Patient  Methods  Explanation;Demonstration;Handout    Comprehension  Verbalized understanding;Returned demonstration;Need further instruction       PT Short Term Goals - 02/07/18 0848      PT SHORT TERM GOAL #1   Title  Independent with initial HEP    Status  New    Target Date  02/21/18      PT SHORT TERM  GOAL #2   Title  Pt will verbalize/demonstrate understanding of neutral shoulder posture to reduce risk fo impingement    Status  New    Target Date  02/21/18        PT Long Term Goals - 02/07/18 0848      PT LONG TERM GOAL #1   Title  Independent with ongoing/advanced HEP    Status  New    Target Date  03/24/18      PT LONG TERM GOAL #2   Title  R shoulder AROM equivalent to L without increased pain     Status  New    Target Date  03/24/18      PT LONG TERM GOAL #3   Title  R shoulder strength >/= 4+/5 w/o pain on MMT resistance     Status  New    Target Date  03/24/18      PT LONG TERM GOAL #4   Title  Pt will report ability to comb hair w/o limitation due to R shoulder LOM, weakness or pain    Status  New    Target Date  03/24/18      PT LONG TERM GOAL #5   Title  Pt will report ability to sleep on R side up to 4 hrs w/o limitation due to R shoulder pain    Status  New    Target Date  03/24/18             Plan - 02/07/18 0848    Clinical Impression Statement  Coletta is a 62 y/o female who presents to OP PT for R shoulder pain originating in late 2018. Pt received injection from MD in March 2019 but reports pain has continued to worsen and currently limits ADL performance and prevents her from sleeping on her R side. Deficits noted in R shoulder ROM, greatest in IR/ER, but with pain noted at end range for all motions. Pt also demonstrates postural abnormalities with increased muscle tension and decrease flexibility, as well as decreased strength in R shoulder with pain on all resisted MMT. Pt will benefit from skilled PT to address above listed deficits to restore functional use of R UE and allow return to sleeping on R side as preferred.    Clinical Presentation  Stable    Clinical Decision Making  Low    Rehab Potential  Good    PT Frequency  2x / week    PT Duration  6 weeks    PT Treatment/Interventions  Patient/family education;Neuromuscular  re-education;Therapeutic exercise;Therapeutic activities;ADLs/Self Care Home Management;Electrical Stimulation;Moist Heat;Cryotherapy;Vasopneumatic Device;Ultrasound;Iontophoresis 4mg /ml Dexamethasone;Manual techniques;Dry needling;Taping    Consulted and Agree with Plan of Care  Patient       Patient will benefit from skilled therapeutic intervention in order to improve the following deficits and impairments:  Pain, Decreased range of motion, Decreased strength, Postural dysfunction, Impaired UE functional use, Increased muscle spasms  Visit Diagnosis: Chronic right shoulder pain  Stiffness of right shoulder, not elsewhere classified  Abnormal posture  Muscle weakness (generalized)     Problem List Patient Active Problem List   Diagnosis  Date Noted  . Left hip pain 09/04/2017  . Right shoulder pain 09/04/2017  . Arthralgia of right temporomandibular joint 11/16/2016  . Sensorineural hearing loss (SNHL) of both ears 11/16/2016  . Tinnitus of left ear 11/16/2016  . Synovial cyst 08/07/2015  . Trochanteric bursitis of both hips 10/22/2014  . Plantar fasciitis, left 12/24/2013  . Left knee pain 10/15/2011    Percival Spanish, PT, MPT 02/07/2018, 3:48 PM  [facility] [address]  [address] [place], [place], [address] Phone: [phone]   Fax:  [phone]  Name: OCEANA WALTHALL MRN: [id number] Date of Birth: 08/20/1955

## 2018-02-08 ENCOUNTER — Other Ambulatory Visit: Payer: Self-pay | Admitting: Physician Assistant

## 2018-02-08 DIAGNOSIS — D485 Neoplasm of uncertain behavior of skin: Secondary | ICD-10-CM | POA: Diagnosis not present

## 2018-02-10 ENCOUNTER — Ambulatory Visit: Payer: 59

## 2018-02-10 DIAGNOSIS — M25511 Pain in right shoulder: Principal | ICD-10-CM

## 2018-02-10 DIAGNOSIS — R293 Abnormal posture: Secondary | ICD-10-CM | POA: Diagnosis not present

## 2018-02-10 DIAGNOSIS — M6281 Muscle weakness (generalized): Secondary | ICD-10-CM | POA: Diagnosis not present

## 2018-02-10 DIAGNOSIS — G8929 Other chronic pain: Secondary | ICD-10-CM | POA: Diagnosis not present

## 2018-02-10 DIAGNOSIS — M25611 Stiffness of right shoulder, not elsewhere classified: Secondary | ICD-10-CM

## 2018-02-10 NOTE — Therapy (Signed)
[facility] [address]  [place], [place], [address] Phone: [phone]   Fax:  [phone]  Physical Therapy Treatment  Patient Details  Name: Kendra Le MRN: [id number] Date of Birth: 15-Mar-1956 Referring Provider: Karlton Lemon, MD   Encounter Date: 02/10/2018  PT End of Session - 02/10/18 0858    Visit Number  2    Number of Visits  12    Date for PT Re-Evaluation  03/24/18    Authorization Type  [facility]    PT Start Time  0853    PT Stop Time  0931    PT Time Calculation (min)  38 min    Activity Tolerance  Patient tolerated treatment well    Behavior During Therapy  [facility] for tasks assessed/performed       Past Medical History:  Diagnosis Date  . GERD (gastroesophageal reflux disease)   . Hiatal hernia   . Migraine     Past Surgical History:  Procedure Laterality Date  . ABDOMINAL HYSTERECTOMY    . ANTERIOR CRUCIATE LIGAMENT REPAIR     left  . BREAST SURGERY    . CHOLECYSTECTOMY      There were no vitals filed for this visit.  Subjective Assessment - 02/10/18 0856    Subjective  Pt. doing well today.      Patient Stated Goals  "to be able to sleep on my R side at night & be able to comb my hair"    Currently in Pain?  Yes    Pain Score  5     Pain Location  Shoulder    Pain Orientation  Right;Lateral    Pain Descriptors / Indicators  Aching    Pain Type  Chronic pain    Multiple Pain Sites  No                       [facility] Adult PT Treatment/Exercise - 02/10/18 0921      Self-Care   Self-Care  Other Self-Care Comments    Other Self-Care Comments   self tennis ball release to posterior inferior shoulder on wall       Shoulder Exercises: Standing   External Rotation  10 reps;Right;Theraband    Theraband Level (Shoulder External Rotation)  Level 1 (Yellow)    External Rotation Limitations  isometric walk-outs    Internal Rotation  15 reps;Right;Theraband;Strengthening    Theraband Level (Shoulder Internal Rotation)  Level 1 (Yellow)    Extension  Theraband;10 reps;Both;Strengthening   3" hold    Theraband Level (Shoulder Extension)  Level 1 (Yellow)    Extension Limitations  cues for scap retraction    Row  10 reps;Both;Theraband;Strengthening    Theraband Level (Shoulder Row)  Level 2 (Red)      Manual Therapy   Manual Therapy  Soft tissue mobilization    Manual therapy comments  sidelying     Soft tissue mobilization  STM to R posterior/inferior shoulder in area of tenderness       Neck Exercises: Stretches   Upper Trapezius Stretch  Right;2 reps;30 seconds    Upper Trapezius Stretch Limitations  R hand anchored on seat               PT Short Term Goals - 02/10/18 6967      PT SHORT TERM GOAL #1   Title  Independent with initial HEP    Status  On-going  PT SHORT TERM GOAL #2   Title  Pt will verbalize/demonstrate understanding of neutral shoulder posture to reduce risk fo impingement    Status  On-going        PT Long Term Goals - 02/10/18 0858      PT LONG TERM GOAL #1   Title  Independent with ongoing/advanced HEP    Status  On-going      PT LONG TERM GOAL #2   Title  R shoulder AROM equivalent to L without increased pain     Status  On-going      PT LONG TERM GOAL #3   Title  R shoulder strength >/= 4+/5 w/o pain on MMT resistance     Status  On-going      PT LONG TERM GOAL #4   Title  Pt will report ability to comb hair w/o limitation due to R shoulder LOM, weakness or pain    Status  On-going      PT LONG TERM GOAL #5   Title  Pt will report ability to sleep on R side up to 4 hrs w/o limitation due to R shoulder pain    Status  On-going            Plan - 02/10/18 7654    Clinical Impression Statement  Pt. doing well today.  Tolerated mild progression of band resisted scapular and RTC strengthening well today.  Addressed tenderness/tightness in posterior/inferior shoulder with STM with good relief noted  following this.  Instructed pt. on self-ball release with tennis ball on wall to this area today.      PT Treatment/Interventions  Patient/family education;Neuromuscular re-education;Therapeutic exercise;Therapeutic activities;ADLs/Self Care Home Management;Electrical Stimulation;Moist Heat;Cryotherapy;Vasopneumatic Device;Ultrasound;Iontophoresis 4mg /ml Dexamethasone;Manual techniques;Dry needling;Taping    Consulted and Agree with Plan of Care  Patient       Patient will benefit from skilled therapeutic intervention in order to improve the following deficits and impairments:  Pain, Decreased range of motion, Decreased strength, Postural dysfunction, Impaired UE functional use, Increased muscle spasms  Visit Diagnosis: Chronic right shoulder pain  Stiffness of right shoulder, not elsewhere classified  Abnormal posture  Muscle weakness (generalized)     Problem List Patient Active Problem List   Diagnosis Date Noted  . Left hip pain 09/04/2017  . Right shoulder pain 09/04/2017  . Arthralgia of right temporomandibular joint 11/16/2016  . Sensorineural hearing loss (SNHL) of both ears 11/16/2016  . Tinnitus of left ear 11/16/2016  . Synovial cyst 08/07/2015  . Trochanteric bursitis of both hips 10/22/2014  . Plantar fasciitis, left 12/24/2013  . Left knee pain 10/15/2011    Bess Harvest, PTA 02/10/18 12:30 PM     [facility] [address]  [place], [place], [address] Phone: [phone]   Fax:  [phone]  Name: Kendra Le MRN: [id number] Date of Birth: [birth year]

## 2018-02-13 ENCOUNTER — Ambulatory Visit: Payer: 59 | Admitting: Physical Therapy

## 2018-02-13 ENCOUNTER — Encounter: Payer: Self-pay | Admitting: Physical Therapy

## 2018-02-13 DIAGNOSIS — R293 Abnormal posture: Secondary | ICD-10-CM | POA: Diagnosis not present

## 2018-02-13 DIAGNOSIS — M25611 Stiffness of right shoulder, not elsewhere classified: Secondary | ICD-10-CM

## 2018-02-13 DIAGNOSIS — G8929 Other chronic pain: Secondary | ICD-10-CM | POA: Diagnosis not present

## 2018-02-13 DIAGNOSIS — M25511 Pain in right shoulder: Secondary | ICD-10-CM | POA: Diagnosis not present

## 2018-02-13 DIAGNOSIS — M6281 Muscle weakness (generalized): Secondary | ICD-10-CM

## 2018-02-13 NOTE — Therapy (Signed)
[facility] [address]  [place], [place], [address] Phone: [phone]   Fax:  [phone]  Physical Therapy Treatment  Patient Details  Name: Kendra Le MRN: [id number] Date of Birth: [birth year] Referring Provider: Karlton Lemon, MD   Encounter Date: 02/13/2018  PT End of Session - 02/13/18 1619    Visit Number  3    Number of Visits  12    Date for PT Re-Evaluation  03/24/18    Authorization Type  [facility]    PT Start Time  1619    PT Stop Time  1710    PT Time Calculation (min)  51 min    Activity Tolerance  Patient tolerated treatment well    Behavior During Therapy  [facility] for tasks assessed/performed       Past Medical History:  Diagnosis Date  . GERD (gastroesophageal reflux disease)   . Hiatal hernia   . Migraine     Past Surgical History:  Procedure Laterality Date  . ABDOMINAL HYSTERECTOMY    . ANTERIOR CRUCIATE LIGAMENT REPAIR     left  . BREAST SURGERY    . CHOLECYSTECTOMY      There were no vitals filed for this visit.  Subjective Assessment - 02/13/18 1622    Subjective  Pt reporting her shoulder "is killing me today" - unaware of any triggering event.    Patient Stated Goals  "to be able to sleep on my R side at night & be able to comb my hair"    Currently in Pain?  Yes    Pain Score  9     Pain Location  Shoulder    Pain Orientation  Right;Lateral                       [facility] Adult PT Treatment/Exercise - 02/13/18 1619      Exercises   Exercises  Shoulder      Shoulder Exercises: Supine   Protraction  Right;10 reps    Other Supine Exercises  R shoulder circles at 90 dg flexion CW/CCW x10 each      Shoulder Exercises: Sidelying   External Rotation  Right;10 reps    External Rotation Limitations  VC & TC for scap retraction      Shoulder Exercises: ROM/Strengthening   UBE (Upper Arm Bike)  L1.5 x 1 min fwd - discontinued d/t high pain levels      Modalities   Modalities  Electrical Stimulation;Moist Heat      Moist Heat Therapy   Number Minutes Moist Heat  15 Minutes    Moist Heat Location  Shoulder      Electrical Stimulation   Electrical Stimulation Location  R shoulder complex    Electrical Stimulation Action  IFC    Electrical Stimulation Parameters  80-150 Hz, intensity to pt tol x15'    Electrical Stimulation Goals  Pain;Tone      Manual Therapy   Manual Therapy  Soft tissue mobilization;Myofascial release;Scapular mobilization;Joint mobilization    Manual therapy comments  supine & sidelying     Joint Mobilization  Grade 2 distraction for pain relief    Soft tissue mobilization  STM to R UT, LS, rhomboids, supra & infraspinatus, teres group, lateral deltoid & pecs    Myofascial Release  manual TPR t/o R shoulder complex, esp supra & infraspinatus, teres group, LS & lat deltoid    Scapular Mobilization  Grade 2 all directions  for pain relief      Neck Exercises: Stretches   Levator Stretch  Right;30 seconds;1 rep               PT Short Term Goals - 02/10/18 0858      PT SHORT TERM GOAL #1   Title  Independent with initial HEP    Status  On-going      PT SHORT TERM GOAL #2   Title  Pt will verbalize/demonstrate understanding of neutral shoulder posture to reduce risk fo impingement    Status  On-going        PT Long Term Goals - 02/10/18 0858      PT LONG TERM GOAL #1   Title  Independent with ongoing/advanced HEP    Status  On-going      PT LONG TERM GOAL #2   Title  R shoulder AROM equivalent to L without increased pain     Status  On-going      PT LONG TERM GOAL #3   Title  R shoulder strength >/= 4+/5 w/o pain on MMT resistance     Status  On-going      PT LONG TERM GOAL #4   Title  Pt will report ability to comb hair w/o limitation due to R shoulder LOM, weakness or pain    Status  On-going      PT LONG TERM GOAL #5   Title  Pt will report ability to sleep on R side up to 4 hrs w/o  limitation due to R shoulder pain    Status  On-going            Plan - 02/13/18 1655    Clinical Impression Statement  Summers arriving to PT today reporting significant increase in R shoulder pain w/o identifiable triggering event. Pt reporting throbbing pain mosltly localized to lateral R shoulder, but ttp with severall TPs identified throughout R shoulder complex. Majority of session focusing on STM and joint mobs to decrease muscle spasms and pain with good relief noted and pt able to tolerate basic scapular stabilization following this w/o increaseed pain. Treatment concluded with estim and moist heat to promote further muscle relaxation with pt reporting significant reduction in pain by end of session.    Rehab Potential  Good    PT Treatment/Interventions  Patient/family education;Neuromuscular re-education;Therapeutic exercise;Therapeutic activities;ADLs/Self Care Home Management;Electrical Stimulation;Moist Heat;Cryotherapy;Vasopneumatic Device;Ultrasound;Iontophoresis 4mg /ml Dexamethasone;Manual techniques;Dry needling;Taping    Consulted and Agree with Plan of Care  Patient       Patient will benefit from skilled therapeutic intervention in order to improve the following deficits and impairments:  Pain, Decreased range of motion, Decreased strength, Postural dysfunction, Impaired UE functional use, Increased muscle spasms  Visit Diagnosis: Chronic right shoulder pain  Stiffness of right shoulder, not elsewhere classified  Abnormal posture  Muscle weakness (generalized)     Problem List Patient Active Problem List   Diagnosis Date Noted  . Left hip pain 09/04/2017  . Right shoulder pain 09/04/2017  . Arthralgia of right temporomandibular joint 11/16/2016  . Sensorineural hearing loss (SNHL) of both ears 11/16/2016  . Tinnitus of left ear 11/16/2016  . Synovial cyst 08/07/2015  . Trochanteric bursitis of both hips 10/22/2014  . Plantar fasciitis, left 12/24/2013  .  Left knee pain 10/15/2011    Percival Spanish, PT, MPT 02/13/2018, 6:54 PM  [facility] [address]  [address] [place], [place], [address] Phone: [phone]  Fax:  [phone]  Name: Kendra Le MRN: [id number] Date of Birth: 08/05/1955

## 2018-02-16 ENCOUNTER — Ambulatory Visit: Payer: 59

## 2018-02-16 DIAGNOSIS — M6281 Muscle weakness (generalized): Secondary | ICD-10-CM | POA: Diagnosis not present

## 2018-02-16 DIAGNOSIS — G8929 Other chronic pain: Secondary | ICD-10-CM | POA: Diagnosis not present

## 2018-02-16 DIAGNOSIS — R293 Abnormal posture: Secondary | ICD-10-CM | POA: Diagnosis not present

## 2018-02-16 DIAGNOSIS — M25611 Stiffness of right shoulder, not elsewhere classified: Secondary | ICD-10-CM | POA: Diagnosis not present

## 2018-02-16 DIAGNOSIS — M25511 Pain in right shoulder: Principal | ICD-10-CM

## 2018-02-16 NOTE — Therapy (Signed)
[facility] [address]  [place], [place], [address] Phone: [phone]   Fax:  [phone]  Physical Therapy Treatment  Patient Details  Name: Kendra Le MRN: [id number] Date of Birth: 10-17-1955 Referring Provider: Karlton Lemon, MD   Encounter Date: 02/16/2018  PT End of Session - 02/16/18 1826    Visit Number  4    Number of Visits  12    Date for PT Re-Evaluation  03/24/18    Authorization Type  [facility]    PT Start Time  1532    PT Stop Time  1630    PT Time Calculation (min)  58 min    Activity Tolerance  Patient tolerated treatment well    Behavior During Therapy  [facility] for tasks assessed/performed       Past Medical History:  Diagnosis Date  . GERD (gastroesophageal reflux disease)   . Hiatal hernia   . Migraine     Past Surgical History:  Procedure Laterality Date  . ABDOMINAL HYSTERECTOMY    . ANTERIOR CRUCIATE LIGAMENT REPAIR     left  . BREAST SURGERY    . CHOLECYSTECTOMY      There were no vitals filed for this visit.  Subjective Assessment - 02/16/18 1819    Subjective  Pt. noting improvement in pain since last visit.      Patient Stated Goals  "to be able to sleep on my R side at night & be able to comb my hair"    Currently in Pain?  Yes    Pain Score  6     Pain Location  Shoulder    Pain Orientation  Lateral    Pain Descriptors / Indicators  Aching    Pain Type  Chronic pain    Pain Radiating Towards  down to mid upper lateral arm     Pain Onset  More than a month ago    Pain Frequency  Constant    Aggravating Factors   fixing her hair    Multiple Pain Sites  No                       [facility] Adult PT Treatment/Exercise - 02/16/18 1823      Shoulder Exercises: Seated   Retraction  Both;10 reps    Retraction Limitations  5" hold       Shoulder Exercises: Prone   Retraction  Both;10 reps      Moist Heat Therapy   Number Minutes Moist Heat  15 Minutes    Moist Heat Location  Shoulder   upper back      Electrical Stimulation   Electrical Stimulation Location  R shoulder complex    Electrical Stimulation Action  IFC    Electrical Stimulation Parameters  80-150Hz , intensity to pt. tolerance, 15'    Electrical Stimulation Goals  Pain;Tone      Manual Therapy   Manual Therapy  Soft tissue mobilization;Myofascial release;Scapular mobilization;Joint mobilization    Manual therapy comments  supine & sidelying     Soft tissue mobilization  STM to R UT, rhomboids, & infraspinatus, teres group, lateral deltoid & pecs; most tenderness in lateral deltoid, UT, and mid lateral triceps     Myofascial Release  TPR to R UT, R mid/lateral triceps       Neck Exercises: Stretches   Upper Trapezius Stretch  Right;2 reps;30 seconds  PT Short Term Goals - 02/10/18 6269      PT SHORT TERM GOAL #1   Title  Independent with initial HEP    Status  On-going      PT SHORT TERM GOAL #2   Title  Pt will verbalize/demonstrate understanding of neutral shoulder posture to reduce risk fo impingement    Status  On-going        PT Long Term Goals - 02/10/18 0858      PT LONG TERM GOAL #1   Title  Independent with ongoing/advanced HEP    Status  On-going      PT LONG TERM GOAL #2   Title  R shoulder AROM equivalent to L without increased pain     Status  On-going      PT LONG TERM GOAL #3   Title  R shoulder strength >/= 4+/5 w/o pain on MMT resistance     Status  On-going      PT LONG TERM GOAL #4   Title  Pt will report ability to comb hair w/o limitation due to R shoulder LOM, weakness or pain    Status  On-going      PT LONG TERM GOAL #5   Title  Pt will report ability to sleep on R side up to 4 hrs w/o limitation due to R shoulder pain    Status  On-going            Plan - 02/16/18 1831    Clinical Impression Statement  Pt. noting good improvement in pain levels since last visits manual therapy.  Continued to demo  increased muscular tension/tenderness throughout scapular and shoulder complex and R proximal UE thus addressed this with STM/TPR with good relief noted.  Ended visit with E-stim/moist heat to promote relaxation of upper shoulder musculature.  Will continue to progress toward goals.      PT Treatment/Interventions  Patient/family education;Neuromuscular re-education;Therapeutic exercise;Therapeutic activities;ADLs/Self Care Home Management;Electrical Stimulation;Moist Heat;Cryotherapy;Vasopneumatic Device;Ultrasound;Iontophoresis 4mg /ml Dexamethasone;Manual techniques;Dry needling;Taping    Consulted and Agree with Plan of Care  Patient       Patient will benefit from skilled therapeutic intervention in order to improve the following deficits and impairments:  Pain, Decreased range of motion, Decreased strength, Postural dysfunction, Impaired UE functional use, Increased muscle spasms  Visit Diagnosis: Chronic right shoulder pain  Stiffness of right shoulder, not elsewhere classified  Abnormal posture  Muscle weakness (generalized)     Problem List Patient Active Problem List   Diagnosis Date Noted  . Left hip pain 09/04/2017  . Right shoulder pain 09/04/2017  . Arthralgia of right temporomandibular joint 11/16/2016  . Sensorineural hearing loss (SNHL) of both ears 11/16/2016  . Tinnitus of left ear 11/16/2016  . Synovial cyst 08/07/2015  . Trochanteric bursitis of both hips 10/22/2014  . Plantar fasciitis, left 12/24/2013  . Left knee pain 10/15/2011    Bess Harvest, PTA 02/16/18 6:39 PM   [facility] [address]  [place], [place], [address] Phone: [phone]   Fax:  [phone]  Name: Kendra Le MRN: [id number] Date of Birth: 1956/04/10

## 2018-02-17 ENCOUNTER — Ambulatory Visit: Payer: 59

## 2018-02-21 ENCOUNTER — Ambulatory Visit: Payer: 59 | Attending: Family Medicine

## 2018-02-21 DIAGNOSIS — M6281 Muscle weakness (generalized): Secondary | ICD-10-CM | POA: Diagnosis not present

## 2018-02-21 DIAGNOSIS — M25511 Pain in right shoulder: Secondary | ICD-10-CM | POA: Insufficient documentation

## 2018-02-21 DIAGNOSIS — M25611 Stiffness of right shoulder, not elsewhere classified: Secondary | ICD-10-CM | POA: Diagnosis not present

## 2018-02-21 DIAGNOSIS — R293 Abnormal posture: Secondary | ICD-10-CM

## 2018-02-21 DIAGNOSIS — G8929 Other chronic pain: Secondary | ICD-10-CM | POA: Diagnosis not present

## 2018-02-21 NOTE — Therapy (Signed)
[facility] [address]  [place], [place], [address] Phone: [phone]   Fax:  [phone]  Physical Therapy Treatment  Patient Details  Name: Kendra Le MRN: [id number] Date of Birth: 07-Apr-1956 Referring Provider: Karlton Lemon, MD   Encounter Date: 02/21/2018  PT End of Session - 02/21/18 1451    Visit Number  5    Number of Visits  12    Date for PT Re-Evaluation  03/24/18    Authorization Type  [facility]    PT Start Time  1445    PT Stop Time  1540   Ended visit with 10 min moist heat   PT Time Calculation (min)  55 min    Activity Tolerance  Patient tolerated treatment well    Behavior During Therapy  [facility] for tasks assessed/performed       Past Medical History:  Diagnosis Date  . GERD (gastroesophageal reflux disease)   . Hiatal hernia   . Migraine     Past Surgical History:  Procedure Laterality Date  . ABDOMINAL HYSTERECTOMY    . ANTERIOR CRUCIATE LIGAMENT REPAIR     left  . BREAST SURGERY    . CHOLECYSTECTOMY      There were no vitals filed for this visit.  Subjective Assessment - 02/21/18 1448    Subjective  Pt. reporting tenderness and lateral arm pain continues worst on Sunday.      Patient Stated Goals  "to be able to sleep on my R side at night & be able to comb my hair"    Currently in Pain?  Yes    Pain Score  6     Pain Location  Shoulder    Pain Orientation  Lateral;Right    Pain Descriptors / Indicators  Aching    Pain Onset  More than a month ago    Pain Frequency  Constant                       [facility] Adult PT Treatment/Exercise - 02/21/18 1532      Self-Care   Self-Care  Other Self-Care Comments    Other Self-Care Comments   Discussion of use of tennis ball to posterior inferior shoulder for hopeful reduction in tenderness/tone; discussed stopping off on band IR and isometric ER step out as pt. noting increased pain with these home activities      Shoulder Exercises: Supine   Horizontal ABduction  Both;10 reps;Theraband;Strengthening    Theraband Level (Shoulder Horizontal ABduction)  Level 1 (Yellow)    Horizontal ABduction Limitations  Hooklying on table with cues for scap. retraction    Performed in low plane of movement with band pull at waist      Moist Heat Therapy   Number Minutes Moist Heat  10 Minutes    Moist Heat Location  Cervical      Cryotherapy   Number Minutes Cryotherapy  10 Minutes    Cryotherapy Location  Shoulder   R   Type of Cryotherapy  Ice pack      Manual Therapy   Manual Therapy  Soft tissue mobilization;Myofascial release;Passive ROM    Manual therapy comments  supine & sidelying     Joint Mobilization  Grade 2 distraction for pain relief    Soft tissue mobilization  STM to R cervical paraspinals, UT, LS, rhomboids, [place], infraspinatus, supraspinatus, mid triceps, proximal triceps in area of most tenderness  Myofascial Release  TPR to R mid triceps, R UT, R anterior deltoid, R infraspinatus     Passive ROM  R UT, LS manual stretch with therapist x 30 sec each way                PT Short Term Goals - 02/21/18 1518      PT SHORT TERM GOAL #1   Title  Independent with initial HEP    Status  Achieved      PT SHORT TERM GOAL #2   Title  Pt will verbalize/demonstrate understanding of neutral shoulder posture to reduce risk fo impingement    Status  Achieved        PT Long Term Goals - 02/10/18 0858      PT LONG TERM GOAL #1   Title  Independent with ongoing/advanced HEP    Status  On-going      PT LONG TERM GOAL #2   Title  R shoulder AROM equivalent to L without increased pain     Status  On-going      PT LONG TERM GOAL #3   Title  R shoulder strength >/= 4+/5 w/o pain on MMT resistance     Status  On-going      PT LONG TERM GOAL #4   Title  Pt will report ability to comb hair w/o limitation due to R shoulder LOM, weakness or pain    Status  On-going      PT LONG  TERM GOAL #5   Title  Pt will report ability to sleep on R side up to 4 hrs w/o limitation due to R shoulder pain    Status  On-going            Plan - 02/21/18 1514    Clinical Impression Statement  Pt. reporting she felt reduction in pain for two days following last visit however noted increased pain on Sunday after using R UE to fix hair.  Pt. primary concern today is ongoing aching R lateral shoulder pain and with palpable TP's throughout R UE and R upper shoulder.  Manual therapy addressing R shoulder pain and muscular tension with good reduction following this.  Pt. does continue to have poor tolerance for band resisted strengthening activities in session and complained of increased R shoulder pain following band resisted IR/ER HEP activities.  Pt. encouraged to terminate HEP activities that cause her increased pain until further notice.  Ended visit with ice to R shoulder and moist heat pack to cervical area for hopeful reduction in pain and tone.  Will monitor response in coming visit.      PT Treatment/Interventions  Patient/family education;Neuromuscular re-education;Therapeutic exercise;Therapeutic activities;ADLs/Self Care Home Management;Electrical Stimulation;Moist Heat;Cryotherapy;Vasopneumatic Device;Ultrasound;Iontophoresis 4mg /ml Dexamethasone;Manual techniques;Dry needling;Taping    Consulted and Agree with Plan of Care  Patient       Patient will benefit from skilled therapeutic intervention in order to improve the following deficits and impairments:  Pain, Decreased range of motion, Decreased strength, Postural dysfunction, Impaired UE functional use, Increased muscle spasms  Visit Diagnosis: Chronic right shoulder pain  Stiffness of right shoulder, not elsewhere classified  Abnormal posture  Muscle weakness (generalized)     Problem List Patient Active Problem List   Diagnosis Date Noted  . Left hip pain 09/04/2017  . Right shoulder pain 09/04/2017  .  Arthralgia of right temporomandibular joint 11/16/2016  . Sensorineural hearing loss (SNHL) of both ears 11/16/2016  . Tinnitus of left ear 11/16/2016  . Synovial  cyst 08/07/2015  . Trochanteric bursitis of both hips 10/22/2014  . Plantar fasciitis, left 12/24/2013  . Left knee pain 10/15/2011    Bess Harvest, PTA 02/21/18 4:47 PM   [facility] [address]  [place], [place], [address] Phone: [phone]   Fax:  [phone]  Name: Kendra Le MRN: [id number] Date of Birth: 1955/10/17

## 2018-02-24 ENCOUNTER — Ambulatory Visit: Payer: 59 | Admitting: Physical Therapy

## 2018-02-24 DIAGNOSIS — M25511 Pain in right shoulder: Principal | ICD-10-CM

## 2018-02-24 DIAGNOSIS — R293 Abnormal posture: Secondary | ICD-10-CM

## 2018-02-24 DIAGNOSIS — M6281 Muscle weakness (generalized): Secondary | ICD-10-CM

## 2018-02-24 DIAGNOSIS — G8929 Other chronic pain: Secondary | ICD-10-CM | POA: Diagnosis not present

## 2018-02-24 DIAGNOSIS — M25611 Stiffness of right shoulder, not elsewhere classified: Secondary | ICD-10-CM | POA: Diagnosis not present

## 2018-02-24 NOTE — Patient Instructions (Signed)

## 2018-02-24 NOTE — Therapy (Signed)
[facility] [address]  [place], [place], [address] Phone: [phone]   Fax:  [phone]  Physical Therapy Treatment  Patient Details  Name: Kendra Le MRN: [id number] Date of Birth: Aug 31, 1955 Referring Provider: Karlton Lemon, MD   Encounter Date: 02/24/2018  PT End of Session - 02/24/18 1017    Visit Number  6    Number of Visits  12    Date for PT Re-Evaluation  03/24/18    Authorization Type  [facility]    PT Start Time  1017    PT Stop Time  1116    PT Time Calculation (min)  59 min    Activity Tolerance  Patient tolerated treatment well    Behavior During Therapy  [facility] for tasks assessed/performed       Past Medical History:  Diagnosis Date  . GERD (gastroesophageal reflux disease)   . Hiatal hernia   . Migraine     Past Surgical History:  Procedure Laterality Date  . ABDOMINAL HYSTERECTOMY    . ANTERIOR CRUCIATE LIGAMENT REPAIR     left  . BREAST SURGERY    . CHOLECYSTECTOMY      There were no vitals filed for this visit.                    [facility] Adult PT Treatment/Exercise - 02/24/18 1017      Exercises   Exercises  Shoulder      Modalities   Modalities  Electrical Stimulation;Moist Heat      Moist Heat Therapy   Number Minutes Moist Heat  15 Minutes    Moist Heat Location  Shoulder      Electrical Stimulation   Electrical Stimulation Location  R shoulder complex    Electrical Stimulation Action  IFC    Electrical Stimulation Parameters  80-150 Hz, intensity to pt tol x15'    Electrical Stimulation Goals  Pain;Tone      Manual Therapy   Manual Therapy  Soft tissue mobilization;Myofascial release;Scapular mobilization    Manual therapy comments  supine, sidelying & prone    Soft tissue mobilization  STM to R UT, LS, rhomboids, supra & infraspinatus, teres group, lateral deltoid, lateral pecs & biceps/triceps    Myofascial Release  manual TPR t/o R shoulder  complex, esp supra & infraspinatus, teres group, LS & lat deltoid    Scapular Mobilization  Grade 2 all directions for pain relief       Trigger Point Dry Needling - 02/24/18 1017    Consent Given?  Yes    Education Handout Provided  Yes    Muscles Treated Upper Body  Supraspinatus;Subscapularis;Pectoralis major   R biceps/triceps   Pectoralis Major Response  Twitch response elicited;Palpable increased muscle length   Rt lateral pec major   Supraspinatus Response  Twitch response elicited;Palpable increased muscle length   Rt   Subscapularis Response  Twitch response elicited;Palpable increased muscle length   Rt          PT Education - 02/24/18 1025    Education Details  Role of DN, precautions & contraindications, expected response to treatment and recommended post-treatment activity/exercise level    Person(s) Educated  Patient    Methods  Explanation    Comprehension  Verbalized understanding       PT Short Term Goals - 02/21/18 1518      PT SHORT TERM GOAL #1   Title  Independent with initial HEP  Status  Achieved      PT SHORT TERM GOAL #2   Title  Pt will verbalize/demonstrate understanding of neutral shoulder posture to reduce risk fo impingement    Status  Achieved        PT Long Term Goals - 02/10/18 0858      PT LONG TERM GOAL #1   Title  Independent with ongoing/advanced HEP    Status  On-going      PT LONG TERM GOAL #2   Title  R shoulder AROM equivalent to L without increased pain     Status  On-going      PT LONG TERM GOAL #3   Title  R shoulder strength >/= 4+/5 w/o pain on MMT resistance     Status  On-going      PT LONG TERM GOAL #4   Title  Pt will report ability to comb hair w/o limitation due to R shoulder LOM, weakness or pain    Status  On-going      PT LONG TERM GOAL #5   Title  Pt will report ability to sleep on R side up to 4 hrs w/o limitation due to R shoulder pain    Status  On-going            Plan - 02/24/18  1116    Clinical Impression Statement  Nelva reporting good pain relief following manual therapy and STM during therapy sessions sometime lasting a couple days, but continues to experience return of more severe pain w/o known precipitating activities. Significant increased muscle tension persists t/o R shoulder complex with multiple TPs identified - initiated DN with manual therapy today after informed pt consent with positive twitch responses elicited and pt reporting less pain/sensitivity to touch following this. Concluded session with estim and moist heat to promote further muscle relaxation.    Rehab Potential  Good    PT Treatment/Interventions  Patient/family education;Neuromuscular re-education;Therapeutic exercise;Therapeutic activities;ADLs/Self Care Home Management;Electrical Stimulation;Moist Heat;Cryotherapy;Vasopneumatic Device;Ultrasound;Iontophoresis 4mg /ml Dexamethasone;Manual techniques;Dry needling;Taping    Consulted and Agree with Plan of Care  Patient       Patient will benefit from skilled therapeutic intervention in order to improve the following deficits and impairments:  Pain, Decreased range of motion, Decreased strength, Postural dysfunction, Impaired UE functional use, Increased muscle spasms  Visit Diagnosis: Chronic right shoulder pain  Stiffness of right shoulder, not elsewhere classified  Abnormal posture  Muscle weakness (generalized)     Problem List Patient Active Problem List   Diagnosis Date Noted  . Left hip pain 09/04/2017  . Right shoulder pain 09/04/2017  . Arthralgia of right temporomandibular joint 11/16/2016  . Sensorineural hearing loss (SNHL) of both ears 11/16/2016  . Tinnitus of left ear 11/16/2016  . Synovial cyst 08/07/2015  . Trochanteric bursitis of both hips 10/22/2014  . Plantar fasciitis, left 12/24/2013  . Left knee pain 10/15/2011    Percival Spanish, PT, MPT 02/24/2018, 11:32 AM  [facility] [address]  [place], [place], [address] Phone: [phone]   Fax:  [phone]  Name: Kendra Le MRN: [id number] Date of Birth: 23-Dec-1955

## 2018-02-27 ENCOUNTER — Encounter: Payer: Self-pay | Admitting: Physical Therapy

## 2018-02-27 ENCOUNTER — Ambulatory Visit: Payer: 59 | Admitting: Physical Therapy

## 2018-02-27 DIAGNOSIS — M6281 Muscle weakness (generalized): Secondary | ICD-10-CM | POA: Diagnosis not present

## 2018-02-27 DIAGNOSIS — R293 Abnormal posture: Secondary | ICD-10-CM | POA: Diagnosis not present

## 2018-02-27 DIAGNOSIS — G8929 Other chronic pain: Secondary | ICD-10-CM | POA: Diagnosis not present

## 2018-02-27 DIAGNOSIS — M25611 Stiffness of right shoulder, not elsewhere classified: Secondary | ICD-10-CM | POA: Diagnosis not present

## 2018-02-27 DIAGNOSIS — M25511 Pain in right shoulder: Principal | ICD-10-CM

## 2018-02-27 NOTE — Therapy (Signed)
[facility] [address]  [place], [place], [address] Phone: [phone]   Fax:  [phone]  Physical Therapy Treatment  Patient Details  Name: Kendra Le MRN: [id number] Date of Birth: 1955-09-23 Referring Provider: Karlton Lemon, MD   Encounter Date: 02/27/2018  PT End of Session - 02/27/18 1058    Visit Number  7    Number of Visits  12    Date for PT Re-Evaluation  03/24/18    Authorization Type  [facility]    PT Start Time  1058    PT Stop Time  1200    PT Time Calculation (min)  62 min    Activity Tolerance  Patient tolerated treatment well    Behavior During Therapy  [facility] for tasks assessed/performed       Past Medical History:  Diagnosis Date  . GERD (gastroesophageal reflux disease)   . Hiatal hernia   . Migraine     Past Surgical History:  Procedure Laterality Date  . ABDOMINAL HYSTERECTOMY    . ANTERIOR CRUCIATE LIGAMENT REPAIR     left  . BREAST SURGERY    . CHOLECYSTECTOMY      There were no vitals filed for this visit.  Subjective Assessment - 02/27/18 1100    Subjective  Pt reporting "night & day" difference after DN last session with significant relief of pain and improved tolerance for HEP. She notes some continued areas of tightness, primarily in posterior lateral arm.    Patient Stated Goals  "to be able to sleep on my R side at night & be able to comb my hair"    Currently in Pain?  Yes    Pain Score  5     Pain Location  Shoulder    Pain Orientation  Right;Upper;Posterior;Lateral    Pain Descriptors / Indicators  Aching    Pain Type  Chronic pain    Pain Frequency  Constant   but less intense                      [facility] Adult PT Treatment/Exercise - 02/27/18 1058      Exercises   Exercises  Shoulder;Elbow      Elbow Exercises   Elbow Extension  Right;10 reps;Bar weights/barbell    Bar Weights/Barbell (Elbow Extension)  2 lbs    Elbow Extension Limitations   prrone      Shoulder Exercises: Prone   Extension  Right;10 reps;Weights;Strengthening    Extension Weight (lbs)  2    Extension Limitations  over edge of table    Horizontal ABduction 1  Right    Horizontal ABduction 1 Weight (lbs)  1    Horizontal ABduction 1 Limitations  deferred over edge of table d/t increased anterior shoulder pain    Other Prone Exercises  Prone bilateral I's, T's (thumbs down) & Y's over green Pball x 10 each      Shoulder Exercises: Standing   External Rotation  Right;10 reps;Theraband;Strengthening    Theraband Level (Shoulder External Rotation)  Level 1 (Yellow)    External Rotation Limitations  neutral shoulder with roll under elbow    Internal Rotation  Right;10 reps;Theraband;Strengthening    Theraband Level (Shoulder Internal Rotation)  Level 1 (Yellow)    Internal Rotation Limitations  neutral shoulder with roll under elbow    Other Standing Exercises  R shoulder flexion cabinet reach with 1# 2 x 10 - 1st set with L  hand placement of 1# db on shelf & eccentric lowering with R hand; 2nd set with R hand only      Shoulder Exercises: Therapy Ball   Other Therapy Ball Exercises  B serratus roll-up with orange ball on wall x10      Shoulder Exercises: ROM/Strengthening   UBE (Upper Arm Bike)  L1.0 x 4 min (2' fwd/back)    Wall Pushups  10 reps    Wall Pushups Limitations  push-up plus      Shoulder Exercises: Stretch   Corner Stretch  30 seconds;1 rep   each   Corner Stretch Limitations  low & high doorway stretch - R arm only (mid position deferred d/t increased pain)      Modalities   Modalities  Electrical Stimulation;Moist Heat      Moist Heat Therapy   Number Minutes Moist Heat  15 Minutes    Moist Heat Location  --   & R upper arm     Electrical Stimulation   Electrical Stimulation Location  R shoulder complex + triceps    Electrical Stimulation Action  IFC    Electrical Stimulation Parameters  80-150 Hz, intensity to pt tol x15'     Electrical Stimulation Goals  Pain;Tone      Manual Therapy   Manual Therapy  Soft tissue mobilization;Myofascial release    Soft tissue mobilization  STM to R triceps    Myofascial Release  manual TPR to R mid belly triceps       Trigger Point Dry Needling - 02/27/18 1058    Consent Given?  Yes    Muscles Treated Upper Body  --   Rt Triceps: + twitch, palp reduced muscle tension            PT Short Term Goals - 02/21/18 1518      PT SHORT TERM GOAL #1   Title  Independent with initial HEP    Status  Achieved      PT SHORT TERM GOAL #2   Title  Pt will verbalize/demonstrate understanding of neutral shoulder posture to reduce risk fo impingement    Status  Achieved        PT Long Term Goals - 02/10/18 0858      PT LONG TERM GOAL #1   Title  Independent with ongoing/advanced HEP    Status  On-going      PT LONG TERM GOAL #2   Title  R shoulder AROM equivalent to L without increased pain     Status  On-going      PT LONG TERM GOAL #3   Title  R shoulder strength >/= 4+/5 w/o pain on MMT resistance     Status  On-going      PT LONG TERM GOAL #4   Title  Pt will report ability to comb hair w/o limitation due to R shoulder LOM, weakness or pain    Status  On-going      PT LONG TERM GOAL #5   Title  Pt will report ability to sleep on R side up to 4 hrs w/o limitation due to R shoulder pain    Status  On-going            Plan - 02/27/18 1103    Clinical Impression Statement  Kendra Le reporting significant relief of pain with manual therapy and DN last session lasting through the weekend and allowing for increased tolerance for HEP. Still noting tender point in R triceps with TP identified -  addressed with DN and manual therapy with decreased ttp after this. Remainder of session focusing on scapular and RTC strengthening - better tolerance overal, but still experiencing increased pain with R shoulder horiz abduction with arm in ER.    Rehab Potential  Good     PT Treatment/Interventions  Patient/family education;Neuromuscular re-education;Therapeutic exercise;Therapeutic activities;ADLs/Self Care Home Management;Electrical Stimulation;Moist Heat;Cryotherapy;Vasopneumatic Device;Ultrasound;Iontophoresis 4mg /ml Dexamethasone;Manual techniques;Dry needling;Taping    Consulted and Agree with Plan of Care  Patient       Patient will benefit from skilled therapeutic intervention in order to improve the following deficits and impairments:  Pain, Decreased range of motion, Decreased strength, Postural dysfunction, Impaired UE functional use, Increased muscle spasms  Visit Diagnosis: Chronic right shoulder pain  Stiffness of right shoulder, not elsewhere classified  Abnormal posture  Muscle weakness (generalized)     Problem List Patient Active Problem List   Diagnosis Date Noted  . Left hip pain 09/04/2017  . Right shoulder pain 09/04/2017  . Arthralgia of right temporomandibular joint 11/16/2016  . Sensorineural hearing loss (SNHL) of both ears 11/16/2016  . Tinnitus of left ear 11/16/2016  . Synovial cyst 08/07/2015  . Trochanteric bursitis of both hips 10/22/2014  . Plantar fasciitis, left 12/24/2013  . Left knee pain 10/15/2011    Percival Spanish, PT, MPT 02/27/2018, 12:20 PM  [facility] [address]  [address] [place], [place], [address] Phone: [phone]   Fax:  [phone]  Name: Kendra Le MRN: [id number] Date of Birth: [birth year]

## 2018-03-01 ENCOUNTER — Ambulatory Visit: Payer: 59

## 2018-03-01 DIAGNOSIS — M25611 Stiffness of right shoulder, not elsewhere classified: Secondary | ICD-10-CM

## 2018-03-01 DIAGNOSIS — G8929 Other chronic pain: Secondary | ICD-10-CM | POA: Diagnosis not present

## 2018-03-01 DIAGNOSIS — M6281 Muscle weakness (generalized): Secondary | ICD-10-CM | POA: Diagnosis not present

## 2018-03-01 DIAGNOSIS — M25511 Pain in right shoulder: Secondary | ICD-10-CM | POA: Diagnosis not present

## 2018-03-01 DIAGNOSIS — R293 Abnormal posture: Secondary | ICD-10-CM | POA: Diagnosis not present

## 2018-03-01 NOTE — Therapy (Signed)
[facility] [address]  [place], [place], [address] Phone: [phone]   Fax:  [phone]  Physical Therapy Treatment  Patient Details  Name: Kendra Le MRN: [id number] Date of Birth: 1955-08-19 Referring Provider: Karlton Lemon, MD   Encounter Date: 03/01/2018  PT End of Session - 03/01/18 0935    Visit Number  8    Number of Visits  12    Date for PT Re-Evaluation  03/24/18    Authorization Type  [facility]    PT Start Time  0930    PT Stop Time  1040    PT Time Calculation (min)  70 min    Activity Tolerance  Patient tolerated treatment well    Behavior During Therapy  [facility] for tasks assessed/performed       Past Medical History:  Diagnosis Date  . GERD (gastroesophageal reflux disease)   . Hiatal hernia   . Migraine     Past Surgical History:  Procedure Laterality Date  . ABDOMINAL HYSTERECTOMY    . ANTERIOR CRUCIATE LIGAMENT REPAIR     left  . BREAST SURGERY    . CHOLECYSTECTOMY      There were no vitals filed for this visit.  Subjective Assessment - 03/01/18 0932    Subjective  Pt. reporting some continued R lateral arm pain today which still prevents her from sleeping on R side at night.     Patient Stated Goals  "to be able to sleep on my R side at night & be able to comb my hair"    Currently in Pain?  Yes    Pain Score  6     Pain Location  Shoulder    Pain Orientation  Right;Upper;Lateral;Posterior    Pain Descriptors / Indicators  Aching   "tooth ache"   Pain Type  Chronic pain    Pain Onset  More than a month ago    Pain Frequency  Constant    Aggravating Factors   Fixing her hair     Pain Relieving Factors  Aleve, Flexeril     Multiple Pain Sites  No                       [facility] Adult PT Treatment/Exercise - 03/01/18 1002      Elbow Exercises   Elbow Flexion  Right;Theraband;Standing;20 reps    Theraband Level (Elbow Flexion)  Level 2 (Red)    Elbow Flexion  Limitations  bolster under elbow in neutral with forearm supination at top of motion     Elbow Extension  Right;20 reps    Theraband Level (Elbow Extension)  Level 2 (Red)      Shoulder Exercises: Supine   Protraction  Both;20 reps;Weights    Protraction Weight (lbs)  3      Shoulder Exercises: Standing   External Rotation  Right;15 reps;Strengthening;Theraband    Theraband Level (Shoulder External Rotation)  Level 1 (Yellow)    External Rotation Limitations  isometric step outs due to pt. with pool tolerance for active ER     Internal Rotation  Right;15 reps;Theraband    Theraband Level (Shoulder Internal Rotation)  Level 1 (Yellow)    Internal Rotation Limitations  neutral shoulder with roll under elbow    Extension  Both;15 reps;Theraband    Theraband Level (Shoulder Extension)  Level 1 (Yellow)    Extension Limitations  scapular retraction cues required  Shoulder Exercises: ROM/Strengthening   UBE (Upper Arm Bike)  L1.0 x 6 min (3' fwd/back)    Wall Pushups  10 reps    Wall Pushups Limitations  wall pushup       Shoulder Exercises: Stretch   Corner Stretch  30 seconds;1 rep      Moist Heat Therapy   Number Minutes Moist Heat  15 Minutes    Moist Heat Location  --   R uppder arm      Electrical Stimulation   Electrical Stimulation Location  R shoulder complex + triceps    Electrical Stimulation Action  Premod     Electrical Stimulation Parameters  10-20Hz , intensity to pt. tolerance    Electrical Stimulation Goals  Pain;Tone      Manual Therapy   Manual Therapy  Soft tissue mobilization;Myofascial release    Manual therapy comments  supine, sidelying     Soft tissue mobilization  STM to R triceps, lateral biceps, R UT, R shoulder complex, R posterior inferior shoulder; most tenderness in R lateral UE biceps/triceps     Myofascial Release  Manual TPR to R UT, R lateral biceps, R triceps; limited response however some improvement in comfort following                 PT Short Term Goals - 02/21/18 1518      PT SHORT TERM GOAL #1   Title  Independent with initial HEP    Status  Achieved      PT SHORT TERM GOAL #2   Title  Pt will verbalize/demonstrate understanding of neutral shoulder posture to reduce risk fo impingement    Status  Achieved        PT Long Term Goals - 02/10/18 0858      PT LONG TERM GOAL #1   Title  Independent with ongoing/advanced HEP    Status  On-going      PT LONG TERM GOAL #2   Title  R shoulder AROM equivalent to L without increased pain     Status  On-going      PT LONG TERM GOAL #3   Title  R shoulder strength >/= 4+/5 w/o pain on MMT resistance     Status  On-going      PT LONG TERM GOAL #4   Title  Pt will report ability to comb hair w/o limitation due to R shoulder LOM, weakness or pain    Status  On-going      PT LONG TERM GOAL #5   Title  Pt will report ability to sleep on R side up to 4 hrs w/o limitation due to R shoulder pain    Status  On-going            Plan - 03/01/18 0936    Clinical Impression Statement  Shaindel reporting some benefit from DN performed last visit however notes greater pain reduction for longer duration following first DN two visits ago.  Pt. primary complaint today was constant dull/ache pain in lateral triceps/biceps area and with palpable TP's and tenderness in this area.  This area and R UT was addressed with STM/TPR with some relief noted following.  Duration of session focused on activation of musculature addressed with manual therapy and ended visit with Premod E-stim/moist heat to R shoulder and UE to promote relaxation of musculature and reduction in pain.  Pt. leaving session noting good pain relief.  Pt. reporting she is able to perform HEP without significant pain increase  now.  Will monitor response to today's session and progress toward goals per pt. response.      PT Treatment/Interventions  Patient/family education;Neuromuscular  re-education;Therapeutic exercise;Therapeutic activities;ADLs/Self Care Home Management;Electrical Stimulation;Moist Heat;Cryotherapy;Vasopneumatic Device;Ultrasound;Iontophoresis 4mg /ml Dexamethasone;Manual techniques;Dry needling;Taping    Consulted and Agree with Plan of Care  Patient       Patient will benefit from skilled therapeutic intervention in order to improve the following deficits and impairments:  Pain, Decreased range of motion, Decreased strength, Postural dysfunction, Impaired UE functional use, Increased muscle spasms  Visit Diagnosis: Chronic right shoulder pain  Stiffness of right shoulder, not elsewhere classified  Abnormal posture  Muscle weakness (generalized)     Problem List Patient Active Problem List   Diagnosis Date Noted  . Left hip pain 09/04/2017  . Right shoulder pain 09/04/2017  . Arthralgia of right temporomandibular joint 11/16/2016  . Sensorineural hearing loss (SNHL) of both ears 11/16/2016  . Tinnitus of left ear 11/16/2016  . Synovial cyst 08/07/2015  . Trochanteric bursitis of both hips 10/22/2014  . Plantar fasciitis, left 12/24/2013  . Left knee pain 10/15/2011    Bess Harvest, PTA 03/01/18 11:40 AM   [facility] [address]  [place], [place], [address] Phone: [phone]   Fax:  [phone]  Name: DA MICHELLE MRN: [id number] Date of Birth: [birth year]

## 2018-03-06 ENCOUNTER — Ambulatory Visit: Payer: 59

## 2018-03-08 ENCOUNTER — Ambulatory Visit: Payer: 59 | Admitting: Physical Therapy

## 2018-03-08 ENCOUNTER — Encounter: Payer: Self-pay | Admitting: Physical Therapy

## 2018-03-08 DIAGNOSIS — M25611 Stiffness of right shoulder, not elsewhere classified: Secondary | ICD-10-CM

## 2018-03-08 DIAGNOSIS — M25511 Pain in right shoulder: Secondary | ICD-10-CM | POA: Diagnosis not present

## 2018-03-08 DIAGNOSIS — R293 Abnormal posture: Secondary | ICD-10-CM | POA: Diagnosis not present

## 2018-03-08 DIAGNOSIS — M6281 Muscle weakness (generalized): Secondary | ICD-10-CM

## 2018-03-08 DIAGNOSIS — G8929 Other chronic pain: Secondary | ICD-10-CM | POA: Diagnosis not present

## 2018-03-08 NOTE — Therapy (Signed)
[facility] [address]  [place], [place], [address] Phone: [phone]   Fax:  [phone]  Physical Therapy Treatment  Patient Details  Name: Kendra Le MRN: [id number] Date of Birth: 02-10-1956 Referring Provider: Karlton Lemon, MD   Encounter Date: 03/08/2018  PT End of Session - 03/08/18 0933    Visit Number  9    Number of Visits  12    Date for PT Re-Evaluation  03/24/18    Authorization Type  [facility]    PT Start Time  0933    PT Stop Time  1035    PT Time Calculation (min)  62 min    Activity Tolerance  Patient tolerated treatment well    Behavior During Therapy  [facility] for tasks assessed/performed       Past Medical History:  Diagnosis Date  . GERD (gastroesophageal reflux disease)   . Hiatal hernia   . Migraine     Past Surgical History:  Procedure Laterality Date  . ABDOMINAL HYSTERECTOMY    . ANTERIOR CRUCIATE LIGAMENT REPAIR     left  . BREAST SURGERY    . CHOLECYSTECTOMY      There were no vitals filed for this visit.  Subjective Assessment - 03/08/18 0935    Subjective  Pt reporting "pain has been pretty bad for the last couple of days" - no known trigger.    Patient Stated Goals  "to be able to sleep on my R side at night & be able to comb my hair"    Currently in Pain?  Yes    Pain Score  8     Pain Location  Shoulder    Pain Orientation  Right;Upper;Lateral;Posterior    Pain Type  Chronic pain                       [facility] Adult PT Treatment/Exercise - 03/08/18 0933      Exercises   Exercises  Shoulder      Shoulder Exercises: ROM/Strengthening   UBE (Upper Arm Bike)  L1.0 x 4 min (2' fwd/back)      Modalities   Modalities  Electrical Stimulation;Moist Heat      Moist Heat Therapy   Number Minutes Moist Heat  15 Minutes    Moist Heat Location  Shoulder   & R upper arm     Electrical Stimulation   Electrical Stimulation Location  R shoulder complex +  biceps    Electrical Stimulation Action  IFC    Electrical Stimulation Parameters  80-150 Hz, intensity to pt tol x15'    Electrical Stimulation Goals  Pain;Tone      Manual Therapy   Manual Therapy  Soft tissue mobilization;Myofascial release;Scapular mobilization    Manual therapy comments  supine, prone    Soft tissue mobilization  STM to supra & infraspinatus, teres group, anterior & lateral deltoid, lateral pecs & biceps/triceps    Myofascial Release  manual TPR t/o R shoulder complex, esp infraspinatus, teres group, ant deltoid & biceps    Scapular Mobilization  Grade 2 all directions for pain relief       Trigger Point Dry Needling - 03/08/18 0933    Consent Given?  Yes    Muscles Treated Upper Body  Pectoralis major;Infraspinatus   + teres group, ant deltoid & biceps on R   Pectoralis Major Response  Twitch response elicited;Palpable increased muscle length    Infraspinatus Response  Twitch response elicited;Palpable increased muscle length             PT Short Term Goals - 02/21/18 1518      PT SHORT TERM GOAL #1   Title  Independent with initial HEP    Status  Achieved      PT SHORT TERM GOAL #2   Title  Pt will verbalize/demonstrate understanding of neutral shoulder posture to reduce risk fo impingement    Status  Achieved        PT Long Term Goals - 02/10/18 0858      PT LONG TERM GOAL #1   Title  Independent with ongoing/advanced HEP    Status  On-going      PT LONG TERM GOAL #2   Title  R shoulder AROM equivalent to L without increased pain     Status  On-going      PT LONG TERM GOAL #3   Title  R shoulder strength >/= 4+/5 w/o pain on MMT resistance     Status  On-going      PT LONG TERM GOAL #4   Title  Pt will report ability to comb hair w/o limitation due to R shoulder LOM, weakness or pain    Status  On-going      PT LONG TERM GOAL #5   Title  Pt will report ability to sleep on R side up to 4 hrs w/o limitation due to R shoulder pain     Status  On-going            Plan - 03/08/18 0937    Clinical Impression Statement  Kendra Le continues to report high level of R shoulder pain and limited functional use of R UE with only limited temporary relief from manual therapy including DN and limited tolerance for exercises.  Addressed ongoing increased muscle tension and TPs with manual therapy and DN throughout R shoulder complex with positive twitch response and palpable reduction in muscle tension along with decreased in reported pain. Treatment concluded with estim and moist heat to promote further pain reduction. Given limited lasting benefit from PT, discussed need to return to MD for further evaluation/consultation, with pt stating plan to schedule appt with MD.    Rehab Potential  Fair    PT Treatment/Interventions  Patient/family education;Neuromuscular re-education;Therapeutic exercise;Therapeutic activities;ADLs/Self Care Home Management;Electrical Stimulation;Moist Heat;Cryotherapy;Vasopneumatic Device;Ultrasound;Iontophoresis 4mg /ml Dexamethasone;Manual techniques;Dry needling;Taping    Consulted and Agree with Plan of Care  Patient       Patient will benefit from skilled therapeutic intervention in order to improve the following deficits and impairments:  Pain, Decreased range of motion, Decreased strength, Postural dysfunction, Impaired UE functional use, Increased muscle spasms  Visit Diagnosis: Chronic right shoulder pain  Stiffness of right shoulder, not elsewhere classified  Abnormal posture  Muscle weakness (generalized)     Problem List Patient Active Problem List   Diagnosis Date Noted  . Left hip pain 09/04/2017  . Right shoulder pain 09/04/2017  . Arthralgia of right temporomandibular joint 11/16/2016  . Sensorineural hearing loss (SNHL) of both ears 11/16/2016  . Tinnitus of left ear 11/16/2016  . Synovial cyst 08/07/2015  . Trochanteric bursitis of both hips 10/22/2014  . Plantar fasciitis, left  12/24/2013  . Left knee pain 10/15/2011    Percival Spanish, PT, MPT 03/08/2018, 3:21 PM  [facility] [address]  [address] [place], [place], [address] Phone: [phone]   Fax:  [phone]  Name:  Kendra Le MRN: [id number] Date of Birth: February 06, 1956

## 2018-03-13 ENCOUNTER — Ambulatory Visit (INDEPENDENT_AMBULATORY_CARE_PROVIDER_SITE_OTHER): Payer: 59 | Admitting: Family Medicine

## 2018-03-13 ENCOUNTER — Ambulatory Visit: Payer: Self-pay

## 2018-03-13 ENCOUNTER — Ambulatory Visit: Payer: 59

## 2018-03-13 ENCOUNTER — Encounter: Payer: Self-pay | Admitting: Family Medicine

## 2018-03-13 VITALS — BP 125/81 | HR 93 | Ht 62.0 in | Wt 175.0 lb

## 2018-03-13 DIAGNOSIS — R293 Abnormal posture: Secondary | ICD-10-CM

## 2018-03-13 DIAGNOSIS — M25511 Pain in right shoulder: Secondary | ICD-10-CM

## 2018-03-13 DIAGNOSIS — M25611 Stiffness of right shoulder, not elsewhere classified: Secondary | ICD-10-CM

## 2018-03-13 DIAGNOSIS — M6281 Muscle weakness (generalized): Secondary | ICD-10-CM | POA: Diagnosis not present

## 2018-03-13 DIAGNOSIS — G8929 Other chronic pain: Secondary | ICD-10-CM

## 2018-03-13 MED ORDER — NITROGLYCERIN 0.2 MG/HR TD PT24: MEDICATED_PATCH | TRANSDERMAL | 1 refills | Status: DC

## 2018-03-13 MED FILL — NITROGLYCERIN 0.2 MG/HR PAT: 0.2 | 88 days supply | Qty: 30 | Fill #0

## 2018-03-13 NOTE — Progress Notes (Signed)
PCP: Shirline Frees, MD  Subjective:   HPI: Patient is a 62 y.o. female here for right shoulder pain.  Patient reports continued 7 out of 10 sharp right shoulder pain.  She was initially seen on 09/01/2017 at which time she received a subacromial injection.  She reports about 6 months of relief following this but then the pain returned.  She then started physical therapy which she has been doing for the last month.  She denies any meaningful or lasting benefit from physical therapy.  Her pain is worse with daily use and when making movements overhead.  She reports having limited range of motion.  She denies any new injuries.  No swelling or erythema.  No numbness or tingling.  No associated skin changes.   Past Medical History:  Diagnosis Date  . GERD (gastroesophageal reflux disease)   . Hiatal hernia   . Migraine     Current Outpatient Medications on File Prior to Visit  Medication Sig Dispense Refill  . aspirin 81 MG tablet Take 81 mg by mouth daily.     . calcium-vitamin D (OSCAL WITH D) 500-200 MG-UNIT per tablet Take 1 tablet by mouth at bedtime.      Marland Kitchen esomeprazole (NEXIUM) 40 MG capsule Take 40 mg by mouth daily before breakfast.    . estradiol (VIVELLE-DOT) 0.025 MG/24HR   12  . fluorouracil (EFUDEX) 5 % cream APPLY TOPICALLY TO THE CHEST DAILY AT BEDTIME FOR 2 TO 4 WEEKS  1  . Multiple Vitamins-Minerals (MULTIVITAMINS THER. W/MINERALS) TABS Take 1 tablet by mouth 3 (three) times daily.      Salley Scarlet FORMULARY [facility]  Scar Cream -  Verapamil 10%, Pentoxifylline 5% / Diclofenac 3% Apply 1-2 grams to affected area 3-4 times daily Qty. 120 gm 3 refills    . pantoprazole (PROTONIX) 40 MG tablet Take 40 mg by mouth daily.     No current facility-administered medications on file prior to visit.     Past Surgical History:  Procedure Laterality Date  . ABDOMINAL HYSTERECTOMY    . ANTERIOR CRUCIATE LIGAMENT REPAIR     left  . BREAST SURGERY    . CHOLECYSTECTOMY       Allergies  Allergen Reactions  . Erythromycin   . Latex Other (See Comments)    Other  . Penicillins Nausea And Vomiting    Social History   Socioeconomic History  . Marital status: Married    Spouse name: Not on file  . Number of children: Not on file  . Years of education: Not on file  . Highest education level: Not on file  Occupational History  . Not on file  Social Needs  . Financial resource strain: Not on file  . Food insecurity:    Worry: Not on file    Inability: Not on file  . Transportation needs:    Medical: Not on file    Non-medical: Not on file  Tobacco Use  . Smoking status: Never Smoker  . Smokeless tobacco: Never Used  Substance and Sexual Activity  . Alcohol use: No    Alcohol/week: 0.0 standard drinks  . Drug use: No  . Sexual activity: Not on file  Lifestyle  . Physical activity:    Days per week: Not on file    Minutes per session: Not on file  . Stress: Not on file  Relationships  . Social connections:    Talks on phone: Not on file    Gets together: Not on  file    Attends religious service: Not on file    Active member of club or organization: Not on file    Attends meetings of clubs or organizations: Not on file    Relationship status: Not on file  . Intimate partner violence:    Fear of current or ex partner: Not on file    Emotionally abused: Not on file    Physically abused: Not on file    Forced sexual activity: Not on file  Other Topics Concern  . Not on file  Social History Narrative  . Not on file    Family History  Problem Relation Age of Onset  . Heart attack Mother   . Hyperlipidemia Mother   . Diabetes Mother   . Heart attack Father   . Hyperlipidemia Father   . Hypertension Father   . Sudden death Neg Hx     BP 125/81   Pulse 93   Ht 5\' 2"  (1.575 m)   Wt 175 lb (79.4 kg)   BMI 32.01 kg/m   Review of Systems: See HPI above.     Objective:  Physical Exam:  Gen: awake, alert, NAD, comfortable in  exam room Pulm: breathing unlabored  Right shoulder: No obvious deformity or asymmetry. No bruising. No swelling Mild diffuse tenderness over lateral shoulder.  No tenderness over the [facility] joint Full symmetric range of motion in abduction and flexion.  Perhaps slightly less range of motion in external rotation.  Normal internal rotation. NV intact distally Special Tests:  - Impingement: Pain with Hawkins and Neers.  - Supraspinatous: Pain with empty can. 4+/5 strength with pain - Infraspinatous/Teres: Negative external rotation lag. 4+/5 strength with pain - Subscapularis: negative belly press, negative bear hug. Strength normal/symmetric.  Mild pain - Biceps tendon: Negative Speeds.  - Labrum: Negative Obriens.  [facility] Joint: Negative cross arm - Negative apprehension test  Ultrasound of Shoulder-Right  BT short: normal appearance BT long: normal appearance Supraspinatus tendon: cortical irregularity, small 3.64mm intrasubstance tear. Fluid visualized within the bursa Subscapularis tendon: normal appearance Infraspinatus tendon: normal appearance AC joint: arthritic changes seen in the [facility] joint  Summary and Additional findings- small 3.66mm intrasubstance tear of the supraspinatus tendon. Subacromial bursitis. AC joint arthrtis  Left shoulder: No obvious deformity or asymmetry. No bruising. No swelling No TTP. Specifically no TTP over [facility] joint Full ROM in flexion, abduction, internal/external rotation NV intact distally 5/5 strength with empty can, IR/ER without pain   Assessment & Plan:  1. Right shoulder pain - secondary to impingement and small supraspinatus interstitial tear. - pt will put formal physical therapy on hold at this time since she has been getting no significant relief. She will continue HEP - NG patches - continue tylenol or NSAIDs - f/u in 6 weeks

## 2018-03-13 NOTE — Therapy (Addendum)
[facility] [address]  [place], [place], [address] Phone: [phone]   Fax:  [phone]  Physical Therapy Treatment / Discharge Summary  Patient Details  Name: Kendra Le MRN: [id number] Date of Birth: Jun 09, 1956 Referring Provider: Karlton Lemon, MD   Encounter Date: 03/13/2018  PT End of Session - 03/13/18 1027    Visit Number  10    Number of Visits  12    Date for PT Re-Evaluation  03/24/18    Authorization Type  [facility]    PT Start Time  1021    PT Stop Time  1101    PT Time Calculation (min)  40 min    Activity Tolerance  Patient tolerated treatment well    Behavior During Therapy  [facility] for tasks assessed/performed       Past Medical History:  Diagnosis Date  . GERD (gastroesophageal reflux disease)   . Hiatal hernia   . Migraine     Past Surgical History:  Procedure Laterality Date  . ABDOMINAL HYSTERECTOMY    . ANTERIOR CRUCIATE LIGAMENT REPAIR     left  . BREAST SURGERY    . CHOLECYSTECTOMY      There were no vitals filed for this visit.  Subjective Assessment - 03/13/18 1025    Subjective  Pt. reporting MD says she has a tear in Supraspinatus muscle after imaging.  Wishes to go on hold from therapy.      Patient Stated Goals  "to be able to sleep on my R side at night & be able to comb my hair"    Currently in Pain?  Yes    Pain Score  6     Pain Location  Shoulder    Pain Orientation  Right;Upper    Pain Descriptors / Indicators  Aching;Constant    Pain Type  Chronic pain    Pain Onset  More than a month ago    Pain Frequency  Constant    Aggravating Factors   Any movement     Pain Relieving Factors  flexeril    Multiple Pain Sites  No         [facility] PT Assessment - 03/13/18 1048      Observation/Other Assessments   Focus on Therapeutic Outcomes (FOTO)   47% (53% limitation)      AROM   Right/Left Shoulder  Right;Left    Right Shoulder Flexion  145 Degrees    Right  Shoulder ABduction  135 Degrees    Right Shoulder Internal Rotation  --   FIR to T 12   Right Shoulder External Rotation  --   FER to T2   Left Shoulder Flexion  160 Degrees    Left Shoulder ABduction  172 Degrees    Left Shoulder Internal Rotation  --   FIR to bra strap   Left Shoulder External Rotation  --   FER to T2     PROM   PROM Assessment Site  Shoulder    Right/Left Shoulder  Right    Right Shoulder Flexion  152 Degrees    Right Shoulder ABduction  114 Degrees    Right Shoulder Internal Rotation  70 Degrees    Right Shoulder External Rotation  45 Degrees      Strength   Right/Left Shoulder  Right;Left    Right Shoulder Flexion  4-/5    Right Shoulder ABduction  4-/5    Right Shoulder Internal Rotation  4-/5  Right Shoulder External Rotation  3+/5    Left Shoulder Flexion  4+/5    Left Shoulder ABduction  4+/5    Left Shoulder Internal Rotation  4+/5    Left Shoulder External Rotation  4/5                   [facility] Adult PT Treatment/Exercise - 03/13/18 1127      Shoulder Exercises: Supine   External Rotation  AAROM;Right;10 reps    External Rotation Limitations  wand     Flexion  AAROM;Right;10 reps    Flexion Limitations  wand       Shoulder Exercises: Seated   Flexion  Right;AAROM;10 reps    Flexion Limitations  seated red p-ball rollouts for ROM    Abduction  Right;AAROM;10 reps    ABduction Limitations  wand     Other Seated Exercises  Seated flexion, scaption table slides at counter seated in chair x 5 rpes each way       Shoulder Exercises: Pulleys   Flexion  3 minutes    Scaption  3 minutes      Shoulder Exercises: ROM/Strengthening   Wall Wash  R shoulder flexion wall wash x 10 resp with wash cloth       Shoulder Exercises: Stretch   Internal Rotation Stretch  10 seconds   x 5 reps; towel stretch behind back     Modalities   Modalities  Vasopneumatic      Vasopneumatic   Number Minutes Vasopneumatic   15 minutes     Vasopnuematic Location   Shoulder   R   Vasopneumatic Pressure  Medium    Vasopneumatic Temperature   coldest temp.             PT Education - 03/13/18 1219    Education Details  HEp update focused on ROM     Person(s) Educated  Patient    Methods  Explanation;Demonstration;Verbal cues;Handout    Comprehension  Verbalized understanding;Returned demonstration;Verbal cues required;Need further instruction       PT Short Term Goals - 02/21/18 1518      PT SHORT TERM GOAL #1   Title  Independent with initial HEP    Status  Achieved      PT SHORT TERM GOAL #2   Title  Pt will verbalize/demonstrate understanding of neutral shoulder posture to reduce risk fo impingement    Status  Achieved        PT Long Term Goals - 03/13/18 1043      PT LONG TERM GOAL #1   Title  Independent with ongoing/advanced HEP    Status  Partially Met      PT LONG TERM GOAL #2   Title  R shoulder AROM equivalent to L without increased pain     Status  On-going      PT LONG TERM GOAL #3   Title  R shoulder strength >/= 4+/5 w/o pain on MMT resistance     Status  On-going      PT LONG TERM GOAL #4   Title  Pt will report ability to comb hair w/o limitation due to R shoulder LOM, weakness or pain    Status  On-going      PT LONG TERM GOAL #5   Title  Pt will report ability to sleep on R side up to 4 hrs w/o limitation due to R shoulder pain    Status  On-going  Plan - 03/13/18 1028    Clinical Impression Statement  Pt. seen to start session today noting she wishes to go on 30-day hold from therapy as recent imaging from MD showed small tear in Supraspinatus muscle.  Supervising PT approving plan and pt. HEP updated to include pendulums and other ROM activities issued to pt. via handout.  Pt. noting she cannot sleep on R side in bed at all without shoulder pain waking her up.  Notes she is unable to comb hair without pain or limitation at this time.  Pt. R shoulder strength with  MMT remains 3-/5 - 4-/5 grossly at this time with pain on resistance in all directions.  Majority of LTG's ongoing at this point as pt. R shoulder pain limiting ROM and most activities in therapy.  Pt. instructed to focus on ROM activities issued to her in today's session and slowly transition to light, yellow TB resisted activities as able a few weeks from now.  Pt. now on 30-day hold.       PT Treatment/Interventions  Patient/family education;Neuromuscular re-education;Therapeutic exercise;Therapeutic activities;ADLs/Self Care Home Management;Electrical Stimulation;Moist Heat;Cryotherapy;Vasopneumatic Device;Ultrasound;Iontophoresis 52m/ml Dexamethasone;Manual techniques;Dry needling;Taping    PT Next Visit Plan  30-day hold    Consulted and Agree with Plan of Care  Patient       Patient will benefit from skilled therapeutic intervention in order to improve the following deficits and impairments:  Pain, Decreased range of motion, Decreased strength, Postural dysfunction, Impaired UE functional use, Increased muscle spasms  Visit Diagnosis: Chronic right shoulder pain  Stiffness of right shoulder, not elsewhere classified  Abnormal posture  Muscle weakness (generalized)     Problem List Patient Active Problem List   Diagnosis Date Noted  . Left hip pain 09/04/2017  . Right shoulder pain 09/04/2017  . Arthralgia of right temporomandibular joint 11/16/2016  . Sensorineural hearing loss (SNHL) of both ears 11/16/2016  . Tinnitus of left ear 11/16/2016  . Synovial cyst 08/07/2015  . Trochanteric bursitis of both hips 10/22/2014  . Plantar fasciitis, left 12/24/2013  . Left knee pain 10/15/2011    MBess Harvest PTA 03/13/18 12:38 PM   [facility] [address] [place] [address]Phone: [phone]  Fax:  [phone] Name: CVERNADETTE STUTSMANMRN: [id number] of Birth: 1January 29, 1957 PHYSICAL THERAPY  DISCHARGE SUMMARY  Visits from Start of Care: 10  Current functional level related to goals / functional outcomes:   Refer to above clinical impression for status as of last visit on 03/13/18. Pt was placed on hold for 30 days and has not needed to return to PT, therefore will proceed with discharge from PT for this episode.   Remaining deficits:   As above.    Education / Equipment:   HEP  Plan: Patient agrees to discharge.  Patient goals were partially met. Patient is being discharged due to the patient's request.  ?????     JPercival Spanish PT, MPT 05/23/18, 8:25 AM  [facility] [address]Phone: [phone]  Fax:  [phone]

## 2018-03-13 NOTE — Patient Instructions (Signed)
You have rotator cuff impingement and a small interstitial supraspinatus tear. Try to avoid painful activities (overhead activities, lifting with extended arm) as much as possible. Aleve 2 tabs twice a day with food OR ibuprofen 3 tabs three times a day with food for pain and inflammation only as needed. Nitro patches 1/4th patch to affected shoulder, change daily Can take tylenol in addition to this. Subacromial injection may be beneficial to help with pain and to decrease inflammation. Hold physical therapy and focus on the motion exercises (arm circles, swings, table slides). Do home exercise program with theraband and scapular stabilization exercises daily 3 sets of 10 once a day only as you progress from the motion exercises If not improving at follow-up we will consider repeat injection, increased nitro dose. Follow up with me in 6 weeks.

## 2018-04-20 MED FILL — CYCLOBENZAPRINE HCL 10 MG T: 10 | 10 days supply | Qty: 30 | Fill #0

## 2018-05-01 ENCOUNTER — Ambulatory Visit: Payer: 59 | Admitting: Family Medicine

## 2018-05-01 MED FILL — ESTRADIOL 0.025 MG PATCH: 0.025 | 84 days supply | Qty: 24 | Fill #3

## 2018-05-01 MED FILL — PANTOPRAZOLE SOD DR 40 MG T: 40 | 90 days supply | Qty: 90 | Fill #3

## 2018-05-02 ENCOUNTER — Ambulatory Visit (INDEPENDENT_AMBULATORY_CARE_PROVIDER_SITE_OTHER): Payer: 59 | Admitting: Family Medicine

## 2018-05-02 ENCOUNTER — Encounter: Payer: Self-pay | Admitting: Family Medicine

## 2018-05-02 DIAGNOSIS — M25511 Pain in right shoulder: Secondary | ICD-10-CM | POA: Diagnosis not present

## 2018-05-02 MED ORDER — MELOXICAM 15 MG PO TABS: 15.0000 mg | ORAL_TABLET | Freq: Every day | ORAL | 2 refills | Status: DC

## 2018-05-02 MED ORDER — METHYLPREDNISOLONE ACETATE 40 MG/ML IJ SUSP
40.0000 mg | Freq: Once | INTRAMUSCULAR | Status: AC
  Administered 2018-05-02: 40 mg via INTRA_ARTICULAR

## 2018-05-02 MED FILL — MELOXICAM 15 MG TABLET: 15 | 30 days supply | Qty: 30 | Fill #0

## 2018-05-02 NOTE — Progress Notes (Signed)
PCP: Shirline Frees, MD  Subjective:   HPI: Patient is a 62 y.o. female here for right shoulder pain.  9/23: Patient reports continued 7 out of 10 sharp right shoulder pain.  She was initially seen on 09/01/2017 at which time she received a subacromial injection.  She reports about 6 months of relief following this but then the pain returned.  She then started physical therapy which she has been doing for the last month.  She denies any meaningful or lasting benefit from physical therapy.  Her pain is worse with daily use and when making movements overhead.  She reports having limited range of motion.  She denies any new injuries.  No swelling or erythema.  No numbness or tingling.  No associated skin changes.  11/12: Patient reports she feels about the same though reports pain level is less. Pain level 3/10 and has been doing home exercises, using 1/4th nitro patch daily. Motion worsening of shoulder. No skin changes, numbness. No new injuries.  Past Medical History:  Diagnosis Date  . GERD (gastroesophageal reflux disease)   . Hiatal hernia   . Migraine     Current Outpatient Medications on File Prior to Visit  Medication Sig Dispense Refill  . aspirin 81 MG tablet Take 81 mg by mouth daily.     . calcium-vitamin D (OSCAL WITH D) 500-200 MG-UNIT per tablet Take 1 tablet by mouth at bedtime.      . cyclobenzaprine (FLEXERIL) 10 MG tablet   1  . esomeprazole (NEXIUM) 40 MG capsule Take 40 mg by mouth daily before breakfast.    . estradiol (VIVELLE-DOT) 0.025 MG/24HR   12  . fluorouracil (EFUDEX) 5 % cream APPLY TOPICALLY TO THE CHEST DAILY AT BEDTIME FOR 2 TO 4 WEEKS  1  . Multiple Vitamins-Minerals (MULTIVITAMINS THER. W/MINERALS) TABS Take 1 tablet by mouth 3 (three) times daily.      . nitroGLYCERIN (NITRODUR - DOSED IN MG/24 HR) 0.2 mg/hr patch Apply 1/4th patch to affected shoulder, change daily 30 patch 1  . NON FORMULARY [facility]  Scar Cream -  Verapamil 10%,  Pentoxifylline 5% / Diclofenac 3% Apply 1-2 grams to affected area 3-4 times daily Qty. 120 gm 3 refills    . pantoprazole (PROTONIX) 40 MG tablet Take 40 mg by mouth daily.     No current facility-administered medications on file prior to visit.     Past Surgical History:  Procedure Laterality Date  . ABDOMINAL HYSTERECTOMY    . ANTERIOR CRUCIATE LIGAMENT REPAIR     left  . BREAST SURGERY    . CHOLECYSTECTOMY      Allergies  Allergen Reactions  . Erythromycin   . Latex Other (See Comments)    Other  . Penicillins Nausea And Vomiting    Social History   Socioeconomic History  . Marital status: Married    Spouse name: Not on file  . Number of children: Not on file  . Years of education: Not on file  . Highest education level: Not on file  Occupational History  . Not on file  Social Needs  . Financial resource strain: Not on file  . Food insecurity:    Worry: Not on file    Inability: Not on file  . Transportation needs:    Medical: Not on file    Non-medical: Not on file  Tobacco Use  . Smoking status: Never Smoker  . Smokeless tobacco: Never Used  Substance and Sexual Activity  . Alcohol  use: No    Alcohol/week: 0.0 standard drinks  . Drug use: No  . Sexual activity: Not on file  Lifestyle  . Physical activity:    Days per week: Not on file    Minutes per session: Not on file  . Stress: Not on file  Relationships  . Social connections:    Talks on phone: Not on file    Gets together: Not on file    Attends religious service: Not on file    Active member of club or organization: Not on file    Attends meetings of clubs or organizations: Not on file    Relationship status: Not on file  . Intimate partner violence:    Fear of current or ex partner: Not on file    Emotionally abused: Not on file    Physically abused: Not on file    Forced sexual activity: Not on file  Other Topics Concern  . Not on file  Social History Narrative  . Not on file     Family History  Problem Relation Age of Onset  . Heart attack Mother   . Hyperlipidemia Mother   . Diabetes Mother   . Heart attack Father   . Hyperlipidemia Father   . Hypertension Father   . Sudden death Neg Hx     BP 120/79   Pulse 85   Ht 5\' 2"  (1.575 m)   Wt 175 lb (79.4 kg)   BMI 32.01 kg/m   Review of Systems: See HPI above.     Objective:  Physical Exam:  Gen: NAD, comfortable in exam room  Right shoulder: No swelling, ecchymoses.  No gross deformity. TTP laterally but no AC, biceps tendon tenderness. ER limited to 30 degrees passively.  Flexion and abduction to 150 degrees, painful. Positive Hawkins, Neers. Negative Yergasons. Strength 5/5 with empty can and resisted internal/external rotation.  Pain empty can and ER. Negative apprehension. NV intact distally.   Assessment & Plan:  1. Right shoulder pain - now with adhesive capsulitis though also with impingement, small interstitial supraspinatus tear.  Discussed options.  She will take meloxicam; intraarticular injection given today.  Continue motion exercises.  Heat.  F/u in 6 weeks.  Continue nitro patches.  After informed written consent timeout was performed, patient was seated on exam table. Right shoulder was prepped with alcohol swab and utilizing posterior approach with ultrasound guidance, patient's right glenohumeral space was injected with 3:1 bupivicaine: depomedrol. Patient tolerated the procedure well without immediate complications.

## 2018-05-02 NOTE — Patient Instructions (Signed)
You have a frozen shoulder (adhesive capsulitis), a buildup of scar tissue that limits motion of the shoulder joint in addition to your impingement, small supraspinatus tear. Limit lifting and overhead activities as much as possible. Heat 15 minutes at a time 3-4 times a day may help with movement and stiffness. Meloxicam 15mg  daily with food (ok to split in half if this dose bothers your stomach). Steroid injections in a series have been shown to help with pain and motion - let me know if you want to do this. Codman exercises (pendulum, wall walking or table slides, arm circles) - do 3 sets of 10 once or twice a day. Physical therapy for rotator cuff strengthening is a consideration once you are out of the painful phase Follow up in 6 weeks otherwise.

## 2018-06-06 ENCOUNTER — Ambulatory Visit: Payer: 59 | Admitting: Family Medicine

## 2018-06-08 ENCOUNTER — Ambulatory Visit (INDEPENDENT_AMBULATORY_CARE_PROVIDER_SITE_OTHER): Payer: 59 | Admitting: Family Medicine

## 2018-06-08 ENCOUNTER — Encounter: Payer: Self-pay | Admitting: Family Medicine

## 2018-06-08 VITALS — BP 118/74 | HR 68 | Ht 62.0 in | Wt 175.0 lb

## 2018-06-08 DIAGNOSIS — M25511 Pain in right shoulder: Secondary | ICD-10-CM | POA: Diagnosis not present

## 2018-06-08 NOTE — Progress Notes (Signed)
PCP: Shirline Frees, MD  Subjective:   HPI: Patient is a 62 y.o. female here for right shoulder pain.  9/23: Patient reports continued 7 out of 10 sharp right shoulder pain.  She was initially seen on 09/01/2017 at which time she received a subacromial injection.  She reports about 6 months of relief following this but then the pain returned.  She then started physical therapy which she has been doing for the last month.  She denies any meaningful or lasting benefit from physical therapy.  Her pain is worse with daily use and when making movements overhead.  She reports having limited range of motion.  She denies any new injuries.  No swelling or erythema.  No numbness or tingling.  No associated skin changes.  11/12: Patient reports she feels about the same though reports pain level is less. Pain level 3/10 and has been doing home exercises, using 1/4th nitro patch daily. Motion worsening of shoulder. No skin changes, numbness. No new injuries.  12/19: Patient reports she's doing much better. Shot helped a lot with motion and pain. Bothers when reaching forward (specifically when trying to plug something in under the couch). Out of mobic now. Pain level 0/10. No skin changes.  Past Medical History:  Diagnosis Date  . GERD (gastroesophageal reflux disease)   . Hiatal hernia   . Migraine     Current Outpatient Medications on File Prior to Visit  Medication Sig Dispense Refill  . aspirin 81 MG tablet Take 81 mg by mouth daily.     . calcium-vitamin D (OSCAL WITH D) 500-200 MG-UNIT per tablet Take 1 tablet by mouth at bedtime.      . cyclobenzaprine (FLEXERIL) 10 MG tablet   1  . esomeprazole (NEXIUM) 40 MG capsule Take 40 mg by mouth daily before breakfast.    . estradiol (VIVELLE-DOT) 0.025 MG/24HR   12  . fluorouracil (EFUDEX) 5 % cream APPLY TOPICALLY TO THE CHEST DAILY AT BEDTIME FOR 2 TO 4 WEEKS  1  . meloxicam (MOBIC) 15 MG tablet Take 1 tablet (15 mg total) by mouth  daily. 30 tablet 2  . Multiple Vitamins-Minerals (MULTIVITAMINS THER. W/MINERALS) TABS Take 1 tablet by mouth 3 (three) times daily.      . nitroGLYCERIN (NITRODUR - DOSED IN MG/24 HR) 0.2 mg/hr patch Apply 1/4th patch to affected shoulder, change daily 30 patch 1  . NON FORMULARY [facility]  Scar Cream -  Verapamil 10%, Pentoxifylline 5% / Diclofenac 3% Apply 1-2 grams to affected area 3-4 times daily Qty. 120 gm 3 refills    . pantoprazole (PROTONIX) 40 MG tablet Take 40 mg by mouth daily.     No current facility-administered medications on file prior to visit.     Past Surgical History:  Procedure Laterality Date  . ABDOMINAL HYSTERECTOMY    . ANTERIOR CRUCIATE LIGAMENT REPAIR     left  . BREAST SURGERY    . CHOLECYSTECTOMY      Allergies  Allergen Reactions  . Erythromycin   . Latex Other (See Comments)    Other  . Penicillins Nausea And Vomiting    Social History   Socioeconomic History  . Marital status: Married    Spouse name: Not on file  . Number of children: Not on file  . Years of education: Not on file  . Highest education level: Not on file  Occupational History  . Not on file  Social Needs  . Financial resource strain: Not on file  .  Food insecurity:    Worry: Not on file    Inability: Not on file  . Transportation needs:    Medical: Not on file    Non-medical: Not on file  Tobacco Use  . Smoking status: Never Smoker  . Smokeless tobacco: Never Used  Substance and Sexual Activity  . Alcohol use: No    Alcohol/week: 0.0 standard drinks  . Drug use: No  . Sexual activity: Not on file  Lifestyle  . Physical activity:    Days per week: Not on file    Minutes per session: Not on file  . Stress: Not on file  Relationships  . Social connections:    Talks on phone: Not on file    Gets together: Not on file    Attends religious service: Not on file    Active member of club or organization: Not on file    Attends meetings of clubs or  organizations: Not on file    Relationship status: Not on file  . Intimate partner violence:    Fear of current or ex partner: Not on file    Emotionally abused: Not on file    Physically abused: Not on file    Forced sexual activity: Not on file  Other Topics Concern  . Not on file  Social History Narrative  . Not on file    Family History  Problem Relation Age of Onset  . Heart attack Mother   . Hyperlipidemia Mother   . Diabetes Mother   . Heart attack Father   . Hyperlipidemia Father   . Hypertension Father   . Sudden death Neg Hx     BP 118/74   Pulse 68   Ht 5\' 2"  (1.575 m)   Wt 175 lb (79.4 kg)   BMI 32.01 kg/m   Review of Systems: See HPI above.     Objective:  Physical Exam:  Gen: NAD, comfortable in exam room  Right shoulder: No swelling, ecchymoses.  No gross deformity. No TTP. ER to 60 degrees compared to 70 on left.  Full abduction and flexion. Negative Hawkins, Neers. Negative Yergasons. Strength 5/5 with empty can and resisted internal/external rotation.  Mild pain empty can. Negative apprehension. NV intact distally.   Assessment & Plan:  1. Right shoulder pain - much improved following combination injection.  Adhesive capsulitis and impingement almost resolved.  Continue motion exercises.  Heat.  Tylenol, aleve only if needed.  F/u prn - consider physical therapy if pain worsens.

## 2018-07-20 DIAGNOSIS — Z01419 Encounter for gynecological examination (general) (routine) without abnormal findings: Secondary | ICD-10-CM | POA: Diagnosis not present

## 2018-07-20 DIAGNOSIS — Z6831 Body mass index (BMI) 31.0-31.9, adult: Secondary | ICD-10-CM | POA: Diagnosis not present

## 2018-07-20 DIAGNOSIS — Z1231 Encounter for screening mammogram for malignant neoplasm of breast: Secondary | ICD-10-CM | POA: Diagnosis not present

## 2018-07-20 MED FILL — ESTRADIOL 0.025 MG/24HR PTT: 0.025 | 28 days supply | Qty: 8 | Fill #0

## 2018-08-15 DIAGNOSIS — K219 Gastro-esophageal reflux disease without esophagitis: Secondary | ICD-10-CM | POA: Diagnosis not present

## 2018-08-15 DIAGNOSIS — E669 Obesity, unspecified: Secondary | ICD-10-CM | POA: Diagnosis not present

## 2018-08-16 MED FILL — PANTOPRAZOLE SOD DR 40 MG T: 40 | 90 days supply | Qty: 90 | Fill #0

## 2018-08-16 MED FILL — ESTRADIOL 0.025 MG/24HR PTT: 0.025 | 84 days supply | Qty: 24 | Fill #0 | Status: TO

## 2018-08-25 MED FILL — MELOXICAM 15 MG TABLET: 15 | 30 days supply | Qty: 30 | Fill #1

## 2018-10-31 MED FILL — CYCLOBENZAPRINE HCL 10 MG T: 10 | 10 days supply | Qty: 30 | Fill #1

## 2018-10-31 MED FILL — ESTRADIOL 0.025 MG/24HR PTT: 0.025 | 84 days supply | Qty: 24 | Fill #0

## 2018-11-16 MED FILL — PANTOPRAZOLE SOD DR 40 MG T: 40 | 90 days supply | Qty: 90 | Fill #1

## 2019-01-31 MED FILL — ESTRADIOL 0.025 MG/24HR PTT: 0.025 | 84 days supply | Qty: 24 | Fill #1

## 2019-02-20 MED FILL — PANTOPRAZOLE SOD DR 40 MG T: 40 | 90 days supply | Qty: 90 | Fill #2

## 2019-03-25 DIAGNOSIS — T1502XA Foreign body in cornea, left eye, initial encounter: Secondary | ICD-10-CM | POA: Diagnosis not present

## 2019-04-26 MED FILL — ESTRADIOL 0.025 MG/24HR PTT: 0.025 | 84 days supply | Qty: 24 | Fill #2

## 2019-05-15 MED FILL — PANTOPRAZOLE SOD DR 40 MG T: 40 | 90 days supply | Qty: 90 | Fill #3

## 2019-07-16 MED FILL — ESTRADIOL 0.025 MG/24HR PTT: 0.025 | 84 days supply | Qty: 24 | Fill #0

## 2019-08-14 DIAGNOSIS — Z20828 Contact with and (suspected) exposure to other viral communicable diseases: Secondary | ICD-10-CM | POA: Diagnosis not present

## 2019-08-14 DIAGNOSIS — K219 Gastro-esophageal reflux disease without esophagitis: Secondary | ICD-10-CM | POA: Diagnosis not present

## 2019-08-14 MED FILL — PANTOPRAZOLE SOD DR 40 MG T: 40 | 90 days supply | Qty: 90 | Fill #0

## 2019-08-30 DIAGNOSIS — L820 Inflamed seborrheic keratosis: Secondary | ICD-10-CM | POA: Diagnosis not present

## 2019-08-30 DIAGNOSIS — D229 Melanocytic nevi, unspecified: Secondary | ICD-10-CM | POA: Diagnosis not present

## 2019-08-30 DIAGNOSIS — L570 Actinic keratosis: Secondary | ICD-10-CM | POA: Diagnosis not present

## 2019-09-04 DIAGNOSIS — Z01419 Encounter for gynecological examination (general) (routine) without abnormal findings: Secondary | ICD-10-CM | POA: Diagnosis not present

## 2019-09-04 DIAGNOSIS — Z1231 Encounter for screening mammogram for malignant neoplasm of breast: Secondary | ICD-10-CM | POA: Diagnosis not present

## 2019-09-04 DIAGNOSIS — Z6827 Body mass index (BMI) 27.0-27.9, adult: Secondary | ICD-10-CM | POA: Diagnosis not present

## 2019-09-04 DIAGNOSIS — Z13820 Encounter for screening for osteoporosis: Secondary | ICD-10-CM | POA: Diagnosis not present

## 2019-09-13 DIAGNOSIS — H524 Presbyopia: Secondary | ICD-10-CM | POA: Diagnosis not present

## 2019-10-08 MED FILL — ESTRADIOL 0.025 MG/24HR PTT: 0.025 | 84 days supply | Qty: 24 | Fill #1

## 2019-11-09 MED FILL — PANTOPRAZOLE SOD DR 40 MG T: 40 | 90 days supply | Qty: 90 | Fill #1

## 2019-12-13 ENCOUNTER — Ambulatory Visit (INDEPENDENT_AMBULATORY_CARE_PROVIDER_SITE_OTHER): Payer: 59 | Admitting: Podiatry

## 2019-12-13 ENCOUNTER — Other Ambulatory Visit: Payer: Self-pay

## 2019-12-13 DIAGNOSIS — L600 Ingrowing nail: Secondary | ICD-10-CM

## 2019-12-13 DIAGNOSIS — M79675 Pain in left toe(s): Secondary | ICD-10-CM

## 2019-12-13 MED ORDER — SULFAMETHOXAZOLE-TRIMETHOPRIM 800-160 MG PO TABS: 1.0000 | ORAL_TABLET | Freq: Two times a day (BID) | ORAL | 0 refills | Status: DC

## 2019-12-13 MED FILL — SULFAMETHOXAZOLE-TMP DS TAB: 800-160 | 7 days supply | Qty: 14 | Fill #0

## 2019-12-13 NOTE — Patient Instructions (Signed)

## 2019-12-17 NOTE — Progress Notes (Signed)
Subjective: 64 year old female presents the office today for concerns of ingrown toenail left big toe, medial aspect there is no ongoing for last 1 month.  The area is tender with pressure in shoes.  Denies any drainage or pus coming from the area. Denies any systemic complaints such as fevers, chills, nausea, vomiting. No acute changes since last appointment, and no other complaints at this time.   Objective: AAO x3, NAD DP/PT pulses palpable bilaterally, CRT less than 3 seconds Incurvation present to medial aspect left hallux toenail with localized edema and erythema but there is no drainage or pus or ascending cellulitis.  No fluctuation or crepitation.  No open lesions. No pain with calf compression, swelling, warmth, erythema  Assessment: Left medial hallux ingrown toenail  Plan: -All treatment options discussed with the patient including all alternatives, risks, complications.  -At this time, the patient is requesting partial nail removal with chemical matricectomy to the symptomatic portion of the nail. Risks and complications were discussed with the patient for which they understand and written consent was obtained. Under sterile conditions a total of 3 mL of a mixture of 2% lidocaine plain and 0.5% Marcaine plain was infiltrated in a hallux block fashion. Once anesthetized, the skin was prepped in sterile fashion. A tourniquet was then applied. Next the medial aspect of hallux nail border was then sharply excised making sure to remove the entire offending nail border. Once the nails were ensured to be removed area was debrided and the underlying skin was intact. There is no purulence identified in the procedure. Next phenol was then applied under standard conditions and copiously irrigated. Silvadene was applied. A dry sterile dressing was applied. After application of the dressing the tourniquet was removed and there is found to be an immediate capillary refill time to the digit. The patient  tolerated the procedure well any complications. Post procedure instructions were discussed the patient for which he verbally understood. Follow-up in one week for nail check or sooner if any problems are to arise. Discussed signs/symptoms of infection and directed to call the office immediately should any occur or go directly to the emergency room. In the meantime, encouraged to call the office with any questions, concerns, changes symptoms. -Bactrim  -Patient encouraged to call the office with any questions, concerns, change in symptoms.

## 2019-12-20 ENCOUNTER — Other Ambulatory Visit: Payer: Self-pay

## 2019-12-20 ENCOUNTER — Encounter: Payer: Self-pay | Admitting: Podiatry

## 2019-12-20 ENCOUNTER — Ambulatory Visit (INDEPENDENT_AMBULATORY_CARE_PROVIDER_SITE_OTHER): Payer: 59 | Admitting: Podiatry

## 2019-12-20 DIAGNOSIS — L600 Ingrowing nail: Secondary | ICD-10-CM

## 2019-12-20 NOTE — Patient Instructions (Signed)

## 2019-12-21 DIAGNOSIS — L600 Ingrowing nail: Secondary | ICD-10-CM | POA: Insufficient documentation

## 2019-12-21 NOTE — Progress Notes (Signed)
Subjective: Kendra Le is a 64 y.o.  Female returns to office today for follow up evaluation after having left Hallux partial nail avulsion performed. Patient has been soaking using epsom salts and applying topical antibiotic covered with bandaid daily.  She has been doing well without any significant pain and discomfort is improving.  Denies any drainage or pus.  Patient denies fevers, chills, nausea, vomiting. Denies any calf pain, chest pain, SOB.   Objective:  Vitals: Reviewed  General: Well developed, nourished, in no acute distress, alert and oriented x3   Dermatology: Skin is warm, dry and supple bilateral. LEFT hallux nail border appears to be clean, dry, with mild granular tissue and surrounding scab. There is no surrounding erythema, edema, drainage/purulence. The remaining nails appear unremarkable at this time. There are no other lesions or other signs of infection present.  Neurovascular status: Intact. No lower extremity swelling; No pain with calf compression bilateral.  Musculoskeletal: Decreased tenderness to palpation of the medial hallux nail fold. Muscular strength within normal limits bilateral.   Assesement and Plan: S/p partial nail avulsion, doing well.   -Continue soaking in epsom salts twice a day followed by antibiotic ointment and a band-aid. Can leave uncovered at night. Continue this until completely healed.  -If the area has not healed in 2 weeks, call the office for follow-up appointment, or sooner if any problems arise.  -Monitor for any signs/symptoms of infection. Call the office immediately if any occur or go directly to the emergency room. Call with any questions/concerns.  Celesta Gentile, DPM

## 2020-01-04 MED FILL — ESTRADIOL 0.025 MG/24HR PTT: 0.025 | 84 days supply | Qty: 24 | Fill #2

## 2020-02-20 MED FILL — PANTOPRAZOLE SOD DR 40 MG T: 40 | 90 days supply | Qty: 90 | Fill #2

## 2020-03-21 MED FILL — ESTRADIOL 0.025 MG/24HR PTT: 0.025 | 84 days supply | Qty: 24 | Fill #0

## 2020-05-22 MED FILL — PANTOPRAZOLE SOD DR 40 MG T: 40 | 90 days supply | Qty: 90 | Fill #3

## 2020-06-03 ENCOUNTER — Other Ambulatory Visit (HOSPITAL_BASED_OUTPATIENT_CLINIC_OR_DEPARTMENT_OTHER): Payer: Self-pay | Admitting: Physician Assistant

## 2020-06-03 DIAGNOSIS — L309 Dermatitis, unspecified: Secondary | ICD-10-CM | POA: Diagnosis not present

## 2020-06-03 MED FILL — TRIAMCINOLONE ACETONIDE 0.1: 0.1 | 30 days supply | Qty: 80 | Fill #0

## 2020-06-16 MED FILL — ESTRADIOL 0.025 MG/24HR PTT: 0.025 | 84 days supply | Qty: 24 | Fill #1

## 2020-08-21 ENCOUNTER — Other Ambulatory Visit (HOSPITAL_BASED_OUTPATIENT_CLINIC_OR_DEPARTMENT_OTHER): Payer: Self-pay | Admitting: Gastroenterology

## 2020-08-21 DIAGNOSIS — K219 Gastro-esophageal reflux disease without esophagitis: Secondary | ICD-10-CM | POA: Diagnosis not present

## 2020-08-21 MED FILL — PANTOPRAZOLE SOD DR 40 MG T: 40 | 90 days supply | Qty: 90 | Fill #0

## 2020-09-08 ENCOUNTER — Other Ambulatory Visit (HOSPITAL_BASED_OUTPATIENT_CLINIC_OR_DEPARTMENT_OTHER): Payer: Self-pay | Admitting: Obstetrics and Gynecology

## 2020-09-08 DIAGNOSIS — Z1231 Encounter for screening mammogram for malignant neoplasm of breast: Secondary | ICD-10-CM | POA: Diagnosis not present

## 2020-09-08 DIAGNOSIS — Z01419 Encounter for gynecological examination (general) (routine) without abnormal findings: Secondary | ICD-10-CM | POA: Diagnosis not present

## 2020-09-08 DIAGNOSIS — Z6828 Body mass index (BMI) 28.0-28.9, adult: Secondary | ICD-10-CM | POA: Diagnosis not present

## 2020-09-08 MED FILL — ESTRADIOL 0.025 MG/24HR PTT: 0.025 | 84 days supply | Qty: 24 | Fill #0

## 2020-09-30 ENCOUNTER — Ambulatory Visit (INDEPENDENT_AMBULATORY_CARE_PROVIDER_SITE_OTHER): Payer: 59 | Admitting: Physician Assistant

## 2020-09-30 ENCOUNTER — Encounter: Payer: Self-pay | Admitting: Physician Assistant

## 2020-09-30 ENCOUNTER — Other Ambulatory Visit: Payer: Self-pay

## 2020-09-30 DIAGNOSIS — R21 Rash and other nonspecific skin eruption: Secondary | ICD-10-CM | POA: Diagnosis not present

## 2020-09-30 DIAGNOSIS — Z85828 Personal history of other malignant neoplasm of skin: Secondary | ICD-10-CM | POA: Diagnosis not present

## 2020-09-30 DIAGNOSIS — Z86018 Personal history of other benign neoplasm: Secondary | ICD-10-CM | POA: Diagnosis not present

## 2020-09-30 DIAGNOSIS — D485 Neoplasm of uncertain behavior of skin: Secondary | ICD-10-CM

## 2020-09-30 DIAGNOSIS — Z1283 Encounter for screening for malignant neoplasm of skin: Secondary | ICD-10-CM

## 2020-09-30 DIAGNOSIS — L570 Actinic keratosis: Secondary | ICD-10-CM

## 2020-09-30 DIAGNOSIS — L905 Scar conditions and fibrosis of skin: Secondary | ICD-10-CM | POA: Diagnosis not present

## 2020-09-30 NOTE — Progress Notes (Addendum)
   Follow-Up Visit   Subjective  Kendra Le is a 65 y.o. female who presents for the following: Annual Exam (Patient here today for yearly skin check. Per patient she feels a lesion on her back x 3 months that she can't see and she'd like checked, no bleeding. Patient has a personal history of atypical moles, and BCC. No family history of atypical moles, melanoma or non mole skin cancer.). Pink scale on forehead for 6 months to a year. No bleeding or pain.    The following portions of the chart were reviewed this encounter and updated as appropriate:  Tobacco  Allergies  Meds  Problems  Med Hx  Surg Hx  Fam Hx      Objective  Well appearing patient in no apparent distress; mood and affect are within normal limits.  A full examination was performed including scalp, head, eyes, ears, nose, lips, neck, chest, axillae, abdomen, back, buttocks, bilateral upper extremities, bilateral lower extremities, hands, feet, fingers, toes, fingernails, and toenails. All findings within normal limits unless otherwise noted below.  Objective  Mid Back and gluteal fold: No rash present today.  Positive dermatographism.  Objective  chest: Erythematous patches with gritty scale.  Objective  Left Forehead: Pearly papule with telangectasia.      Assessment & Plan  Rash and other nonspecific skin eruption Mid Back and gluteal fold  She was encouraged to take photos if recurs and call the office to get worked in. Start OTC zyrtec  AK (actinic keratosis) chest  Destruction of lesion - chest Complexity: simple   Destruction method: cryotherapy   Informed consent: discussed and consent obtained   Timeout:  patient name, date of birth, surgical site, and procedure verified Lesion destroyed using liquid nitrogen: Yes   Cryotherapy cycles:  3 Outcome: patient tolerated procedure well with no complications   Post-procedure details: wound care instructions given    Neoplasm of uncertain  behavior of skin Left Forehead  Skin / nail biopsy Type of biopsy: tangential   Informed consent: discussed and consent obtained   Timeout: patient name, date of birth, surgical site, and procedure verified   Anesthesia: the lesion was anesthetized in a standard fashion   Anesthetic:  1% lidocaine w/ epinephrine 1-100,000 local infiltration Instrument used: flexible razor blade   Hemostasis achieved with: aluminum chloride and electrodesiccation   Outcome: patient tolerated procedure well   Post-procedure details: wound care instructions given    Specimen 1 - Surgical pathology Differential Diagnosis: bcc vs scc cautery Check Margins: yes   I, Nicolena Schurman, PA-C, have reviewed all documentation's for this visit.  The documentation on 11/04/20 for the exam, diagnosis, procedures and orders are all accurate and complete.

## 2020-09-30 NOTE — Patient Instructions (Signed)

## 2020-11-19 ENCOUNTER — Other Ambulatory Visit (HOSPITAL_BASED_OUTPATIENT_CLINIC_OR_DEPARTMENT_OTHER): Payer: Self-pay

## 2020-11-19 MED FILL — Pantoprazole Sodium EC Tab 40 MG (Base Equiv): ORAL | 90 days supply | Qty: 90 | Fill #0 | Status: AC

## 2020-12-05 ENCOUNTER — Other Ambulatory Visit (HOSPITAL_BASED_OUTPATIENT_CLINIC_OR_DEPARTMENT_OTHER): Payer: Self-pay

## 2020-12-05 MED FILL — Pantoprazole Sodium EC Tab 40 MG (Base Equiv): ORAL | 90 days supply | Qty: 90 | Fill #1 | Status: CN

## 2020-12-05 MED FILL — Estradiol TD Patch Twice Weekly 0.025 MG/24HR: TRANSDERMAL | 28 days supply | Qty: 8 | Fill #0 | Status: AC

## 2021-01-02 ENCOUNTER — Other Ambulatory Visit (HOSPITAL_BASED_OUTPATIENT_CLINIC_OR_DEPARTMENT_OTHER): Payer: Self-pay

## 2021-01-02 MED FILL — Estradiol TD Patch Twice Weekly 0.025 MG/24HR: TRANSDERMAL | 28 days supply | Qty: 8 | Fill #1 | Status: CN

## 2021-01-02 MED FILL — Estradiol TD Patch Twice Weekly 0.025 MG/24HR: TRANSDERMAL | 84 days supply | Qty: 24 | Fill #1 | Status: AC

## 2021-02-24 ENCOUNTER — Other Ambulatory Visit (HOSPITAL_BASED_OUTPATIENT_CLINIC_OR_DEPARTMENT_OTHER): Payer: Self-pay

## 2021-02-24 MED FILL — Pantoprazole Sodium EC Tab 40 MG (Base Equiv): ORAL | 90 days supply | Qty: 90 | Fill #1 | Status: AC

## 2021-03-23 ENCOUNTER — Other Ambulatory Visit (HOSPITAL_BASED_OUTPATIENT_CLINIC_OR_DEPARTMENT_OTHER): Payer: Self-pay

## 2021-03-23 MED FILL — Estradiol TD Patch Twice Weekly 0.025 MG/24HR: TRANSDERMAL | 84 days supply | Qty: 24 | Fill #2 | Status: AC

## 2021-05-01 ENCOUNTER — Other Ambulatory Visit (HOSPITAL_BASED_OUTPATIENT_CLINIC_OR_DEPARTMENT_OTHER): Payer: Self-pay

## 2021-05-01 MED FILL — Pantoprazole Sodium EC Tab 40 MG (Base Equiv): ORAL | 90 days supply | Qty: 90 | Fill #2 | Status: CN

## 2021-05-28 ENCOUNTER — Other Ambulatory Visit (HOSPITAL_BASED_OUTPATIENT_CLINIC_OR_DEPARTMENT_OTHER): Payer: Self-pay

## 2021-05-28 MED FILL — Pantoprazole Sodium EC Tab 40 MG (Base Equiv): ORAL | 90 days supply | Qty: 90 | Fill #2 | Status: AC

## 2021-06-19 ENCOUNTER — Other Ambulatory Visit (HOSPITAL_BASED_OUTPATIENT_CLINIC_OR_DEPARTMENT_OTHER): Payer: Self-pay

## 2021-06-19 MED FILL — Estradiol TD Patch Twice Weekly 0.025 MG/24HR: TRANSDERMAL | 56 days supply | Qty: 16 | Fill #3 | Status: AC

## 2021-07-08 ENCOUNTER — Ambulatory Visit (INDEPENDENT_AMBULATORY_CARE_PROVIDER_SITE_OTHER): Payer: 59 | Admitting: Family Medicine

## 2021-07-08 ENCOUNTER — Encounter: Payer: Self-pay | Admitting: Family Medicine

## 2021-07-08 VITALS — BP 122/82 | Ht 62.0 in | Wt 167.0 lb

## 2021-07-08 DIAGNOSIS — M25562 Pain in left knee: Secondary | ICD-10-CM | POA: Diagnosis not present

## 2021-07-08 MED ORDER — METHYLPREDNISOLONE ACETATE 40 MG/ML IJ SUSP
40.0000 mg | Freq: Once | INTRAMUSCULAR | Status: AC
  Administered 2021-07-08: 40 mg via INTRA_ARTICULAR

## 2021-07-08 NOTE — Progress Notes (Signed)
PCP: Shirline Frees, MD  Subjective:   HPI: Patient is a 66 y.o. female here for left knee pain.  Patient denies known injury or trauma. Started to develop insidious medial left knee pain about 2 weeks ago. Associated swelling. No increase in activity level preceding this. She walks daily. Sometimes knee feels unstable but unsure if this is due to pain.  Past Medical History:  Diagnosis Date   Atypical nevus 05/23/2000   Left Upper Arm-Mild   Atypical nevus 05/25/2006   Right Calf-Moderate   Atypical nevus 01/23/2010   Left Upper Inner Arm-Mild   Atypical nevus 08/06/2015   Left Forearm-Mild   BCC (basal cell carcinoma of skin) 03/31/2004   Right Lower Back (tx p bx)   GERD (gastroesophageal reflux disease)    Hiatal hernia    Migraine    Superficial basal cell carcinoma (BCC) 10/15/1999   Lower Post Neck, Left Mid Back,Left Lower Back and Upper Sternum (all Cx3,5FU)   Superficial basal cell carcinoma (BCC) 03/01/2014   Left Post Shoulder (Cx3,5FU)   Superficial basal cell carcinoma (BCC) 06/26/2014   Right Shoulder (Cx3,5FU)   Superficial basal cell carcinoma (BCC) 06/28/2017   Sup Upper Sternum (tx p bx)    Current Outpatient Medications on File Prior to Visit  Medication Sig Dispense Refill   aspirin 81 MG tablet Take 81 mg by mouth daily.     calcium-vitamin D (OSCAL WITH D) 500-200 MG-UNIT per tablet Take 1 tablet by mouth at bedtime.     COVID-19 Specimen Collection KIT See admin instructions. for testing     cyclobenzaprine (FLEXERIL) 10 MG tablet   1   estradiol (VIVELLE-DOT) 0.025 MG/24HR APPLY 1 PATCH ON TO THE SKIN TWICE WEEKLY 24 patch 3   Multiple Vitamins-Minerals (MULTIVITAMINS THER. W/MINERALS) TABS Take 1 tablet by mouth 3 (three) times daily.     NON FORMULARY [facility]  Scar Cream -  Verapamil 10%, Pentoxifylline 5% / Diclofenac 3% Apply 1-2 grams to affected area 3-4 times daily Qty. 120 gm 3 refills     pantoprazole (PROTONIX) 40 MG  tablet TAKE ONE TABLET BY MOUTH 20 MINUTES BEFORE BREAKFAST 90 tablet 4   No current facility-administered medications on file prior to visit.    Past Surgical History:  Procedure Laterality Date   ABDOMINAL HYSTERECTOMY     ANTERIOR CRUCIATE LIGAMENT REPAIR     left   BREAST SURGERY     CHOLECYSTECTOMY      Allergies  Allergen Reactions   Erythromycin    Latex Other (See Comments)    Other   Penicillins Nausea And Vomiting    BP 122/82    Ht _0  (1.575 m)    Wt 167 lb (75.8 kg)    BMI 30.54 kg/m   [facility] 07/08/2021  Frequency of aerobic exercise (# of days/week) 7  Average time in minutes 30  Frequency of strengthening activities (# of days/week) 2    No flowsheet data found.      Objective:  Physical Exam:  Gen: NAD, comfortable in exam room  Left knee: No gross deformity, ecchymoses. Mild effusion. Minimal TTP medial joint line. FROM with normal strength. Negative ant/post drawers. Negative valgus/varus testing. Negative lachman. Mild pain mcmurrays, apleys.  Negative thessalys. NV intact distally.   Assessment & Plan:  1. Left knee pain - 2/2 flare of arthritis vs less likely degenerative medial meniscus tear.  Home exercises.  Tylenol, topical medications, supplements, aleve if needed.  Steroid injection given today due to severity of pain.  F/u in 1 month or prn.  After informed written consent timeout was performed, patient was seated on exam table. Left knee was prepped with alcohol swab and utilizing anteromedial approach, patient's left knee was injected intraarticularly with 3:1 lidocaine: depomedrol. Patient tolerated the procedure well without immediate complications.

## 2021-07-08 NOTE — Patient Instructions (Signed)
Your pain is due to a flare of arthritis. These are the different medications you can take for this: Tylenol 500mg  1-2 tabs three times a day for pain. Capsaicin, aspercreme, or biofreeze topically up to four times a day may also help with pain. Some supplements that may help for arthritis: Boswellia extract, curcumin, pycnogenol Aleve 1-2 tabs twice a day with food Cortisone injections are an option - you were given this today. If cortisone injections do not help, there are different types of shots that may help but they take longer to take effect. It's important that you continue to stay active. Straight leg raises, knee extensions 3 sets of 10 once a day (add ankle weight if these become too easy). Consider physical therapy to strengthen muscles around the joint that hurts to take pressure off of the joint itself. Shoe inserts with good arch support may be helpful. Heat or ice 15 minutes at a time 3-4 times a day as needed to help with pain. Water aerobics and cycling with low resistance are the best two types of exercise for arthritis though any exercise is ok as long as it doesn't worsen the pain. Follow up with me in 1 month or as needed if you're doing well.

## 2021-08-20 ENCOUNTER — Other Ambulatory Visit (HOSPITAL_BASED_OUTPATIENT_CLINIC_OR_DEPARTMENT_OTHER): Payer: Self-pay

## 2021-08-20 MED ORDER — ESTRADIOL 0.025 MG/24HR TD PTTW
MEDICATED_PATCH | TRANSDERMAL | 0 refills | Status: DC
  Filled 2021-08-20: qty 24, 84d supply, fill #0

## 2021-08-20 MED FILL — Pantoprazole Sodium EC Tab 40 MG (Base Equiv): ORAL | 90 days supply | Qty: 90 | Fill #3 | Status: AC

## 2021-08-24 ENCOUNTER — Other Ambulatory Visit (HOSPITAL_BASED_OUTPATIENT_CLINIC_OR_DEPARTMENT_OTHER): Payer: Self-pay

## 2021-08-27 ENCOUNTER — Other Ambulatory Visit (HOSPITAL_BASED_OUTPATIENT_CLINIC_OR_DEPARTMENT_OTHER): Payer: Self-pay

## 2021-08-27 DIAGNOSIS — E669 Obesity, unspecified: Secondary | ICD-10-CM | POA: Diagnosis not present

## 2021-08-27 DIAGNOSIS — G2581 Restless legs syndrome: Secondary | ICD-10-CM | POA: Diagnosis not present

## 2021-08-27 DIAGNOSIS — K219 Gastro-esophageal reflux disease without esophagitis: Secondary | ICD-10-CM | POA: Diagnosis not present

## 2021-08-27 MED ORDER — PANTOPRAZOLE SODIUM 40 MG PO TBEC
40.0000 mg | DELAYED_RELEASE_TABLET | Freq: Every day | ORAL | 4 refills | Status: AC
  Filled 2021-08-27 – 2021-11-19 (×2): qty 90, 90d supply, fill #0
  Filled 2022-02-13: qty 90, 90d supply, fill #1
  Filled 2022-05-23: qty 90, 90d supply, fill #2
  Filled 2022-08-22: qty 90, 90d supply, fill #3

## 2021-09-01 ENCOUNTER — Ambulatory Visit (INDEPENDENT_AMBULATORY_CARE_PROVIDER_SITE_OTHER): Payer: 59

## 2021-09-01 ENCOUNTER — Ambulatory Visit: Payer: Self-pay

## 2021-09-01 ENCOUNTER — Encounter: Payer: Self-pay | Admitting: Orthopaedic Surgery

## 2021-09-01 ENCOUNTER — Other Ambulatory Visit: Payer: Self-pay

## 2021-09-01 ENCOUNTER — Ambulatory Visit (INDEPENDENT_AMBULATORY_CARE_PROVIDER_SITE_OTHER): Payer: 59 | Admitting: Orthopaedic Surgery

## 2021-09-01 VITALS — Ht 62.0 in | Wt 168.0 lb

## 2021-09-01 DIAGNOSIS — M1711 Unilateral primary osteoarthritis, right knee: Secondary | ICD-10-CM | POA: Diagnosis not present

## 2021-09-01 DIAGNOSIS — M1712 Unilateral primary osteoarthritis, left knee: Secondary | ICD-10-CM | POA: Diagnosis not present

## 2021-09-01 MED ORDER — DICLOFENAC SODIUM 2 % EX SOLN: 2.0000 g | Freq: Two times a day (BID) | CUTANEOUS | 3 refills | Status: DC | PRN

## 2021-09-01 NOTE — Progress Notes (Signed)
? ?Office Visit Note ?  ?Patient: Kendra Le           ?Date of Birth: 01/01/1956           ?MRN: [id number] ?Visit Date: 09/01/2021 ?             ?Requested by: Shirline Frees, MD ?[address] ?[address] ?[place],  [place] [address] ?PCP: Shirline Frees, MD ? ? ?Assessment & Plan: ?Visit Diagnoses:  ?1. Primary osteoarthritis of right knee   ?2. Primary osteoarthritis of left knee   ? ? ?Plan: Impression is bilateral knee osteoarthritis worse on the left especially in the patellofemoral region.  Treatment options were discussed and their pros and cons.  Natural supplements were also discussed.  Based on her options she would like to try some Pennsaid and she will let us know if she wants to try Visco injections. ? ?Follow-Up Instructions: No follow-ups on file.  ? ?Orders:  ?Orders Placed This Encounter  ?Procedures  ? XR KNEE 3 VIEW LEFT  ? XR KNEE 3 VIEW RIGHT  ? ?Meds ordered this encounter  ?Medications  ? Diclofenac Sodium (PENNSAID) 2 % SOLN  ?  Sig: Apply 2 g topically 2 (two) times daily as needed (to affected area).  ?  Dispense:  112 g  ?  Refill:  3  ? ? ? ? Procedures: ?No procedures performed ? ? ?Clinical Data: ?No additional findings. ? ? ?Subjective: ?Chief Complaint  ?Patient presents with  ? Left Knee - Pain  ? Right Knee - Pain  ? ? ?HPI ? ?Kendra Le is a very pleasant 66 year old female who works in the [employer], [employer].  She comes in for evaluation of left greater than right knee pain with decreased range of motion recently.  She has been seeing Dr. Barbaraann Barthel sports medicine for several years.  She did undergo ACL reconstruction in the left knee in 1996.  She had a cortisone injection left knee in January by Dr. Barbaraann Barthel which has helped.  Her main difficulty is squatting and kneeling and using steps.  The swelling has improved since the cortisone injection.  She wears knee sleeves during activity. ? ?Review of Systems  ?Constitutional: Negative.   ?HENT: Negative.    ?Eyes: Negative.    ?Respiratory: Negative.    ?Cardiovascular: Negative.   ?Endocrine: Negative.   ?Musculoskeletal: Negative.   ?Neurological: Negative.   ?Hematological: Negative.   ?Psychiatric/Behavioral: Negative.    ?All other systems reviewed and are negative. ? ? ?Objective: ?Vital Signs: Ht '5\' 2"'$  (1.575 m)   Wt 168 lb (76.2 kg)   BMI 30.73 kg/m?  ? ?Physical Exam ?Vitals and nursing note reviewed.  ?Constitutional:   ?   Appearance: She is well-developed.  ?HENT:  ?   Head: Normocephalic and atraumatic.  ?Pulmonary:  ?   Effort: Pulmonary effort is normal.  ?Abdominal:  ?   Palpations: Abdomen is soft.  ?Musculoskeletal:  ?   Cervical back: Neck supple.  ?Skin: ?   General: Skin is warm.  ?   Capillary Refill: Capillary refill takes less than 2 seconds.  ?Neurological:  ?   Mental Status: She is alert and oriented to person, place, and time.  ?Psychiatric:     ?   Behavior: Behavior normal.     ?   Thought Content: Thought content normal.     ?   Judgment: Judgment normal.  ? ? ?Ortho Exam ? ?Examination of bilateral knees show mild patellofemoral crepitus.  No joint effusion.  Collaterals and cruciates are stable.  No joint line tenderness. ? ?Specialty Comments:  ?No specialty comments available. ? ?Imaging: ?XR KNEE 3 VIEW LEFT ? ?Result Date: 09/01/2021 ?Moderate osteoarthritis worst in the patellofemoral compartment with spurring and joint space narrowing.  Femoral-tibial space well-preserved. ? ?XR KNEE 3 VIEW RIGHT ? ?Result Date: 09/01/2021 ?Mild tricompartmental osteoarthritis.  Well-preserved joint spaces.  ? ? ?PMFS History: ?Patient Active Problem List  ? Diagnosis Date Noted  ? Primary osteoarthritis of right knee 09/01/2021  ? Primary osteoarthritis of left knee 09/01/2021  ? Ingrown toenail 12/21/2019  ? Left hip pain 09/04/2017  ? Right shoulder pain 09/04/2017  ? Arthralgia of right temporomandibular joint 11/16/2016  ? Sensorineural hearing loss (SNHL) of both ears 11/16/2016  ? Tinnitus of left ear  11/16/2016  ? Synovial cyst 08/07/2015  ? Trochanteric bursitis of both hips 10/22/2014  ? Plantar fasciitis, left 12/24/2013  ? Left knee pain 10/15/2011  ? ?Past Medical History:  ?Diagnosis Date  ? Atypical nevus 05/23/2000  ? Left Upper Arm-Mild  ? Atypical nevus 05/25/2006  ? Right Calf-Moderate  ? Atypical nevus 01/23/2010  ? Left Upper Inner Arm-Mild  ? Atypical nevus 08/06/2015  ? Left Forearm-Mild  ? BCC (basal cell carcinoma of skin) 03/31/2004  ? Right Lower Back (tx p bx)  ? GERD (gastroesophageal reflux disease)   ? Hiatal hernia   ? Migraine   ? Superficial basal cell carcinoma (BCC) 10/15/1999  ? Lower Post Neck, Left Mid Back,Left Lower Back and Upper Sternum (all Cx3,5FU)  ? Superficial basal cell carcinoma (BCC) 03/01/2014  ? Left Post Shoulder (Cx3,5FU)  ? Superficial basal cell carcinoma (BCC) 06/26/2014  ? Right Shoulder (Cx3,5FU)  ? Superficial basal cell carcinoma (BCC) 06/28/2017  ? Sup Upper Sternum (tx p bx)  ?  ?Family History  ?Problem Relation Age of Onset  ? Heart attack Mother   ? Hyperlipidemia Mother   ? Diabetes Mother   ? Heart attack Father   ? Hyperlipidemia Father   ? Hypertension Father   ? Sudden death Neg Hx   ?  ?Past Surgical History:  ?Procedure Laterality Date  ? ABDOMINAL HYSTERECTOMY    ? ANTERIOR CRUCIATE LIGAMENT REPAIR    ? left  ? BREAST SURGERY    ? CHOLECYSTECTOMY    ? ?Social History  ? ?Occupational History  ? Not on file  ?Tobacco Use  ? Smoking status: Never  ? Smokeless tobacco: Never  ?Substance and Sexual Activity  ? Alcohol use: No  ?  Alcohol/week: 0.0 standard drinks  ? Drug use: No  ? Sexual activity: Not on file  ? ? ? ? ? ? ?

## 2021-09-11 ENCOUNTER — Other Ambulatory Visit (HOSPITAL_BASED_OUTPATIENT_CLINIC_OR_DEPARTMENT_OTHER): Payer: Self-pay

## 2021-09-11 DIAGNOSIS — Z1231 Encounter for screening mammogram for malignant neoplasm of breast: Secondary | ICD-10-CM | POA: Diagnosis not present

## 2021-09-11 DIAGNOSIS — Z01419 Encounter for gynecological examination (general) (routine) without abnormal findings: Secondary | ICD-10-CM | POA: Diagnosis not present

## 2021-09-11 DIAGNOSIS — Z6831 Body mass index (BMI) 31.0-31.9, adult: Secondary | ICD-10-CM | POA: Diagnosis not present

## 2021-09-11 MED ORDER — SCOPOLAMINE 1 MG/3DAYS TD PT72
1.5000 mg | MEDICATED_PATCH | TRANSDERMAL | 1 refills | Status: AC | PRN
  Filled 2021-09-11: qty 4, 12d supply, fill #0
  Filled 2022-08-29: qty 4, 12d supply, fill #1

## 2021-09-11 MED ORDER — ESTRADIOL 0.025 MG/24HR TD PTTW
1.0000 | MEDICATED_PATCH | TRANSDERMAL | 3 refills | Status: AC
  Filled 2021-09-11 – 2021-11-19 (×2): qty 24, 84d supply, fill #0
  Filled 2022-02-07: qty 24, 84d supply, fill #1
  Filled 2022-05-04: qty 24, 84d supply, fill #2
  Filled 2022-07-29 (×2): qty 24, 84d supply, fill #3

## 2021-09-22 ENCOUNTER — Ambulatory Visit (INDEPENDENT_AMBULATORY_CARE_PROVIDER_SITE_OTHER): Payer: 59 | Admitting: Family Medicine

## 2021-09-22 VITALS — BP 110/78 | Ht 62.0 in | Wt 165.0 lb

## 2021-09-22 DIAGNOSIS — M25551 Pain in right hip: Secondary | ICD-10-CM | POA: Diagnosis not present

## 2021-09-22 DIAGNOSIS — M25552 Pain in left hip: Secondary | ICD-10-CM

## 2021-09-22 MED ORDER — METHYLPREDNISOLONE ACETATE 80 MG/ML IJ SUSP
80.0000 mg | Freq: Once | INTRAMUSCULAR | Status: AC
  Administered 2021-09-22: 80 mg via INTRA_ARTICULAR

## 2021-09-22 NOTE — Progress Notes (Signed)
PCP: Shirline Frees, MD ? ?Subjective:  ? ?HPI: ?Patient is a 66 y.o. female here for bilateral hip pain. ? ?Patient reports for about 2 to 3 months she has had worsening bilateral hip pain laterally. ?Pain is worse with stairs and lying on her side. ?No numbness or tingling, back pain. ?Pain does not radiate down past her knees. ?She has prior history of trochanteric bursitis reports this feels differently. ?This is bothering her enough she is interested in injections. ? ? ?Past Medical History:  ?Diagnosis Date  ? Atypical nevus 05/23/2000  ? Left Upper Arm-Mild  ? Atypical nevus 05/25/2006  ? Right Calf-Moderate  ? Atypical nevus 01/23/2010  ? Left Upper Inner Arm-Mild  ? Atypical nevus 08/06/2015  ? Left Forearm-Mild  ? BCC (basal cell carcinoma of skin) 03/31/2004  ? Right Lower Back (tx p bx)  ? GERD (gastroesophageal reflux disease)   ? Hiatal hernia   ? Migraine   ? Superficial basal cell carcinoma (BCC) 10/15/1999  ? Lower Post Neck, Left Mid Back,Left Lower Back and Upper Sternum (all Cx3,5FU)  ? Superficial basal cell carcinoma (BCC) 03/01/2014  ? Left Post Shoulder (Cx3,5FU)  ? Superficial basal cell carcinoma (BCC) 06/26/2014  ? Right Shoulder (Cx3,5FU)  ? Superficial basal cell carcinoma (BCC) 06/28/2017  ? Sup Upper Sternum (tx p bx)  ? ? ?Current Outpatient Medications on File Prior to Visit  ?Medication Sig Dispense Refill  ? calcium-vitamin D (OSCAL WITH D) 500-200 MG-UNIT per tablet Take 1 tablet by mouth at bedtime.    ? COVID-19 Specimen Collection KIT See admin instructions. for testing    ? cyclobenzaprine (FLEXERIL) 10 MG tablet   1  ? Diclofenac Sodium (PENNSAID) 2 % SOLN Apply 2 g topically 2 (two) times daily as needed (to affected area). 112 g 3  ? estradiol (VIVELLE-DOT) 0.025 MG/24HR APPLY 1 PATCH ON TO THE SKIN TWICE WEEKLY 24 patch 3  ? estradiol (VIVELLE-DOT) 0.025 MG/24HR Place 1 patch onto the skin 2 (two) times a week. 24 patch 3  ? Multiple Vitamins-Minerals (MULTIVITAMINS THER.  W/MINERALS) TABS Take 1 tablet by mouth 3 (three) times daily.    ? pantoprazole (PROTONIX) 40 MG tablet Take 1 tablet (40 mg total) by mouth daily 20 minutes before breakfast. 90 tablet 4  ? scopolamine (TRANSDERM-SCOP) 1 MG/3DAYS Place 1 patch onto the skin every 3 (three) days as needed. 4 patch 1  ? ?No current facility-administered medications on file prior to visit.  ? ? ?Past Surgical History:  ?Procedure Laterality Date  ? ABDOMINAL HYSTERECTOMY    ? ANTERIOR CRUCIATE LIGAMENT REPAIR    ? left  ? BREAST SURGERY    ? CHOLECYSTECTOMY    ? ? ?Allergies  ?Allergen Reactions  ? Erythromycin   ? Latex Other (See Comments)  ?  Other  ? Penicillins Nausea And Vomiting  ? ? ?BP 110/78   Ht 5' 2"  (1.575 m)   Wt 165 lb (74.8 kg)   BMI 30.18 kg/m?  ? ? ?  07/08/2021  ? 10:02 AM 09/22/2021  ?  1:34 PM  ?[place] Adult Exercise  ?Frequency of aerobic exercise (# of days/week) 7 7  ?Average time in minutes 30 30  ?Frequency of strengthening activities (# of days/week) 2 2  ? ? ?   ? View : No data to display.  ?  ?  ?  ? ? ?    ?Objective:  ?Physical Exam: ? ?Gen: NAD, comfortable in exam room ? ?  Bilateral hips: ?No deformity. ?FROM with 5/5 strength except 4/5 hip abduction bilaterally. ?Tenderness to palpation over greater trochanters. ?NVI distally. ?Negative logroll ?Negative faber, fadir, and piriformis stretches. ?  ?Assessment & Plan:  ?1.  Bilateral hip pain: Secondary to bilateral greater trochanteric pain syndrome.  Reviewed home exercises and stretches.  Icing, Tylenol, and/or Aleve if needed for pain.  Injections given today as below.  Follow-up as needed.  Consider formal physical therapy if not improving. ? ?After informed written consent timeout was performed, patient was lying on left side on exam table.  Area overlying right trochanteric bursa prepped with alcohol swab then injected with 6:1 lidocaine: depomedrol 14m.  Patient tolerated procedure well without immediate  complications. ? ?After informed written consent timeout was performed, patient was lying on right side on exam table.  Area overlying left trochanteric bursa prepped with alcohol swab then injected with 6:1 lidocaine: depomedrol 87m  Patient tolerated procedure well without immediate complications. ? ?

## 2021-09-22 NOTE — Patient Instructions (Signed)
You have greater trochanteric pain syndrome. ?Avoid painful activities as much as possible. ?Ice over area of pain 3-4 times a day for 15 minutes at a time ?Hip side raise exercise 3 sets of 10 once a day - add weights if this becomes too easy. ?Stretches - pick 2-3 and hold for 20-30 seconds x 3 - do once or twice a day. ?Tylenol and/or aleve as needed for pain. ?You were given steroid injections today. ?Follow up with me as needed. ?Consider formal physical therapy if not improving. ? ?

## 2021-09-23 DIAGNOSIS — H524 Presbyopia: Secondary | ICD-10-CM | POA: Diagnosis not present

## 2021-09-30 ENCOUNTER — Encounter: Payer: Self-pay | Admitting: Physician Assistant

## 2021-09-30 ENCOUNTER — Ambulatory Visit (INDEPENDENT_AMBULATORY_CARE_PROVIDER_SITE_OTHER): Payer: 59 | Admitting: Physician Assistant

## 2021-09-30 DIAGNOSIS — Z85828 Personal history of other malignant neoplasm of skin: Secondary | ICD-10-CM

## 2021-09-30 DIAGNOSIS — Z1283 Encounter for screening for malignant neoplasm of skin: Secondary | ICD-10-CM

## 2021-09-30 DIAGNOSIS — Z86018 Personal history of other benign neoplasm: Secondary | ICD-10-CM | POA: Diagnosis not present

## 2021-09-30 DIAGNOSIS — L821 Other seborrheic keratosis: Secondary | ICD-10-CM

## 2021-09-30 NOTE — Progress Notes (Signed)
? ?  Follow-Up Visit ?  ?Subjective  ?Kendra Le is a 66 y.o. female who presents for the following: Annual Exam (New lesion on right cheek x 1 week- seems to be going away. No family history of melanoma or non mole skin cancer. Personal history of non mole skin cancers, atypical nevi but no melanoma. ). ? ? ?The following portions of the chart were reviewed this encounter and updated as appropriate:  Tobacco  Allergies  Meds  Problems  Med Hx  Surg Hx  Fam Hx   ?  ? ?Objective  ?Well appearing patient in no apparent distress; mood and affect are within normal limits. ? ?A full examination was performed including scalp, head, eyes, ears, nose, lips, neck, chest, axillae, abdomen, back, buttocks, bilateral upper extremities, bilateral lower extremities, hands, feet, fingers, toes, fingernails, and toenails. All findings within normal limits unless otherwise noted below. ? ?No atypical nevi No signs of non-mole skin cancer.  ? ?Mid Back ?Stuck-on, crusty plaques.  ? ? ?Assessment & Plan  ?Screening for malignant neoplasm of skin ? ?Yearly skin exam. ? ?Seborrheic keratosis ?Mid Back ? ?Benign- ok to leave if stable ? ? ? ?I, Keyleen Cerrato, PA-C, have reviewed all documentation's for this visit.  The documentation on 09/30/21 for the exam, diagnosis, procedures and orders are all accurate and complete. ?

## 2021-11-06 ENCOUNTER — Other Ambulatory Visit (HOSPITAL_BASED_OUTPATIENT_CLINIC_OR_DEPARTMENT_OTHER): Payer: Self-pay

## 2021-11-06 DIAGNOSIS — G2581 Restless legs syndrome: Secondary | ICD-10-CM | POA: Diagnosis not present

## 2021-11-06 DIAGNOSIS — M62830 Muscle spasm of back: Secondary | ICD-10-CM | POA: Diagnosis not present

## 2021-11-06 DIAGNOSIS — R42 Dizziness and giddiness: Secondary | ICD-10-CM | POA: Diagnosis not present

## 2021-11-06 MED ORDER — ROPINIROLE HCL 0.5 MG PO TABS
ORAL_TABLET | ORAL | 5 refills | Status: AC
  Filled 2021-11-06: qty 30, 30d supply, fill #0
  Filled 2021-12-15: qty 30, 30d supply, fill #1
  Filled 2022-01-10: qty 30, 30d supply, fill #2

## 2021-11-06 MED ORDER — CYCLOBENZAPRINE HCL 5 MG PO TABS
ORAL_TABLET | ORAL | 5 refills | Status: AC
  Filled 2021-11-06: qty 30, 30d supply, fill #0
  Filled 2021-12-15: qty 30, 30d supply, fill #1
  Filled 2022-01-10: qty 30, 30d supply, fill #2
  Filled 2022-02-07: qty 30, 30d supply, fill #3
  Filled 2022-03-11: qty 30, 30d supply, fill #4

## 2021-11-06 MED ORDER — MECLIZINE HCL 12.5 MG PO TABS
ORAL_TABLET | ORAL | 5 refills | Status: AC
  Filled 2021-11-06: qty 30, 15d supply, fill #0
  Filled 2021-12-15: qty 30, 15d supply, fill #1
  Filled 2022-01-10: qty 30, 15d supply, fill #2
  Filled 2022-02-07: qty 30, 15d supply, fill #3
  Filled 2022-04-26: qty 30, 15d supply, fill #4

## 2021-11-19 ENCOUNTER — Other Ambulatory Visit (HOSPITAL_BASED_OUTPATIENT_CLINIC_OR_DEPARTMENT_OTHER): Payer: Self-pay

## 2021-11-20 ENCOUNTER — Other Ambulatory Visit (HOSPITAL_BASED_OUTPATIENT_CLINIC_OR_DEPARTMENT_OTHER): Payer: Self-pay

## 2021-12-16 ENCOUNTER — Other Ambulatory Visit (HOSPITAL_BASED_OUTPATIENT_CLINIC_OR_DEPARTMENT_OTHER): Payer: Self-pay

## 2022-01-11 ENCOUNTER — Other Ambulatory Visit (HOSPITAL_BASED_OUTPATIENT_CLINIC_OR_DEPARTMENT_OTHER): Payer: Self-pay

## 2022-01-26 ENCOUNTER — Other Ambulatory Visit (HOSPITAL_BASED_OUTPATIENT_CLINIC_OR_DEPARTMENT_OTHER): Payer: Self-pay

## 2022-01-26 MED ORDER — ROPINIROLE HCL 1 MG PO TABS
1.0000 mg | ORAL_TABLET | Freq: Every day | ORAL | 3 refills | Status: AC
  Filled 2022-01-26: qty 30, 30d supply, fill #0

## 2022-01-27 ENCOUNTER — Other Ambulatory Visit (HOSPITAL_COMMUNITY): Payer: Self-pay

## 2022-01-27 MED ORDER — ROPINIROLE HCL 1 MG PO TABS
1.0000 mg | ORAL_TABLET | Freq: Every evening | ORAL | 3 refills | Status: DC
  Filled 2022-02-07 – 2022-02-27 (×3): qty 90, 90d supply, fill #0
  Filled 2022-05-30: qty 90, 90d supply, fill #1
  Filled 2022-08-29: qty 90, 90d supply, fill #2
  Filled 2022-11-28: qty 90, 90d supply, fill #3

## 2022-02-08 ENCOUNTER — Other Ambulatory Visit (HOSPITAL_BASED_OUTPATIENT_CLINIC_OR_DEPARTMENT_OTHER): Payer: Self-pay

## 2022-02-09 DIAGNOSIS — K219 Gastro-esophageal reflux disease without esophagitis: Secondary | ICD-10-CM | POA: Diagnosis not present

## 2022-02-09 DIAGNOSIS — R194 Change in bowel habit: Secondary | ICD-10-CM | POA: Diagnosis not present

## 2022-02-09 DIAGNOSIS — E669 Obesity, unspecified: Secondary | ICD-10-CM | POA: Diagnosis not present

## 2022-02-15 ENCOUNTER — Other Ambulatory Visit (HOSPITAL_BASED_OUTPATIENT_CLINIC_OR_DEPARTMENT_OTHER): Payer: Self-pay

## 2022-03-01 ENCOUNTER — Other Ambulatory Visit (HOSPITAL_BASED_OUTPATIENT_CLINIC_OR_DEPARTMENT_OTHER): Payer: Self-pay

## 2022-03-11 ENCOUNTER — Other Ambulatory Visit (HOSPITAL_BASED_OUTPATIENT_CLINIC_OR_DEPARTMENT_OTHER): Payer: Self-pay

## 2022-04-27 ENCOUNTER — Other Ambulatory Visit (HOSPITAL_BASED_OUTPATIENT_CLINIC_OR_DEPARTMENT_OTHER): Payer: Self-pay

## 2022-05-04 ENCOUNTER — Other Ambulatory Visit (HOSPITAL_BASED_OUTPATIENT_CLINIC_OR_DEPARTMENT_OTHER): Payer: Self-pay

## 2022-05-24 ENCOUNTER — Other Ambulatory Visit (HOSPITAL_BASED_OUTPATIENT_CLINIC_OR_DEPARTMENT_OTHER): Payer: Self-pay

## 2022-07-29 ENCOUNTER — Other Ambulatory Visit (HOSPITAL_BASED_OUTPATIENT_CLINIC_OR_DEPARTMENT_OTHER): Payer: Self-pay

## 2022-08-23 ENCOUNTER — Other Ambulatory Visit (HOSPITAL_BASED_OUTPATIENT_CLINIC_OR_DEPARTMENT_OTHER): Payer: Self-pay

## 2022-08-24 DIAGNOSIS — Z1283 Encounter for screening for malignant neoplasm of skin: Secondary | ICD-10-CM | POA: Diagnosis not present

## 2022-08-24 DIAGNOSIS — Z08 Encounter for follow-up examination after completed treatment for malignant neoplasm: Secondary | ICD-10-CM | POA: Diagnosis not present

## 2022-08-24 DIAGNOSIS — L821 Other seborrheic keratosis: Secondary | ICD-10-CM | POA: Diagnosis not present

## 2022-08-24 DIAGNOSIS — L570 Actinic keratosis: Secondary | ICD-10-CM | POA: Diagnosis not present

## 2022-08-24 DIAGNOSIS — L568 Other specified acute skin changes due to ultraviolet radiation: Secondary | ICD-10-CM | POA: Diagnosis not present

## 2022-08-24 DIAGNOSIS — Z85828 Personal history of other malignant neoplasm of skin: Secondary | ICD-10-CM | POA: Diagnosis not present

## 2022-08-24 DIAGNOSIS — D485 Neoplasm of uncertain behavior of skin: Secondary | ICD-10-CM | POA: Diagnosis not present

## 2022-08-24 DIAGNOSIS — L2089 Other atopic dermatitis: Secondary | ICD-10-CM | POA: Diagnosis not present

## 2022-08-30 ENCOUNTER — Other Ambulatory Visit (HOSPITAL_BASED_OUTPATIENT_CLINIC_OR_DEPARTMENT_OTHER): Payer: Self-pay

## 2022-08-30 MED ORDER — BETAMETHASONE DIPROPIONATE 0.05 % EX CREA
TOPICAL_CREAM | CUTANEOUS | 3 refills | Status: AC
  Filled 2022-08-30: qty 45, 30d supply, fill #0

## 2022-09-06 ENCOUNTER — Other Ambulatory Visit (HOSPITAL_BASED_OUTPATIENT_CLINIC_OR_DEPARTMENT_OTHER): Payer: Self-pay

## 2022-09-06 MED ORDER — BETAMETHASONE DIPROPIONATE 0.05 % EX CREA: 1.0000 | TOPICAL_CREAM | Freq: Every day | CUTANEOUS | 6 refills | Status: AC

## 2022-09-08 ENCOUNTER — Telehealth: Payer: Self-pay | Admitting: Family

## 2022-09-08 DIAGNOSIS — J209 Acute bronchitis, unspecified: Secondary | ICD-10-CM

## 2022-09-08 MED ORDER — BENZONATATE 100 MG PO CAPS
100.0000 mg | ORAL_CAPSULE | Freq: Three times a day (TID) | ORAL | 0 refills | Status: AC | PRN
  Filled 2022-09-08: qty 20, 7d supply, fill #0

## 2022-09-08 NOTE — Progress Notes (Signed)
E-Visit for Cough  We are sorry that you are not feeling well.  Here is how we plan to help!  Based on your presentation I believe you most likely have A cough due to a virus.  This is called viral bronchitis and is best treated by rest, plenty of fluids and control of the cough.  You may use Ibuprofen or Tylenol as directed to help your symptoms.     In addition you may use A non-prescription cough medication called Robitussin DAC. Take 2 teaspoons every 8 hours or Delsym: take 2 teaspoons every 12 hours., A non-prescription cough medication called Mucinex DM: take 2 tablets every 12 hours., and A prescription cough medication called Tessalon Perles 100mg . You may take 1-2 capsules every 8 hours as needed for your cough.  From your responses in the eVisit questionnaire you describe inflammation in the upper respiratory tract which is causing a significant cough.  This is commonly called Bronchitis and has four common causes:   Allergies Viral Infections Acid Reflux Bacterial Infection Allergies, viruses and acid reflux are treated by controlling symptoms or eliminating the cause. An example might be a cough caused by taking certain blood pressure medications. You stop the cough by changing the medication. Another example might be a cough caused by acid reflux. Controlling the reflux helps control the cough.  USE OF BRONCHODILATOR ("RESCUE") INHALERS: There is a risk from using your bronchodilator too frequently.  The risk is that over-reliance on a medication which only relaxes the muscles surrounding the breathing tubes can reduce the effectiveness of medications prescribed to reduce swelling and congestion of the tubes themselves.  Although you feel brief relief from the bronchodilator inhaler, your asthma may actually be worsening with the tubes becoming more swollen and filled with mucus.  This can delay other crucial treatments, such as oral steroid medications. If you need to use a  bronchodilator inhaler daily, several times per day, you should discuss this with your provider.  There are probably better treatments that could be used to keep your asthma under control.     HOME CARE Only take medications as instructed by your medical team. Complete the entire course of an antibiotic. Drink plenty of fluids and get plenty of rest. Avoid close contacts especially the very young and the elderly Cover your mouth if you cough or cough into your sleeve. Always remember to wash your hands A steam or ultrasonic humidifier can help congestion.   GET HELP RIGHT AWAY IF: You develop worsening fever. You become short of breath You cough up blood. Your symptoms persist after you have completed your treatment plan MAKE SURE YOU  Understand these instructions. Will watch your condition. Will get help right away if you are not doing well or get worse.    Thank you for choosing an e-visit.  Your e-visit answers were reviewed by a board certified advanced clinical practitioner to complete your personal care plan. Depending upon the condition, your plan could have included both over the counter or prescription medications.  Please review your pharmacy choice. Make sure the pharmacy is open so you can pick up prescription now. If there is a problem, you may contact your provider through [employer] and have the prescription routed to another pharmacy.  Your safety is important to [place]. If you have drug allergies check your prescription carefully.   For the next 24 hours you can use MyChart to ask questions about today's visit, request a non-urgent call back, or ask  for a work or school excuse. You will get an email in the next two days asking about your experience. I hope that your e-visit has been valuable and will speed your recovery.  Approximately 5 minutes was spent documenting and reviewing patient's chart.

## 2022-09-09 ENCOUNTER — Other Ambulatory Visit (HOSPITAL_BASED_OUTPATIENT_CLINIC_OR_DEPARTMENT_OTHER): Payer: Self-pay

## 2022-09-24 ENCOUNTER — Other Ambulatory Visit (HOSPITAL_BASED_OUTPATIENT_CLINIC_OR_DEPARTMENT_OTHER): Payer: Self-pay

## 2022-09-24 DIAGNOSIS — Z124 Encounter for screening for malignant neoplasm of cervix: Secondary | ICD-10-CM | POA: Diagnosis not present

## 2022-09-24 DIAGNOSIS — Z1231 Encounter for screening mammogram for malignant neoplasm of breast: Secondary | ICD-10-CM | POA: Diagnosis not present

## 2022-09-24 DIAGNOSIS — N959 Unspecified menopausal and perimenopausal disorder: Secondary | ICD-10-CM | POA: Diagnosis not present

## 2022-09-24 DIAGNOSIS — N952 Postmenopausal atrophic vaginitis: Secondary | ICD-10-CM | POA: Diagnosis not present

## 2022-09-24 DIAGNOSIS — Z6830 Body mass index (BMI) 30.0-30.9, adult: Secondary | ICD-10-CM | POA: Diagnosis not present

## 2022-09-24 MED ORDER — ESTRADIOL 0.025 MG/24HR TD PTTW
1.0000 | MEDICATED_PATCH | TRANSDERMAL | 3 refills | Status: DC
  Filled 2022-09-24 – 2022-10-13 (×2): qty 24, 84d supply, fill #0
  Filled 2023-01-06: qty 24, 84d supply, fill #1
  Filled 2023-04-03: qty 24, 84d supply, fill #2
  Filled 2023-06-23: qty 24, 84d supply, fill #3

## 2022-09-27 DIAGNOSIS — J309 Allergic rhinitis, unspecified: Secondary | ICD-10-CM | POA: Diagnosis not present

## 2022-09-27 DIAGNOSIS — G2581 Restless legs syndrome: Secondary | ICD-10-CM | POA: Diagnosis not present

## 2022-09-27 DIAGNOSIS — E669 Obesity, unspecified: Secondary | ICD-10-CM | POA: Diagnosis not present

## 2022-09-27 DIAGNOSIS — K219 Gastro-esophageal reflux disease without esophagitis: Secondary | ICD-10-CM | POA: Diagnosis not present

## 2022-09-27 DIAGNOSIS — G8929 Other chronic pain: Secondary | ICD-10-CM | POA: Diagnosis not present

## 2022-09-27 DIAGNOSIS — R42 Dizziness and giddiness: Secondary | ICD-10-CM | POA: Diagnosis not present

## 2022-09-27 DIAGNOSIS — N952 Postmenopausal atrophic vaginitis: Secondary | ICD-10-CM | POA: Diagnosis not present

## 2022-10-05 ENCOUNTER — Ambulatory Visit: Payer: 59 | Admitting: Physician Assistant

## 2022-10-06 DIAGNOSIS — C44399 Other specified malignant neoplasm of skin of other parts of face: Secondary | ICD-10-CM | POA: Diagnosis not present

## 2022-10-14 ENCOUNTER — Other Ambulatory Visit (HOSPITAL_BASED_OUTPATIENT_CLINIC_OR_DEPARTMENT_OTHER): Payer: Self-pay

## 2022-11-18 DIAGNOSIS — H43393 Other vitreous opacities, bilateral: Secondary | ICD-10-CM | POA: Diagnosis not present

## 2022-12-22 ENCOUNTER — Other Ambulatory Visit: Payer: Self-pay | Admitting: Physician Assistant

## 2022-12-22 DIAGNOSIS — K429 Umbilical hernia without obstruction or gangrene: Secondary | ICD-10-CM | POA: Diagnosis not present

## 2022-12-29 ENCOUNTER — Ambulatory Visit
Admission: RE | Admit: 2022-12-29 | Discharge: 2022-12-29 | Disposition: A | Discharge status: Home / Self Care | Payer: PPO | Source: Ambulatory Visit | Attending: Physician Assistant | Admitting: Physician Assistant

## 2022-12-29 DIAGNOSIS — K429 Umbilical hernia without obstruction or gangrene: Secondary | ICD-10-CM

## 2022-12-29 DIAGNOSIS — R1905 Periumbilic swelling, mass or lump: Secondary | ICD-10-CM | POA: Diagnosis not present

## 2023-01-07 ENCOUNTER — Other Ambulatory Visit (HOSPITAL_BASED_OUTPATIENT_CLINIC_OR_DEPARTMENT_OTHER): Payer: Self-pay

## 2023-02-03 ENCOUNTER — Other Ambulatory Visit: Payer: Self-pay | Admitting: General Surgery

## 2023-02-03 DIAGNOSIS — R1905 Periumbilic swelling, mass or lump: Secondary | ICD-10-CM | POA: Diagnosis not present

## 2023-02-14 ENCOUNTER — Other Ambulatory Visit (HOSPITAL_BASED_OUTPATIENT_CLINIC_OR_DEPARTMENT_OTHER): Payer: Self-pay

## 2023-02-14 DIAGNOSIS — Z0000 Encounter for general adult medical examination without abnormal findings: Secondary | ICD-10-CM | POA: Diagnosis not present

## 2023-02-14 DIAGNOSIS — E7800 Pure hypercholesterolemia, unspecified: Secondary | ICD-10-CM | POA: Diagnosis not present

## 2023-02-14 DIAGNOSIS — G2581 Restless legs syndrome: Secondary | ICD-10-CM | POA: Diagnosis not present

## 2023-02-14 DIAGNOSIS — Z23 Encounter for immunization: Secondary | ICD-10-CM | POA: Diagnosis not present

## 2023-02-14 MED ORDER — CYCLOBENZAPRINE HCL 5 MG PO TABS
5.0000 mg | ORAL_TABLET | Freq: Every evening | ORAL | 5 refills | Status: AC | PRN
  Filled 2023-02-14 – 2023-06-27 (×2): qty 30, 30d supply, fill #0
  Filled 2023-06-27: qty 30, 30d supply, fill #1
  Filled 2023-06-27 – 2023-07-24 (×2): qty 30, 30d supply, fill #0
  Filled 2023-11-17: qty 30, 30d supply, fill #1

## 2023-02-14 MED ORDER — MECLIZINE HCL 12.5 MG PO TABS
6.2500 mg | ORAL_TABLET | Freq: Two times a day (BID) | ORAL | 5 refills | Status: AC | PRN
  Filled 2023-02-14: qty 30, 15d supply, fill #0

## 2023-02-15 ENCOUNTER — Other Ambulatory Visit (HOSPITAL_BASED_OUTPATIENT_CLINIC_OR_DEPARTMENT_OTHER): Payer: Self-pay

## 2023-02-16 ENCOUNTER — Other Ambulatory Visit (HOSPITAL_BASED_OUTPATIENT_CLINIC_OR_DEPARTMENT_OTHER): Payer: Self-pay

## 2023-02-16 MED ORDER — ROPINIROLE HCL 1 MG PO TABS
1.0000 mg | ORAL_TABLET | Freq: Every evening | ORAL | 3 refills | Status: DC | PRN
  Filled 2023-02-25: qty 90, 90d supply, fill #0
  Filled 2023-05-26: qty 90, 90d supply, fill #1
  Filled 2023-09-05: qty 90, 90d supply, fill #2
  Filled 2023-12-05: qty 90, 90d supply, fill #3

## 2023-02-25 ENCOUNTER — Other Ambulatory Visit (HOSPITAL_BASED_OUTPATIENT_CLINIC_OR_DEPARTMENT_OTHER): Payer: Self-pay

## 2023-03-09 ENCOUNTER — Ambulatory Visit
Admission: RE | Admit: 2023-03-09 | Discharge: 2023-03-09 | Disposition: A | Discharge status: Home / Self Care | Payer: PPO | Source: Ambulatory Visit | Attending: General Surgery | Admitting: General Surgery

## 2023-03-09 DIAGNOSIS — R1905 Periumbilic swelling, mass or lump: Secondary | ICD-10-CM

## 2023-03-09 DIAGNOSIS — R1909 Other intra-abdominal and pelvic swelling, mass and lump: Secondary | ICD-10-CM | POA: Diagnosis not present

## 2023-03-22 ENCOUNTER — Other Ambulatory Visit (INDEPENDENT_AMBULATORY_CARE_PROVIDER_SITE_OTHER): Payer: Self-pay

## 2023-03-22 ENCOUNTER — Ambulatory Visit: Payer: PPO | Admitting: Orthopaedic Surgery

## 2023-03-22 DIAGNOSIS — M1712 Unilateral primary osteoarthritis, left knee: Secondary | ICD-10-CM

## 2023-03-22 NOTE — Progress Notes (Signed)
Office Visit Note   Patient: Kendra Le           Date of Birth: [birth year]           MRN: [id number] Visit Date: 03/22/2023              Requested by: Tarry Kos, MD [address] [place],  [place] [address] PCP: Noberto Retort, MD   Assessment & Plan: Visit Diagnoses:  1. Primary osteoarthritis of left knee     Plan: Kendra Le is a 67 year old female with advanced tricompartmental DJD with bone-on-bone changes in the patellofemoral compartment.  I suspect she has high-grade chondromalacia in the femoral-tibial compartments as well.  Treatment options were again discussed in detail and I have recommended total knee replacement as the most reliable and durable means of pain relief and improvement in quality of life and function.  She will think about her options and call us back when she is ready to schedule surgery.  She will hold off on any injections for now.  Follow-Up Instructions: No follow-ups on file.   Orders:  Orders Placed This Encounter  Procedures   XR KNEE 3 VIEW LEFT   No orders of the defined types were placed in this encounter.     Procedures: No procedures performed   Clinical Data: No additional findings.   Subjective: Chief Complaint  Patient presents with   Left Knee - Pain    HPI Kendra Le is a 67 year old female comes back today for follow-up for left knee pain that has gotten much worse in the last 3 weeks.  She is experiencing giving way and popping.  She is complaining of instability.  Review of Systems  Constitutional: Negative.   HENT: Negative.    Eyes: Negative.   Respiratory: Negative.    Cardiovascular: Negative.   Endocrine: Negative.   Musculoskeletal: Negative.   Neurological: Negative.   Hematological: Negative.   Psychiatric/Behavioral: Negative.    All other systems reviewed and are negative.    Objective: Vital Signs: There were no vitals taken for this visit.  Physical Exam Vitals and nursing note  reviewed.  Constitutional:      Appearance: She is well-developed.  HENT:     Head: Normocephalic and atraumatic.  Pulmonary:     Effort: Pulmonary effort is normal.  Abdominal:     Palpations: Abdomen is soft.  Musculoskeletal:     Cervical back: Neck supple.  Skin:    General: Skin is warm.     Capillary Refill: Capillary refill takes less than 2 seconds.  Neurological:     Mental Status: She is alert and oriented to person, place, and time.  Psychiatric:        Behavior: Behavior normal.        Thought Content: Thought content normal.        Judgment: Judgment normal.    Ortho Exam Exam of the left knee shows no joint effusion.  Collaterals and cruciates are stable.  Pain and crepitus with range of motion. Specialty Comments:  No specialty comments available.  Imaging: No results found.   PMFS History: Patient Active Problem List   Diagnosis Date Noted   Primary osteoarthritis of right knee 09/01/2021   Primary osteoarthritis of left knee 09/01/2021   Ingrown toenail 12/21/2019   Left hip pain 09/04/2017   Right shoulder pain 09/04/2017   Arthralgia of right temporomandibular joint 11/16/2016   Sensorineural hearing loss (SNHL) of both ears 11/16/2016   Tinnitus of  left ear 11/16/2016   Synovial cyst 08/07/2015   Trochanteric bursitis of both hips 10/22/2014   Plantar fasciitis, left 12/24/2013   Left knee pain 10/15/2011   Past Medical History:  Diagnosis Date   Atypical nevus 05/23/2000   Left Upper Arm-Mild   Atypical nevus 05/25/2006   Right Calf-Moderate   Atypical nevus 01/23/2010   Left Upper Inner Arm-Mild   Atypical nevus 08/06/2015   Left Forearm-Mild   BCC (basal cell carcinoma of skin) 03/31/2004   Right Lower Back (tx p bx)   GERD (gastroesophageal reflux disease)    Hiatal hernia    Migraine    Superficial basal cell carcinoma (BCC) 10/15/1999   Lower Post Neck, Left Mid Back,Left Lower Back and Upper Sternum (all Cx3,5FU)   Superficial  basal cell carcinoma (BCC) 03/01/2014   Left Post Shoulder (Cx3,5FU)   Superficial basal cell carcinoma (BCC) 06/26/2014   Right Shoulder (Cx3,5FU)   Superficial basal cell carcinoma (BCC) 06/28/2017   Sup Upper Sternum (tx p bx)    Family History  Problem Relation Age of Onset   Heart attack Mother    Hyperlipidemia Mother    Diabetes Mother    Heart attack Father    Hyperlipidemia Father    Hypertension Father    Sudden death Neg Hx     Past Surgical History:  Procedure Laterality Date   ABDOMINAL HYSTERECTOMY     ANTERIOR CRUCIATE LIGAMENT REPAIR     left   BREAST SURGERY     CHOLECYSTECTOMY     Social History   Occupational History   Not on file  Tobacco Use   Smoking status: Never   Smokeless tobacco: Never  Substance and Sexual Activity   Alcohol use: No    Alcohol/week: 0.0 standard drinks of alcohol   Drug use: No   Sexual activity: Not on file

## 2023-04-04 ENCOUNTER — Other Ambulatory Visit: Payer: Self-pay

## 2023-06-06 ENCOUNTER — Other Ambulatory Visit (HOSPITAL_COMMUNITY): Payer: Self-pay

## 2023-06-06 ENCOUNTER — Other Ambulatory Visit (HOSPITAL_BASED_OUTPATIENT_CLINIC_OR_DEPARTMENT_OTHER): Payer: Self-pay

## 2023-06-06 ENCOUNTER — Other Ambulatory Visit: Payer: Self-pay | Admitting: Physician Assistant

## 2023-06-06 MED ORDER — RIVAROXABAN 10 MG PO TABS
10.0000 mg | ORAL_TABLET | Freq: Every day | ORAL | 0 refills | Status: AC
  Filled 2023-06-06: qty 30, 30d supply, fill #0

## 2023-06-06 MED ORDER — METHOCARBAMOL 750 MG PO TABS
750.0000 mg | ORAL_TABLET | Freq: Two times a day (BID) | ORAL | 2 refills | Status: DC | PRN
  Filled 2023-06-06 – 2023-06-27 (×2): qty 20, 10d supply, fill #0
  Filled 2023-06-27: qty 20, 10d supply, fill #1

## 2023-06-06 MED ORDER — OXYCODONE-ACETAMINOPHEN 5-325 MG PO TABS
1.0000 | ORAL_TABLET | Freq: Four times a day (QID) | ORAL | 0 refills | Status: AC | PRN
Start: 2023-06-06 — End: ?
  Filled 2023-06-06: qty 40, 5d supply, fill #0

## 2023-06-06 MED ORDER — DOCUSATE SODIUM 100 MG PO CAPS
100.0000 mg | ORAL_CAPSULE | Freq: Every day | ORAL | 2 refills | Status: AC | PRN
  Filled 2023-06-06: qty 30, 30d supply, fill #0
  Filled 2023-06-27: qty 30, 30d supply, fill #1
  Filled 2023-06-27: qty 30, 30d supply, fill #0

## 2023-06-06 MED ORDER — ONDANSETRON HCL 4 MG PO TABS
4.0000 mg | ORAL_TABLET | Freq: Three times a day (TID) | ORAL | 0 refills | Status: AC | PRN
  Filled 2023-06-06: qty 40, 14d supply, fill #0

## 2023-06-06 NOTE — Progress Notes (Signed)
Surgical Instructions   Your procedure is scheduled on Monday, December 23, 24. Report to [facility] Main Entrance "A" at 8:00 A.M., then check in with the Admitting office. Any questions or running late day of surgery: call [phone]  Questions prior to your surgery date: call [phone], Monday-Friday, 8am-4pm. If you experience any cold or flu symptoms such as cough, fever, chills, shortness of breath, etc. between now and your scheduled surgery, please notify us at the above number.     Remember:  Do not eat after midnight the night before your surgery  You may drink clear liquids until 7:00 the morning of your surgery.   Clear liquids allowed are: Water, Non-Citrus Juices (without pulp), Carbonated Beverages, Clear Tea (no milk, honey, etc.), Black Coffee Only (NO MILK, CREAM OR POWDERED CREAMER of any kind), and Gatorade.    Take these medicines the morning of surgery with A SIP OF WATER   cetirizine (ZYRTEC)   May take these medicines IF NEEDED:  cyclobenzaprine (FLEXERIL)  docusate sodium (COLACE)  meclizine (ANTIVERT)  methocarbamol (ROBAXIN-750)  ondansetron (ZOFRAN)  rOPINIRole (REQUIP)   Follow your surgeon's instructions on stopping your rivaroxaban (XARELTO).  Do not take your Xarelto for 3 days prior to your surgery.  Your last dose should be on December 19, 24.    One week prior to surgery, STOP taking any Aspirin (unless otherwise instructed by your surgeon) Aleve, Naproxen, Ibuprofen, Motrin, Advil, Goody's, BC's, all herbal medications, fish oil, and non-prescription vitamins.                     Do NOT Smoke (Tobacco/Vaping) for 24 hours prior to your procedure.  If you use a CPAP at night, you may bring your mask/headgear for your overnight stay.   You will be asked to remove any contacts, glasses, piercing's, hearing aid's, dentures/partials prior to surgery. Please bring cases for these items if needed.    Patients discharged the day of surgery  will not be allowed to drive home, and someone needs to stay with them for 24 hours.  SURGICAL WAITING ROOM VISITATION Patients may have no more than 2 support people in the waiting area - these visitors may rotate.   Pre-op nurse will coordinate an appropriate time for 1 ADULT support person, who may not rotate, to accompany patient in pre-op.  Children under the age of 2 must have an adult with them who is not the patient and must remain in the main waiting area with an adult.  If the patient needs to stay at the hospital during part of their recovery, the visitor guidelines for inpatient rooms apply.  Please refer to the [facility] website for the visitor guidelines for any additional information.   If you received a COVID test during your pre-op visit  it is requested that you wear a mask when out in public, stay away from anyone that may not be feeling well and notify your surgeon if you develop symptoms. If you have been in contact with anyone that has tested positive in the last 10 days please notify you surgeon.      Pre-operative 5 CHG Bathing Instructions   You can play a key role in reducing the risk of infection after surgery. Your skin needs to be as free of germs as possible. You can reduce the number of germs on your skin by washing with CHG (chlorhexidine gluconate) soap before surgery. CHG is an antiseptic soap that kills germs and continues  to kill germs even after washing.   DO NOT use if you have an allergy to chlorhexidine/CHG or antibacterial soaps. If your skin becomes reddened or irritated, stop using the CHG and notify one of our RNs at [phone].   Please shower with the CHG soap starting 4 days before surgery using the following schedule:     Please keep in mind the following:  DO NOT shave, including legs and underarms, starting the day of your first shower.   You may shave your face at any point before/day of surgery.  Place clean sheets on your bed the  day you start using CHG soap. Use a clean washcloth (not used since being washed) for each shower. DO NOT sleep with pets once you start using the CHG.   CHG Shower Instructions:  Wash your face and private area with normal soap. If you choose to wash your hair, wash first with your normal shampoo.  After you use shampoo/soap, rinse your hair and body thoroughly to remove shampoo/soap residue.  Turn the water OFF and apply about 3 tablespoons (45 ml) of CHG soap to a CLEAN washcloth.  Apply CHG soap ONLY FROM YOUR NECK DOWN TO YOUR TOES (washing for 3-5 minutes)  DO NOT use CHG soap on face, private areas, open wounds, or sores.  Pay special attention to the area where your surgery is being performed.  If you are having back surgery, having someone wash your back for you may be helpful. Wait 2 minutes after CHG soap is applied, then you may rinse off the CHG soap.  Pat dry with a clean towel  Put on clean clothes/pajamas   If you choose to wear lotion, please use ONLY the CHG-compatible lotions on the back of this paper.   Additional instructions for the day of surgery: DO NOT APPLY any lotions, deodorants, cologne, or perfumes.   Do not bring valuables to the hospital. [facility] is not responsible for any belongings/valuables. Do not wear nail polish, gel polish, artificial nails, or any other type of covering on natural nails (fingers and toes) Do not wear jewelry or makeup Put on clean/comfortable clothes.  Please brush your teeth.  Ask your nurse before applying any prescription medications to the skin.     CHG Compatible Lotions   Aveeno Moisturizing lotion  Cetaphil Moisturizing Cream  Cetaphil Moisturizing Lotion  Clairol Herbal Essence Moisturizing Lotion, Dry Skin  Clairol Herbal Essence Moisturizing Lotion, Extra Dry Skin  Clairol Herbal Essence Moisturizing Lotion, Normal Skin  Curel Age Defying Therapeutic Moisturizing Lotion with Alpha Hydroxy  Curel Extreme Care  Body Lotion  Curel Soothing Hands Moisturizing Hand Lotion  Curel Therapeutic Moisturizing Cream, Fragrance-Free  Curel Therapeutic Moisturizing Lotion, Fragrance-Free  Curel Therapeutic Moisturizing Lotion, Original Formula  Eucerin Daily Replenishing Lotion  Eucerin Dry Skin Therapy Plus Alpha Hydroxy Crme  Eucerin Dry Skin Therapy Plus Alpha Hydroxy Lotion  Eucerin Original Crme  Eucerin Original Lotion  Eucerin Plus Crme Eucerin Plus Lotion  Eucerin TriLipid Replenishing Lotion  Keri Anti-Bacterial Hand Lotion  Keri Deep Conditioning Original Lotion Dry Skin Formula Softly Scented  Keri Deep Conditioning Original Lotion, Fragrance Free Sensitive Skin Formula  Keri Lotion Fast Absorbing Fragrance Free Sensitive Skin Formula  Keri Lotion Fast Absorbing Softly Scented Dry Skin Formula  Keri Original Lotion  Keri Skin Renewal Lotion Keri Silky Smooth Lotion  Keri Silky Smooth Sensitive Skin Lotion  Nivea Body Creamy Conditioning Oil  Nivea Body Extra Enriched Education administrator Original  Lotion  Nivea Body Sheer Moisturizing Lotion Nivea Crme  Nivea Skin Firming Lotion  NutraDerm 30 Skin Lotion  NutraDerm Skin Lotion  NutraDerm Therapeutic Skin Cream  NutraDerm Therapeutic Skin Lotion  ProShield Protective Hand Cream  Provon moisturizing lotion  Please read over the following fact sheets that you were given.

## 2023-06-07 ENCOUNTER — Other Ambulatory Visit: Payer: Self-pay

## 2023-06-07 ENCOUNTER — Telehealth: Payer: Self-pay | Admitting: *Deleted

## 2023-06-07 ENCOUNTER — Other Ambulatory Visit: Payer: Self-pay | Admitting: *Deleted

## 2023-06-07 ENCOUNTER — Encounter (HOSPITAL_COMMUNITY)
Admission: RE | Admit: 2023-06-07 | Discharge: 2023-06-07 | Disposition: A | Discharge status: Home / Self Care | Payer: PPO | Source: Ambulatory Visit | Attending: Orthopaedic Surgery | Admitting: Orthopaedic Surgery

## 2023-06-07 ENCOUNTER — Encounter (HOSPITAL_COMMUNITY): Payer: Self-pay

## 2023-06-07 VITALS — BP 136/67 | HR 72 | Temp 98.3°F | Resp 18 | Ht 62.0 in | Wt 169.7 lb

## 2023-06-07 DIAGNOSIS — Z9049 Acquired absence of other specified parts of digestive tract: Secondary | ICD-10-CM | POA: Diagnosis not present

## 2023-06-07 DIAGNOSIS — Z90710 Acquired absence of both cervix and uterus: Secondary | ICD-10-CM | POA: Diagnosis not present

## 2023-06-07 DIAGNOSIS — Z85828 Personal history of other malignant neoplasm of skin: Secondary | ICD-10-CM | POA: Diagnosis not present

## 2023-06-07 DIAGNOSIS — Z8669 Personal history of other diseases of the nervous system and sense organs: Secondary | ICD-10-CM | POA: Insufficient documentation

## 2023-06-07 DIAGNOSIS — M1712 Unilateral primary osteoarthritis, left knee: Secondary | ICD-10-CM

## 2023-06-07 DIAGNOSIS — Z01812 Encounter for preprocedural laboratory examination: Secondary | ICD-10-CM | POA: Diagnosis not present

## 2023-06-07 DIAGNOSIS — Z01818 Encounter for other preprocedural examination: Secondary | ICD-10-CM

## 2023-06-07 DIAGNOSIS — K219 Gastro-esophageal reflux disease without esophagitis: Secondary | ICD-10-CM | POA: Diagnosis not present

## 2023-06-07 HISTORY — DX: Other specified postprocedural states: Z98.890

## 2023-06-07 HISTORY — DX: Unspecified osteoarthritis, unspecified site: M19.90

## 2023-06-07 LAB — CBC
HCT: 40.3 % (ref 36.0–46.0)
Hemoglobin: 12.7 g/dL (ref 12.0–15.0)
MCH: 28.7 pg (ref 26.0–34.0)
MCHC: 31.5 g/dL (ref 30.0–36.0)
MCV: 91 fL (ref 80.0–100.0)
Platelets: 441 10*3/uL — ABNORMAL HIGH (ref 150–400)
RBC: 4.43 MIL/uL (ref 3.87–5.11)
RDW: 13.8 % (ref 11.5–15.5)
WBC: 7.3 10*3/uL (ref 4.0–10.5)
nRBC: 0 % (ref 0.0–0.2)

## 2023-06-07 LAB — BASIC METABOLIC PANEL
Anion gap: 8 (ref 5–15)
BUN: 14 mg/dL (ref 8–23)
CO2: 25 mmol/L (ref 22–32)
Calcium: 9.5 mg/dL (ref 8.9–10.3)
Chloride: 104 mmol/L (ref 98–111)
Creatinine, Ser: 0.83 mg/dL (ref 0.44–1.00)
GFR, Estimated: >60 mL/min (ref >60)
Glucose, Bld: 96 mg/dL (ref 70–99)
Potassium: 3.9 mmol/L (ref 3.5–5.1)
Sodium: 137 mmol/L (ref 135–145)

## 2023-06-07 LAB — SURGICAL PCR SCREEN
MRSA, PCR: NEGATIVE
Staphylococcus aureus: NEGATIVE

## 2023-06-07 NOTE — Telephone Encounter (Signed)
Pre-op call completed

## 2023-06-07 NOTE — Progress Notes (Signed)
PCP - Johny Blamer, MD Cardiologist - denies  PPM/ICD - denies Device Orders - n/a Rep Notified - n/a  Chest x-ray - denies EKG - 2024; requested from PCP Stress Test - 10/09/1999 ECHO - denies Cardiac Cath - denies  Sleep Study - denies CPAP - n/a  Fasting Blood Sugar - no DM Checks Blood Sugar _____ times a day  Last dose of GLP1 agonist- n/a GLP1 instructions: n/a  Blood Thinner Instructions: n/a Aspirin Instructions: n/a  ERAS Protcol - yes, till 7 am PRE-SURGERY Ensure or G2- Ensure  COVID TEST- n/a   Anesthesia review: yes; EKG tracing is requested from PCP. Per pt she experienced  palpitations 10 years ago and she has been seen by cardiologist Dr Anne Fu. No follow up was needed. Patient denies shortness of breath, fever, cough and chest pain at PAT appointment   All instructions explained to the patient, with a verbal understanding of the material. Patient agrees to go over the instructions while at home for a better understanding. Patient also instructed to self quarantine after being tested for COVID-19. The opportunity to ask questions was provided.

## 2023-06-07 NOTE — Progress Notes (Addendum)
Surgical Instructions     Your procedure is scheduled on Monday, December 23, 24.  Report to [facility] Main Entrance "A" at 0730 A.M., then check in with the Admitting office. Any questions or running late day of surgery: call [phone]   Questions prior to your surgery date: call [phone], Monday-Friday, 8am-4pm. If you experience any cold or flu symptoms such as cough, fever, chills, shortness of breath, etc. between now and your scheduled surgery, please notify us at the above number.            Remember:       Do not eat after midnight the night before your surgery   You may drink clear liquids until 7:00 the morning of your surgery.   Clear liquids allowed are: Water, Non-Citrus Juices (without pulp), Carbonated Beverages, Clear Tea (no milk, honey, etc.), Black Coffee Only (NO MILK, CREAM OR POWDERED CREAMER of any kind), and Gatorade.  Patient Instructions  The night before surgery:  No food after midnight. ONLY clear liquids after midnight  The day of surgery (if you do NOT have diabetes):  Drink ONE (1) Pre-Surgery Clear Ensure by 0700 the morning of surgery. Drink in one sitting. Do not sip.  This drink was given to you during your hospital  pre-op appointment visit.  Nothing else to drink after completing the  Pre-Surgery Clear Ensure.            Take these medicines the morning of surgery with A SIP OF WATER    cetirizine (ZYRTEC)    May take these medicines IF NEEDED:   cyclobenzaprine (FLEXERIL)  docusate sodium (COLACE)  meclizine (ANTIVERT)  methocarbamol (ROBAXIN-750)  ondansetron (ZOFRAN)     One week prior to surgery, STOP taking any Aspirin (unless otherwise instructed by your surgeon) Aleve, Naproxen, Ibuprofen, Motrin, Advil, Goody's, BC's, all herbal medications, fish oil, and non-prescription vitamins.                     Do NOT Smoke (Tobacco/Vaping) for 24 hours prior to your procedure.   If you use a CPAP at night, you may bring  your mask/headgear for your overnight stay.   You will be asked to remove any contacts, glasses, piercing's, hearing aid's, dentures/partials prior to surgery. Please bring cases for these items if needed.    Patients discharged the day of surgery will not be allowed to drive home, and someone needs to stay with them for 24 hours.   SURGICAL WAITING ROOM VISITATION Patients may have no more than 2 support people in the waiting area - these visitors may rotate.   Pre-op nurse will coordinate an appropriate time for 1 ADULT support person, who may not rotate, to accompany patient in pre-op.  Children under the age of 29 must have an adult with them who is not the patient and must remain in the main waiting area with an adult.   If the patient needs to stay at the hospital during part of their recovery, the visitor guidelines for inpatient rooms apply.   Please refer to the [facility] website for the visitor guidelines for any additional information.     If you received a COVID test during your pre-op visit  it is requested that you wear a mask when out in public, stay away from anyone that may not be feeling well and notify your surgeon if you develop symptoms. If you have been in contact with anyone that has tested positive in the  last 10 days please notify you surgeon.         Pre-operative 5 CHG Bathing Instructions    You can play a key role in reducing the risk of infection after surgery. Your skin needs to be as free of germs as possible. You can reduce the number of germs on your skin by washing with CHG (chlorhexidine gluconate) soap before surgery. CHG is an antiseptic soap that kills germs and continues to kill germs even after washing.    DO NOT use if you have an allergy to chlorhexidine/CHG or antibacterial soaps. If your skin becomes reddened or irritated, stop using the CHG and notify one of our RNs at [phone].    Please shower with the CHG soap starting 4 days before  surgery using the following schedule:       Please keep in mind the following:  DO NOT shave, including legs and underarms, starting the day of your first shower.   You may shave your face at any point before/day of surgery.  Place clean sheets on your bed the day you start using CHG soap. Use a clean washcloth (not used since being washed) for each shower. DO NOT sleep with pets once you start using the CHG.    CHG Shower Instructions:  Wash your face and private area with normal soap. If you choose to wash your hair, wash first with your normal shampoo.  After you use shampoo/soap, rinse your hair and body thoroughly to remove shampoo/soap residue.  Turn the water OFF and apply about 3 tablespoons (45 ml) of CHG soap to a CLEAN washcloth.  Apply CHG soap ONLY FROM YOUR NECK DOWN TO YOUR TOES (washing for 3-5 minutes)  DO NOT use CHG soap on face, private areas, open wounds, or sores.  Pay special attention to the area where your surgery is being performed.  If you are having back surgery, having someone wash your back for you may be helpful. Wait 2 minutes after CHG soap is applied, then you may rinse off the CHG soap.  Pat dry with a clean towel  Put on clean clothes/pajamas   If you choose to wear lotion, please use ONLY the CHG-compatible lotions on the back of this paper.    Additional instructions for the day of surgery: DO NOT APPLY any lotions, deodorants, cologne, or perfumes.   Do not bring valuables to the hospital. [facility] is not responsible for any belongings/valuables. Do not wear nail polish, gel polish, artificial nails, or any other type of covering on natural nails (fingers and toes) Do not wear jewelry or makeup Put on clean/comfortable clothes.  Please brush your teeth.  Ask your nurse before applying any prescription medications to the skin.        CHG Compatible Lotions    Aveeno Moisturizing lotion  Cetaphil Moisturizing Cream  Cetaphil Moisturizing  Lotion  Clairol Herbal Essence Moisturizing Lotion, Dry Skin  Clairol Herbal Essence Moisturizing Lotion, Extra Dry Skin  Clairol Herbal Essence Moisturizing Lotion, Normal Skin  Curel Age Defying Therapeutic Moisturizing Lotion with Alpha Hydroxy  Curel Extreme Care Body Lotion  Curel Soothing Hands Moisturizing Hand Lotion  Curel Therapeutic Moisturizing Cream, Fragrance-Free  Curel Therapeutic Moisturizing Lotion, Fragrance-Free  Curel Therapeutic Moisturizing Lotion, Original Formula  Eucerin Daily Replenishing Lotion  Eucerin Dry Skin Therapy Plus Alpha Hydroxy Crme  Eucerin Dry Skin Therapy Plus Alpha Hydroxy Lotion  Eucerin Original Crme  Eucerin Original Lotion  Eucerin Plus Crme Eucerin Plus Lotion  Eucerin TriLipid Replenishing Lotion  Keri Anti-Bacterial Hand Lotion  Keri Deep Conditioning Original Lotion Dry Skin Formula Softly Scented  Keri Deep Conditioning Original Lotion, Fragrance Free Sensitive Skin Formula  Keri Lotion Fast Absorbing Fragrance Free Sensitive Skin Formula  Keri Lotion Fast Absorbing Softly Scented Dry Skin Formula  Keri Original Lotion  Keri Skin Renewal Lotion Keri Silky Smooth Lotion  Keri Silky Smooth Sensitive Skin Lotion  Nivea Body Creamy Conditioning Oil  Nivea Body Extra Enriched Lotion  Nivea Body Original Lotion  Nivea Body Sheer Moisturizing Lotion Nivea Crme  Nivea Skin Firming Lotion  NutraDerm 30 Skin Lotion  NutraDerm Skin Lotion  NutraDerm Therapeutic Skin Cream  NutraDerm Therapeutic Skin Lotion  ProShield Protective Hand Cream  Provon moisturizing lotion   Please read over the following fact sheets that you were given.

## 2023-06-07 NOTE — Pre-Procedure Instructions (Addendum)
Surgical Instructions     Your procedure is scheduled on Monday, June 13, 2023. Report to [facility] Main Entrance "A" at 0730 A.M., then check in with the Admitting office. Any questions or running late day of surgery: call [phone]   Questions prior to your surgery date: call [phone], Monday-Friday, 8am-4pm. If you experience any cold or flu symptoms such as cough, fever, chills, shortness of breath, etc. between now and your scheduled surgery, please notify us at the above number.            Remember:       Do not eat after midnight the night before your surgery   You may drink clear liquids until 7:00 the morning of your surgery.   Clear liquids allowed are: Water, Non-Citrus Juices (without pulp), Carbonated Beverages, Clear Tea (no milk, honey, etc.), Black Coffee Only (NO MILK, CREAM OR POWDERED CREAMER of any kind), and Gatorade.  Patient Instructions  The night before surgery:  No food after midnight. ONLY clear liquids after midnight  The day of surgery (if you do NOT have diabetes):  Drink ONE (1) Pre-Surgery Clear Ensure by 0700  the morning of surgery. Drink in one sitting. Do not sip.  This drink was given to you during your hospital  pre-op appointment visit.  Nothing else to drink after completing the  Pre-Surgery Clear Ensure.         Take these medicines the morning of surgery with A SIP OF WATER    cetirizine (ZYRTEC)    May take these medicines IF NEEDED:   cyclobenzaprine (FLEXERIL)  docusate sodium (COLACE)  meclizine (ANTIVERT)  methocarbamol (ROBAXIN-750)  ondansetron (ZOFRAN)      One week prior to surgery, STOP taking any Aspirin (unless otherwise instructed by your surgeon) Aleve, Naproxen, Ibuprofen, Motrin, Advil, Goody's, BC's, all herbal medications, fish oil, and non-prescription vitamins.                     Do NOT Smoke (Tobacco/Vaping) for 24 hours prior to your procedure.   If you use a CPAP at night, you may bring  your mask/headgear for your overnight stay.   You will be asked to remove any contacts, glasses, piercing's, hearing aid's, dentures/partials prior to surgery. Please bring cases for these items if needed.    Patients discharged the day of surgery will not be allowed to drive home, and someone needs to stay with them for 24 hours.   SURGICAL WAITING ROOM VISITATION Patients may have no more than 2 support people in the waiting area - these visitors may rotate.   Pre-op nurse will coordinate an appropriate time for 1 ADULT support person, who may not rotate, to accompany patient in pre-op.  Children under the age of 22 must have an adult with them who is not the patient and must remain in the main waiting area with an adult.   If the patient needs to stay at the hospital during part of their recovery, the visitor guidelines for inpatient rooms apply.   Please refer to the [facility] website for the visitor guidelines for any additional information.     If you received a COVID test during your pre-op visit  it is requested that you wear a mask when out in public, stay away from anyone that may not be feeling well and notify your surgeon if you develop symptoms. If you have been in contact with anyone that has tested positive in the last 10  days please notify you surgeon.         Pre-operative 5 CHG Bathing Instructions    You can play a key role in reducing the risk of infection after surgery. Your skin needs to be as free of germs as possible. You can reduce the number of germs on your skin by washing with CHG (chlorhexidine gluconate) soap before surgery. CHG is an antiseptic soap that kills germs and continues to kill germs even after washing.    DO NOT use if you have an allergy to chlorhexidine/CHG or antibacterial soaps. If your skin becomes reddened or irritated, stop using the CHG and notify one of our RNs at [phone].    Please shower with the CHG soap starting 4 days before  surgery using the following schedule:       Please keep in mind the following:  DO NOT shave, including legs and underarms, starting the day of your first shower.   You may shave your face at any point before/day of surgery.  Place clean sheets on your bed the day you start using CHG soap. Use a clean washcloth (not used since being washed) for each shower. DO NOT sleep with pets once you start using the CHG.    CHG Shower Instructions:  Wash your face and private area with normal soap. If you choose to wash your hair, wash first with your normal shampoo.  After you use shampoo/soap, rinse your hair and body thoroughly to remove shampoo/soap residue.  Turn the water OFF and apply about 3 tablespoons (45 ml) of CHG soap to a CLEAN washcloth.  Apply CHG soap ONLY FROM YOUR NECK DOWN TO YOUR TOES (washing for 3-5 minutes)  DO NOT use CHG soap on face, private areas, open wounds, or sores.  Pay special attention to the area where your surgery is being performed.  If you are having back surgery, having someone wash your back for you may be helpful. Wait 2 minutes after CHG soap is applied, then you may rinse off the CHG soap.  Pat dry with a clean towel  Put on clean clothes/pajamas   If you choose to wear lotion, please use ONLY the CHG-compatible lotions on the back of this paper.    Additional instructions for the day of surgery: DO NOT APPLY any lotions, deodorants, cologne, or perfumes.   Do not bring valuables to the hospital. [facility] is not responsible for any belongings/valuables. Do not wear nail polish, gel polish, artificial nails, or any other type of covering on natural nails (fingers and toes) Do not wear jewelry or makeup Put on clean/comfortable clothes.  Please brush your teeth.  Ask your nurse before applying any prescription medications to the skin.        CHG Compatible Lotions    Aveeno Moisturizing lotion  Cetaphil Moisturizing Cream  Cetaphil Moisturizing  Lotion  Clairol Herbal Essence Moisturizing Lotion, Dry Skin  Clairol Herbal Essence Moisturizing Lotion, Extra Dry Skin  Clairol Herbal Essence Moisturizing Lotion, Normal Skin  Curel Age Defying Therapeutic Moisturizing Lotion with Alpha Hydroxy  Curel Extreme Care Body Lotion  Curel Soothing Hands Moisturizing Hand Lotion  Curel Therapeutic Moisturizing Cream, Fragrance-Free  Curel Therapeutic Moisturizing Lotion, Fragrance-Free  Curel Therapeutic Moisturizing Lotion, Original Formula  Eucerin Daily Replenishing Lotion  Eucerin Dry Skin Therapy Plus Alpha Hydroxy Crme  Eucerin Dry Skin Therapy Plus Alpha Hydroxy Lotion  Eucerin Original Crme  Eucerin Original Lotion  Eucerin Plus Crme Eucerin Plus Lotion  Eucerin TriLipid  Replenishing Lotion  Keri Anti-Bacterial Hand Lotion  Keri Deep Conditioning Original Lotion Dry Skin Formula Softly Scented  Keri Deep Conditioning Original Lotion, Fragrance Free Sensitive Skin Formula  Keri Lotion Fast Absorbing Fragrance Free Sensitive Skin Formula  Keri Lotion Fast Absorbing Softly Scented Dry Skin Formula  Keri Original Lotion  Keri Skin Renewal Lotion Keri Silky Smooth Lotion  Keri Silky Smooth Sensitive Skin Lotion  Nivea Body Creamy Conditioning Oil  Nivea Body Extra Enriched Lotion  Nivea Body Original Lotion  Nivea Body Sheer Moisturizing Lotion Nivea Crme  Nivea Skin Firming Lotion  NutraDerm 30 Skin Lotion  NutraDerm Skin Lotion  NutraDerm Therapeutic Skin Cream  NutraDerm Therapeutic Skin Lotion  ProShield Protective Hand Cream  Provon moisturizing lotion   Please read over the following fact sheets that you were given.

## 2023-06-07 NOTE — Care Plan (Signed)
Ortho Bundle Case Management Note  Patient Details  Name: Kendra Le MRN: [id number] Date of Birth: April 18, 1956  [facility] RNCM call to patient to discuss her upcoming Left total knee arthroplasty with Dr. Roda Shutters on 06/13/23 at [facility]. She is agreeable to case management. She anticipates discharge home with assistance from her husband. She will need a RW and CPM. These have been ordered through [employer] to be delivered to her home prior to surgery. Anticipate HHPT after short hospital stay. Referral made to [facility] after choice provided. Reviewed all post op care instructions and questions answered. Will continue to follow for needs.               Additional Comments: Please contact me with any questions of if this plan should need to change.    06/07/2023, 5:09 PM

## 2023-06-08 NOTE — Anesthesia Preprocedure Evaluation (Addendum)
Anesthesia Evaluation  Patient identified by MRN, date of birth, ID band Patient awake    Reviewed: Allergy & Precautions, NPO status , Patient's Chart, lab work & pertinent test results  History of Anesthesia Complications (+) PONV and history of anesthetic complications  Airway Mallampati: III  TM Distance: >3 FB Neck ROM: Full    Dental no notable dental hx. (+) Teeth Intact, Dental Advisory Given   Pulmonary neg pulmonary ROS   Pulmonary exam normal breath sounds clear to auscultation       Cardiovascular negative cardio ROS Normal cardiovascular exam Rhythm:Regular Rate:Normal     Neuro/Psych  Headaches  negative psych ROS   GI/Hepatic Neg liver ROS,GERD  ,,  Endo/Other  negative endocrine ROS    Renal/GU negative Renal ROS  negative genitourinary   Musculoskeletal  (+) Arthritis ,    Abdominal   Peds  Hematology negative hematology ROS (+)   Anesthesia Other Findings   Reproductive/Obstetrics                             Anesthesia Physical Anesthesia Plan  ASA: 2  Anesthesia Plan: Spinal and Regional   Post-op Pain Management: Regional block* and Tylenol PO (pre-op)*   Induction:   PONV Risk Score and Plan: 3 and Treatment may vary due to age or medical condition, Midazolam, Propofol infusion, Dexamethasone and Ondansetron  Airway Management Planned: Natural Airway  Additional Equipment:   Intra-op Plan:   Post-operative Plan:   Informed Consent: I have reviewed the patients History and Physical, chart, labs and discussed the procedure including the risks, benefits and alternatives for the proposed anesthesia with the patient or authorized representative who has indicated his/her understanding and acceptance.     Dental advisory given  Plan Discussed with: CRNA  Anesthesia Plan Comments: (PAT note written 06/08/2023 by Shonna Chock, PA-C.  )        Anesthesia Quick Evaluation

## 2023-06-08 NOTE — Progress Notes (Signed)
Anesthesia Chart Review:  Case: [id number] Date/Time: 06/13/23 0944   Procedure: LEFT TOTAL KNEE ARTHROPLASTY (Left: Knee)   Anesthesia type: Spinal   Pre-op diagnosis: left knee osteoarthritis   [facility]: [facility] ROOM 06 / [facility] OR   Surgeons: Tarry Kos, MD       DISCUSSION: Patient is a 67 year old female scheduled for the above procedure.  History includes never smoker, post-operative N/V, GERD, migraines, skin cancer (BCC), endometriosis (s/p hysterectomy 1995; LOA/BSO 07/12/00, LOA with redo LSO 12/29/04), cholecystectomy.  She had a routine EKG done at [facility] earlier this year that showed SR, RSR in V1-V2 (old). She denied chest pain and SOB at PAT RN visit.   Anesthesia team to evaluate on the day of surgery.   VS: BP 136/67   Pulse 72   Temp 36.8 C   Resp 18   Ht 5\' 2"  (1.575 m)   Wt 77 kg   SpO2 100%   BMI 31.04 kg/m    PROVIDERS: Noberto Retort, MD is PCP. Last visti 02/14/23 with Sanda Klein, PA for health maintenance visit with routine lab and EKG orders.  - She is not routinely followed by cardiology, but reported seeing Donato Schultz, MD > 10 years ago for palpitations. As needed follow-up recommended.    LABS: Labs reviewed: Acceptable for surgery. A1c 5.6% 02/14/23 ([facility]) (all labs ordered are listed, but only abnormal results are displayed)  Labs Reviewed  CBC - Abnormal; Notable for the following components:      Result Value   Platelets 441 (*)    All other components within normal limits  SURGICAL PCR SCREEN  BASIC METABOLIC PANEL     IMAGES: CT Abd 03/09/23: IMPRESSION: Abdominal wall lesion observed on prior ultrasound is nonspecific based on CT appearance with soft tissue density. No adjacent inflammatory changes that would be expected in the setting of an abscess. No definite communication with the peritoneal cavity.   Xray Left Knee 03/22/23: X-rays of the left knee show advanced tricompartmental degenerative joint disease worse  in the patellofemoral compartment.    EKG: EKG 02/14/23 (appears to have been done as part of routine wellness visit; provider marked as reviewed 04/14/23): Per [facility] records: "Sinus rhythm, RSR in V1-V2, 68bpm" Tracing scanned under Media tab.    CV: She had a remote non-ischemic stress test 10/09/99.    Past Medical History:  Diagnosis Date   Arthritis    Atypical nevus 05/23/2000   Left Upper Arm-Mild   Atypical nevus 05/25/2006   Right Calf-Moderate   Atypical nevus 01/23/2010   Left Upper Inner Arm-Mild   Atypical nevus 08/06/2015   Left Forearm-Mild   BCC (basal cell carcinoma of skin) 03/31/2004   Right Lower Back (tx p bx)   GERD (gastroesophageal reflux disease)    History of kidney stones 2010   Migraine    PONV (postoperative nausea and vomiting)    Superficial basal cell carcinoma (BCC) 10/15/1999   Lower Post Neck, Left Mid Back,Left Lower Back and Upper Sternum (all Cx3,5FU)   Superficial basal cell carcinoma (BCC) 03/01/2014   Left Post Shoulder (Cx3,5FU)   Superficial basal cell carcinoma (BCC) 06/26/2014   Right Shoulder (Cx3,5FU)   Superficial basal cell carcinoma (BCC) 06/28/2017   Sup Upper Sternum (tx p bx)    Past Surgical History:  Procedure Laterality Date   ABDOMINAL HYSTERECTOMY     ANTERIOR CRUCIATE LIGAMENT REPAIR     left   BREAST SURGERY  CHOLECYSTECTOMY      MEDICATIONS:  benzonatate (TESSALON PERLES) 100 MG capsule   betamethasone dipropionate 0.05 % cream   betamethasone dipropionate 0.05 % cream   calcium-vitamin D (OSCAL WITH D) 500-200 MG-UNIT per tablet   cetirizine (ZYRTEC) 10 MG tablet   cyclobenzaprine (FLEXERIL) 5 MG tablet   cyclobenzaprine (FLEXERIL) 5 MG tablet   Diclofenac Sodium (PENNSAID) 2 % SOLN   docusate sodium (COLACE) 100 MG capsule   estradiol (VIVELLE-DOT) 0.025 MG/24HR   estradiol (VIVELLE-DOT) 0.025 MG/24HR   estradiol (VIVELLE-DOT) 0.025 MG/24HR   meclizine (ANTIVERT) 12.5 MG tablet   meclizine  (ANTIVERT) 12.5 MG tablet   methocarbamol (ROBAXIN-750) 750 MG tablet   Multiple Vitamins-Minerals (MULTIVITAMINS THER. W/MINERALS) TABS   ondansetron (ZOFRAN) 4 MG tablet   oxyCODONE-acetaminophen (PERCOCET) 5-325 MG tablet   pantoprazole (PROTONIX) 40 MG tablet   rivaroxaban (XARELTO) 10 MG TABS tablet   rOPINIRole (REQUIP) 0.5 MG tablet   rOPINIRole (REQUIP) 1 MG tablet   rOPINIRole (REQUIP) 1 MG tablet   scopolamine (TRANSDERM-SCOP) 1 MG/3DAYS   No current facility-administered medications for this encounter.   By current medication list, she is not taking scopolamine patch, Requip 0.5 mg, Xarelto (to start post-op), Protonix, Tessalon Perles.     Shonna Chock, PA-C Surgical Short Stay/Anesthesiology [facility] Phone [phone] [facility] Phone [phone] 06/08/2023 4:23 PM

## 2023-06-10 MED ORDER — TRANEXAMIC ACID 1000 MG/10ML IV SOLN
2000.0000 mg | INTRAVENOUS | Status: DC
  Filled 2023-06-10: qty 20

## 2023-06-12 NOTE — Discharge Instructions (Signed)

## 2023-06-13 ENCOUNTER — Observation Stay (HOSPITAL_COMMUNITY)
Admission: RE | Admit: 2023-06-13 | Discharge: 2023-06-14 | Disposition: A | Discharge status: Home / Self Care | Payer: PPO | Source: Ambulatory Visit | Attending: Orthopaedic Surgery | Admitting: Orthopaedic Surgery

## 2023-06-13 ENCOUNTER — Other Ambulatory Visit: Payer: Self-pay

## 2023-06-13 ENCOUNTER — Ambulatory Visit (HOSPITAL_COMMUNITY): Payer: PPO | Admitting: Vascular Surgery

## 2023-06-13 ENCOUNTER — Encounter (HOSPITAL_COMMUNITY): Payer: Self-pay | Admitting: Orthopaedic Surgery

## 2023-06-13 ENCOUNTER — Observation Stay (HOSPITAL_COMMUNITY): Payer: PPO

## 2023-06-13 ENCOUNTER — Ambulatory Visit (HOSPITAL_COMMUNITY): Payer: Self-pay | Admitting: Anesthesiology

## 2023-06-13 ENCOUNTER — Encounter (HOSPITAL_COMMUNITY)
Admission: RE | Disposition: A | Discharge status: Home / Self Care | Payer: Self-pay | Source: Ambulatory Visit | Attending: Orthopaedic Surgery

## 2023-06-13 DIAGNOSIS — G8918 Other acute postprocedural pain: Secondary | ICD-10-CM | POA: Diagnosis not present

## 2023-06-13 DIAGNOSIS — Z79899 Other long term (current) drug therapy: Secondary | ICD-10-CM | POA: Diagnosis not present

## 2023-06-13 DIAGNOSIS — R609 Edema, unspecified: Secondary | ICD-10-CM | POA: Diagnosis not present

## 2023-06-13 DIAGNOSIS — Z96652 Presence of left artificial knee joint: Secondary | ICD-10-CM

## 2023-06-13 DIAGNOSIS — M1712 Unilateral primary osteoarthritis, left knee: Secondary | ICD-10-CM

## 2023-06-13 DIAGNOSIS — Z85828 Personal history of other malignant neoplasm of skin: Secondary | ICD-10-CM | POA: Insufficient documentation

## 2023-06-13 HISTORY — PX: TOTAL KNEE ARTHROPLASTY: SHX125

## 2023-06-13 SURGERY — ARTHROPLASTY, KNEE, TOTAL
Anesthesia: Regional | Site: Knee | Laterality: Left

## 2023-06-13 MED ORDER — METOCLOPRAMIDE HCL 5 MG PO TABS: 5.0000 mg | ORAL_TABLET | Freq: Three times a day (TID) | ORAL | Status: DC | PRN

## 2023-06-13 MED ORDER — MIDAZOLAM HCL 2 MG/2ML IJ SOLN
INTRAMUSCULAR | Status: AC
  Administered 2023-06-13: 2 mg via INTRAVENOUS
  Filled 2023-06-13: qty 2

## 2023-06-13 MED ORDER — OXYCODONE HCL 5 MG PO TABS: 10.0000 mg | ORAL_TABLET | ORAL | Status: DC | PRN

## 2023-06-13 MED ORDER — ONDANSETRON HCL 4 MG/2ML IJ SOLN
INTRAMUSCULAR | Status: DC | PRN
  Administered 2023-06-13: 4 mg via INTRAVENOUS

## 2023-06-13 MED ORDER — ACETAMINOPHEN 500 MG PO TABS
ORAL_TABLET | ORAL | Status: AC
  Administered 2023-06-13: 1000 mg via ORAL
  Filled 2023-06-13: qty 2

## 2023-06-13 MED ORDER — ESTRADIOL 0.025 MG/24HR TD PTTW
1.0000 | MEDICATED_PATCH | TRANSDERMAL | Status: DC
Start: 2023-06-13 — End: 2023-06-14
  Administered 2023-06-13: 1 via TRANSDERMAL

## 2023-06-13 MED ORDER — ORAL CARE MOUTH RINSE
15.0000 mL | Freq: Once | OROMUCOSAL | Status: AC
Start: 2023-06-13 — End: 2023-06-13

## 2023-06-13 MED ORDER — FENTANYL CITRATE (PF) 100 MCG/2ML IJ SOLN
25.0000 ug | INTRAMUSCULAR | Status: DC | PRN
  Administered 2023-06-13 (×2): 50 ug via INTRAVENOUS

## 2023-06-13 MED ORDER — ACETAMINOPHEN 325 MG PO TABS: 325.0000 mg | ORAL_TABLET | Freq: Four times a day (QID) | ORAL | Status: DC | PRN

## 2023-06-13 MED ORDER — FENTANYL CITRATE (PF) 100 MCG/2ML IJ SOLN
INTRAMUSCULAR | Status: AC
  Administered 2023-06-13: 50 ug
  Filled 2023-06-13: qty 2

## 2023-06-13 MED ORDER — TRAMADOL HCL 50 MG PO TABS
50.0000 mg | ORAL_TABLET | Freq: Four times a day (QID) | ORAL | Status: DC | PRN
  Administered 2023-06-13 – 2023-06-14 (×2): 50 mg via ORAL
  Filled 2023-06-13 (×2): qty 1

## 2023-06-13 MED ORDER — PRONTOSAN WOUND IRRIGATION OPTIME
TOPICAL | Status: DC | PRN
  Administered 2023-06-13: 350 mL via TOPICAL

## 2023-06-13 MED ORDER — PHENYLEPHRINE HCL-NACL 20-0.9 MG/250ML-% IV SOLN
INTRAVENOUS | Status: DC | PRN
  Administered 2023-06-13: 30 ug/min via INTRAVENOUS

## 2023-06-13 MED ORDER — MENTHOL 3 MG MT LOZG: 1.0000 | LOZENGE | OROMUCOSAL | Status: DC | PRN

## 2023-06-13 MED ORDER — OXYCODONE HCL ER 10 MG PO T12A
10.0000 mg | EXTENDED_RELEASE_TABLET | Freq: Two times a day (BID) | ORAL | Status: DC
  Administered 2023-06-13: 10 mg via ORAL
  Filled 2023-06-13: qty 1

## 2023-06-13 MED ORDER — CEFAZOLIN SODIUM-DEXTROSE 2-4 GM/100ML-% IV SOLN
2.0000 g | INTRAVENOUS | Status: AC
  Administered 2023-06-13: 2 g via INTRAVENOUS

## 2023-06-13 MED ORDER — ACETAMINOPHEN 500 MG PO TABS
1000.0000 mg | ORAL_TABLET | Freq: Four times a day (QID) | ORAL | Status: AC
  Administered 2023-06-13 – 2023-06-14 (×3): 1000 mg via ORAL
  Filled 2023-06-13 (×3): qty 2

## 2023-06-13 MED ORDER — VANCOMYCIN HCL 1000 MG IV SOLR
INTRAVENOUS | Status: AC
  Filled 2023-06-13: qty 20

## 2023-06-13 MED ORDER — METHOCARBAMOL 1000 MG/10ML IJ SOLN: 500.0000 mg | Freq: Four times a day (QID) | INTRAMUSCULAR | Status: DC | PRN

## 2023-06-13 MED ORDER — DEXAMETHASONE SODIUM PHOSPHATE 10 MG/ML IJ SOLN: 10.0000 mg | Freq: Once | INTRAMUSCULAR | Status: DC

## 2023-06-13 MED ORDER — SODIUM CHLORIDE 0.9 % IR SOLN
Status: DC | PRN
  Administered 2023-06-13: 1000 mL

## 2023-06-13 MED ORDER — KETOROLAC TROMETHAMINE 15 MG/ML IJ SOLN
15.0000 mg | Freq: Four times a day (QID) | INTRAMUSCULAR | Status: AC
  Administered 2023-06-13 – 2023-06-14 (×4): 15 mg via INTRAVENOUS
  Filled 2023-06-13 (×4): qty 1

## 2023-06-13 MED ORDER — CHLORHEXIDINE GLUCONATE 0.12 % MT SOLN: 15.0000 mL | Freq: Once | OROMUCOSAL | Status: AC

## 2023-06-13 MED ORDER — DOCUSATE SODIUM 100 MG PO CAPS
100.0000 mg | ORAL_CAPSULE | Freq: Two times a day (BID) | ORAL | Status: DC
  Administered 2023-06-13 – 2023-06-14 (×3): 100 mg via ORAL
  Filled 2023-06-13 (×3): qty 1

## 2023-06-13 MED ORDER — DEXAMETHASONE SODIUM PHOSPHATE 10 MG/ML IJ SOLN
10.0000 mg | Freq: Once | INTRAMUSCULAR | Status: AC
  Administered 2023-06-14: 10 mg via INTRAVENOUS
  Filled 2023-06-13: qty 1

## 2023-06-13 MED ORDER — POVIDONE-IODINE 10 % EX SWAB
2.0000 | Freq: Once | CUTANEOUS | Status: AC
  Administered 2023-06-13: 2 via TOPICAL

## 2023-06-13 MED ORDER — CEFAZOLIN SODIUM-DEXTROSE 2-4 GM/100ML-% IV SOLN
2.0000 g | Freq: Four times a day (QID) | INTRAVENOUS | Status: AC
  Administered 2023-06-13 (×2): 2 g via INTRAVENOUS
  Filled 2023-06-13 (×2): qty 100

## 2023-06-13 MED ORDER — 0.9 % SODIUM CHLORIDE (POUR BTL) OPTIME
TOPICAL | Status: DC | PRN
  Administered 2023-06-13: 1000 mL

## 2023-06-13 MED ORDER — BUPIVACAINE-MELOXICAM ER 400-12 MG/14ML IJ SOLN
INTRAMUSCULAR | Status: DC | PRN
  Administered 2023-06-13: 400 mg

## 2023-06-13 MED ORDER — PHENOL 1.4 % MT LIQD: 1.0000 | OROMUCOSAL | Status: DC | PRN

## 2023-06-13 MED ORDER — TRANEXAMIC ACID-NACL 1000-0.7 MG/100ML-% IV SOLN
1000.0000 mg | INTRAVENOUS | Status: AC
  Administered 2023-06-13: 1000 mg via INTRAVENOUS

## 2023-06-13 MED ORDER — LACTATED RINGERS IV SOLN: INTRAVENOUS | Status: DC

## 2023-06-13 MED ORDER — TRANEXAMIC ACID-NACL 1000-0.7 MG/100ML-% IV SOLN
INTRAVENOUS | Status: AC
  Filled 2023-06-13: qty 100

## 2023-06-13 MED ORDER — BUPIVACAINE-MELOXICAM ER 400-12 MG/14ML IJ SOLN
INTRAMUSCULAR | Status: AC
  Filled 2023-06-13: qty 1

## 2023-06-13 MED ORDER — PROPOFOL 10 MG/ML IV BOLUS
INTRAVENOUS | Status: DC | PRN
  Administered 2023-06-13: 30 mg via INTRAVENOUS

## 2023-06-13 MED ORDER — ONDANSETRON HCL 4 MG PO TABS
4.0000 mg | ORAL_TABLET | Freq: Four times a day (QID) | ORAL | Status: DC | PRN
  Administered 2023-06-14: 4 mg via ORAL
  Filled 2023-06-13: qty 1

## 2023-06-13 MED ORDER — TRANEXAMIC ACID-NACL 1000-0.7 MG/100ML-% IV SOLN
1000.0000 mg | Freq: Once | INTRAVENOUS | Status: AC
  Administered 2023-06-13: 1000 mg via INTRAVENOUS
  Filled 2023-06-13: qty 100

## 2023-06-13 MED ORDER — FENTANYL CITRATE (PF) 100 MCG/2ML IJ SOLN
INTRAMUSCULAR | Status: AC
  Filled 2023-06-13: qty 2

## 2023-06-13 MED ORDER — METOCLOPRAMIDE HCL 5 MG/ML IJ SOLN: 5.0000 mg | Freq: Three times a day (TID) | INTRAMUSCULAR | Status: DC | PRN

## 2023-06-13 MED ORDER — CHLORHEXIDINE GLUCONATE 0.12 % MT SOLN
OROMUCOSAL | Status: AC
  Administered 2023-06-13: 15 mL via OROMUCOSAL
  Filled 2023-06-13: qty 15

## 2023-06-13 MED ORDER — ACETAMINOPHEN 500 MG PO TABS: 1000.0000 mg | ORAL_TABLET | Freq: Once | ORAL | Status: AC

## 2023-06-13 MED ORDER — CEFAZOLIN SODIUM-DEXTROSE 2-4 GM/100ML-% IV SOLN
INTRAVENOUS | Status: AC
  Filled 2023-06-13: qty 100

## 2023-06-13 MED ORDER — ROPIVACAINE HCL 5 MG/ML IJ SOLN
INTRAMUSCULAR | Status: DC | PRN
  Administered 2023-06-13: 20 mL via PERINEURAL

## 2023-06-13 MED ORDER — TRANEXAMIC ACID 1000 MG/10ML IV SOLN
INTRAVENOUS | Status: DC | PRN
  Administered 2023-06-13: 2000 mg via TOPICAL

## 2023-06-13 MED ORDER — FENTANYL CITRATE (PF) 100 MCG/2ML IJ SOLN: 50.0000 ug | Freq: Once | INTRAMUSCULAR | Status: AC

## 2023-06-13 MED ORDER — FERROUS SULFATE 325 (65 FE) MG PO TABS
325.0000 mg | ORAL_TABLET | Freq: Three times a day (TID) | ORAL | Status: DC
  Administered 2023-06-13 – 2023-06-14 (×3): 325 mg via ORAL
  Filled 2023-06-13 (×3): qty 1

## 2023-06-13 MED ORDER — VANCOMYCIN HCL 1000 MG IV SOLR
INTRAVENOUS | Status: DC | PRN
  Administered 2023-06-13: 1000 mg

## 2023-06-13 MED ORDER — RIVAROXABAN 10 MG PO TABS
10.0000 mg | ORAL_TABLET | Freq: Every day | ORAL | Status: DC
  Administered 2023-06-14: 10 mg via ORAL
  Filled 2023-06-13: qty 1

## 2023-06-13 MED ORDER — HYDROMORPHONE HCL 1 MG/ML IJ SOLN
0.5000 mg | INTRAMUSCULAR | Status: DC | PRN
  Administered 2023-06-13: 1 mg via INTRAVENOUS
  Filled 2023-06-13: qty 1

## 2023-06-13 MED ORDER — MIDAZOLAM HCL 2 MG/2ML IJ SOLN
2.0000 mg | Freq: Once | INTRAMUSCULAR | Status: AC
Start: 2023-06-13 — End: 2023-06-13

## 2023-06-13 MED ORDER — BUPIVACAINE IN DEXTROSE 0.75-8.25 % IT SOLN
INTRATHECAL | Status: DC | PRN
Start: 2023-06-13 — End: 2023-06-13
  Administered 2023-06-13: 1.6 mL via INTRATHECAL

## 2023-06-13 MED ORDER — ONDANSETRON HCL 4 MG/2ML IJ SOLN
INTRAMUSCULAR | Status: AC
  Filled 2023-06-13: qty 2

## 2023-06-13 MED ORDER — OXYCODONE HCL 5 MG PO TABS
5.0000 mg | ORAL_TABLET | ORAL | Status: DC | PRN
Start: 2023-06-13 — End: 2023-06-14
  Administered 2023-06-13: 10 mg via ORAL
  Filled 2023-06-13: qty 2

## 2023-06-13 MED ORDER — ROPINIROLE HCL 1 MG PO TABS
1.0000 mg | ORAL_TABLET | Freq: Every day | ORAL | Status: DC
  Administered 2023-06-13 – 2023-06-14 (×2): 1 mg via ORAL
  Filled 2023-06-13 (×2): qty 1

## 2023-06-13 MED ORDER — METHOCARBAMOL 500 MG PO TABS
500.0000 mg | ORAL_TABLET | Freq: Four times a day (QID) | ORAL | Status: DC | PRN
  Administered 2023-06-13 (×2): 500 mg via ORAL
  Filled 2023-06-13 (×2): qty 1

## 2023-06-13 MED ORDER — PROPOFOL 500 MG/50ML IV EMUL
INTRAVENOUS | Status: DC | PRN
  Administered 2023-06-13: 100 ug/kg/min via INTRAVENOUS

## 2023-06-13 MED ORDER — ONDANSETRON HCL 4 MG/2ML IJ SOLN
4.0000 mg | Freq: Four times a day (QID) | INTRAMUSCULAR | Status: DC | PRN
  Administered 2023-06-13 (×2): 4 mg via INTRAVENOUS
  Filled 2023-06-13 (×2): qty 2

## 2023-06-13 MED ORDER — DEXAMETHASONE SODIUM PHOSPHATE 10 MG/ML IJ SOLN
INTRAMUSCULAR | Status: DC | PRN
  Administered 2023-06-13: 10 mg

## 2023-06-13 SURGICAL SUPPLY — 76 items
ALCOHOL 70% 16 OZ (MISCELLANEOUS) ×1 IMPLANT
BAG COUNTER SPONGE SURGICOUNT (BAG) IMPLANT
BAG DECANTER FOR FLEXI CONT (MISCELLANEOUS) ×1 IMPLANT
BANDAGE ESMARK 6X9 LF (GAUZE/BANDAGES/DRESSINGS) IMPLANT
BLADE SAG 18X100X1.27 (BLADE) ×1 IMPLANT
BLADE SAW SGTL 73X25 THK (BLADE) ×1 IMPLANT
BNDG ESMARK 6X9 LF (GAUZE/BANDAGES/DRESSINGS)
BOWL SMART MIX CTS (DISPOSABLE) ×1 IMPLANT
CEMENT BONE REFOBACIN R1X40 US (Cement) IMPLANT
CLSR STERI-STRIP ANTIMIC 1/2X4 (GAUZE/BANDAGES/DRESSINGS) ×2 IMPLANT
COMP FEM CEMT PERSONA STD SZ7 (Knees) ×1 IMPLANT
COMPONENT FEM CMT PRSN STD SZ7 (Knees) IMPLANT
COOLER ICEMAN CLASSIC (MISCELLANEOUS) ×1 IMPLANT
COVER SURGICAL LIGHT HANDLE (MISCELLANEOUS) ×1 IMPLANT
CUFF TOURN SGL QUICK 42 (TOURNIQUET CUFF) IMPLANT
CUFF TRNQT CYL 34X4.125X (TOURNIQUET CUFF) ×1 IMPLANT
DERMABOND ADVANCED .7 DNX12 (GAUZE/BANDAGES/DRESSINGS) ×1 IMPLANT
DRAPE EXTREMITY T 121X128X90 (DISPOSABLE) ×1 IMPLANT
DRAPE HALF SHEET 40X57 (DRAPES) ×1 IMPLANT
DRAPE INCISE IOBAN 66X45 STRL (DRAPES) ×1 IMPLANT
DRAPE POUCH INSTRU U-SHP 10X18 (DRAPES) ×1 IMPLANT
DRAPE SURG ORHT 6 SPLT 77X108 (DRAPES) IMPLANT
DRAPE U-SHAPE 47X51 STRL (DRAPES) ×2 IMPLANT
DRSG AQUACEL AG ADV 3.5X10 (GAUZE/BANDAGES/DRESSINGS) ×1 IMPLANT
DURAPREP 26ML APPLICATOR (WOUND CARE) ×3 IMPLANT
ELECT CAUTERY BLADE 6.4 (BLADE) ×1 IMPLANT
ELECT PENCIL ROCKER SW 15FT (MISCELLANEOUS) ×1 IMPLANT
ELECT REM PT RETURN 9FT ADLT (ELECTROSURGICAL) ×1
ELECTRODE REM PT RTRN 9FT ADLT (ELECTROSURGICAL) ×1 IMPLANT
GLOVE BIOGEL PI IND STRL 7.0 (GLOVE) ×2 IMPLANT
GLOVE BIOGEL PI IND STRL 7.5 (GLOVE) ×5 IMPLANT
GLOVE ECLIPSE 7.0 STRL STRAW (GLOVE) ×3 IMPLANT
GLOVE INDICATOR 7.0 STRL GRN (GLOVE) ×1 IMPLANT
GLOVE INDICATOR 7.5 STRL GRN (GLOVE) ×1 IMPLANT
GLOVE SURG SYN 7.5 E (GLOVE) ×2 IMPLANT
GLOVE SURG SYN 7.5 PF PI (GLOVE) ×2 IMPLANT
GLOVE SURG UNDER LTX SZ7.5 (GLOVE) ×2 IMPLANT
GLOVE SURG UNDER POLY LF SZ7 (GLOVE) ×2 IMPLANT
GOWN STRL REUS W/ TWL LRG LVL3 (GOWN DISPOSABLE) ×1 IMPLANT
GOWN STRL SURGICAL XL XLNG (GOWN DISPOSABLE) ×1 IMPLANT
GOWN TOGA ZIPPER T7+ PEEL AWAY (MISCELLANEOUS) ×2 IMPLANT
HOOD PEEL AWAY T7 (MISCELLANEOUS) ×1 IMPLANT
KIT BASIN OR (CUSTOM PROCEDURE TRAY) ×1 IMPLANT
KIT TURNOVER KIT B (KITS) ×1 IMPLANT
MANIFOLD NEPTUNE II (INSTRUMENTS) ×1 IMPLANT
MARKER SKIN DUAL TIP RULER LAB (MISCELLANEOUS) ×2 IMPLANT
NDL SPNL 18GX3.5 QUINCKE PK (NEEDLE) ×1 IMPLANT
NEEDLE SPNL 18GX3.5 QUINCKE PK (NEEDLE) ×1 IMPLANT
NS IRRIG 1000ML POUR BTL (IV SOLUTION) ×1 IMPLANT
PACK TOTAL JOINT (CUSTOM PROCEDURE TRAY) ×1 IMPLANT
PAD ARMBOARD 7.5X6 YLW CONV (MISCELLANEOUS) ×2 IMPLANT
PAD COLD SHLDR WRAP-ON (PAD) ×1 IMPLANT
PIN DRILL HDLS TROCAR 75 4PK (PIN) IMPLANT
SCREW FEMALE HEX FIX 25X2.5 (ORTHOPEDIC DISPOSABLE SUPPLIES) IMPLANT
SET HNDPC FAN SPRY TIP SCT (DISPOSABLE) ×1 IMPLANT
SOLUTION PRONTOSAN WOUND 350ML (IRRIGATION / IRRIGATOR) ×1 IMPLANT
STAPLER VISISTAT 35W (STAPLE) IMPLANT
STEM POLY PAT PLY 32M KNEE (Knees) IMPLANT
STEM TIB ST PERS 14+30 (Stem) IMPLANT
STEM TIBIA 5 DEG SZ E L KNEE (Knees) IMPLANT
STEM TIBIAL SZ6-7 E-F10 (Stem) IMPLANT
SUCTION TUBE FRAZIER 10FR DISP (SUCTIONS) ×1 IMPLANT
SUT ETHILON 2 0 FS 18 (SUTURE) IMPLANT
SUT MNCRL AB 3-0 PS2 27 (SUTURE) IMPLANT
SUT VIC AB 0 CT1 27XBRD ANBCTR (SUTURE) ×2 IMPLANT
SUT VIC AB 1 CTX 27 (SUTURE) ×3 IMPLANT
SUT VIC AB 2-0 CT1 TAPERPNT 27 (SUTURE) ×4 IMPLANT
SYR 50ML LL SCALE MARK (SYRINGE) ×2 IMPLANT
TIBIA STEM 5 DEG SZ E L KNEE (Knees) ×1 IMPLANT
TOWEL GREEN STERILE (TOWEL DISPOSABLE) ×1 IMPLANT
TOWEL GREEN STERILE FF (TOWEL DISPOSABLE) ×1 IMPLANT
TRAY CATH INTERMITTENT SS 16FR (CATHETERS) IMPLANT
TUBE SUCT ARGYLE STRL (TUBING) ×1 IMPLANT
UNDERPAD 30X36 HEAVY ABSORB (UNDERPADS AND DIAPERS) ×1 IMPLANT
WARMER LAPAROSCOPE (MISCELLANEOUS) IMPLANT
YANKAUER SUCT BULB TIP NO VENT (SUCTIONS) ×2 IMPLANT

## 2023-06-13 NOTE — H&P (Signed)
PREOPERATIVE H&P  Chief Complaint: left knee osteoarthritis  HPI: Kendra Le is a 67 y.o. female who presents for surgical treatment of left knee osteoarthritis.  She denies any changes in medical history.  Past Surgical History:  Procedure Laterality Date   ABDOMINAL HYSTERECTOMY     ANTERIOR CRUCIATE LIGAMENT REPAIR     left   BREAST SURGERY     CHOLECYSTECTOMY     Social History   Socioeconomic History   Marital status: Married    Spouse name: Not on file   Number of children: Not on file   Years of education: Not on file   Highest education level: Not on file  Occupational History   Not on file  Tobacco Use   Smoking status: Never   Smokeless tobacco: Never  Vaping Use   Vaping status: Never Used  Substance and Sexual Activity   Alcohol use: No    Alcohol/week: 0.0 standard drinks of alcohol   Drug use: No   Sexual activity: Not on file  Other Topics Concern   Not on file  Social History Narrative   Not on file   Social Drivers of Health   Financial Resource Strain: Not on file  Food Insecurity: Not on file  Transportation Needs: Not on file  Physical Activity: Not on file  Stress: Not on file  Social Connections: Not on file   Family History  Problem Relation Age of Onset   Heart attack Mother    Hyperlipidemia Mother    Diabetes Mother    Heart attack Father    Hyperlipidemia Father    Hypertension Father    Sudden death Neg Hx    Allergies  Allergen Reactions   Erythromycin    Latex Other (See Comments)    Other   Penicillins Nausea And Vomiting   Prior to Admission medications   Medication Sig Start Date End Date Taking? Authorizing Provider  betamethasone dipropionate 0.05 % cream Apply 1 Application topically daily. To affected area(s) Patient taking differently: Apply 1 Application topically daily as needed (irritation). 09/05/22  Yes   calcium-vitamin D (OSCAL WITH D) 500-200 MG-UNIT per tablet Take 1 tablet by mouth at  bedtime.   Yes [provider]  cetirizine (ZYRTEC) 10 MG tablet Take 10 mg by mouth at bedtime.   Yes [provider]  cyclobenzaprine (FLEXERIL) 5 MG tablet Take 1 tablet (5 mg total) by mouth at bedtime as needed for muscle spasms 02/14/23  Yes   estradiol (VIVELLE-DOT) 0.025 MG/24HR Place 1 patch onto the skin 2 (two) times a week. 09/27/22  Yes   meclizine (ANTIVERT) 12.5 MG tablet Take 0.5-1 tablets (6.25-12.5 mg total) by mouth every 12 (twelve) hours as needed for vertigo 02/14/23  Yes   Multiple Vitamins-Minerals (MULTIVITAMINS THER. W/MINERALS) TABS Take 1 tablet by mouth daily.   Yes [provider]  rOPINIRole (REQUIP) 1 MG tablet Take 1 tablet (1 mg total) by mouth 1 to 3 hours before bedtime for restless leg. 01/26/22  Yes   rOPINIRole (REQUIP) 1 MG tablet Take 1 tablet (1 mg total) by mouth 1 to 3 hours before bedtime once daily for restless legs. 02/16/23  Yes   benzonatate (TESSALON PERLES) 100 MG capsule Take 1 capsule (100 mg total) by mouth 3 (three) times daily as needed. Patient not taking: Reported on 06/02/2023 09/08/22   Jannifer Rodney A, FNP  betamethasone dipropionate 0.05 % cream Apply to affected area daily. Patient not taking: Reported on  06/02/2023 08/29/22     cyclobenzaprine (FLEXERIL) 5 MG tablet Take 1 tablet by mouth at bedtime as needed for muscle spasm Patient not taking: Reported on 06/02/2023 11/06/21     Diclofenac Sodium (PENNSAID) 2 % SOLN Apply 2 g topically 2 (two) times daily as needed (to affected area). Patient not taking: Reported on 06/02/2023 09/01/21   Tarry Kos, MD  docusate sodium (COLACE) 100 MG capsule Take 1 capsule (100 mg total) by mouth daily as needed. 06/06/23 06/05/24  Cristie Hem, PA-C  estradiol (VIVELLE-DOT) 0.025 MG/24HR APPLY 1 PATCH ON TO THE SKIN TWICE WEEKLY Patient not taking: Reported on 06/02/2023 09/08/20 09/08/21  Harold Hedge, MD  estradiol (VIVELLE-DOT) 0.025 MG/24HR Place 1 patch onto the skin 2  (two) times a week. Patient not taking: Reported on 06/02/2023 09/14/21     meclizine (ANTIVERT) 12.5 MG tablet Take 1/2-1 tablet by mouth as needed every 12 hrs for vertigo Patient not taking: Reported on 06/02/2023 11/06/21     methocarbamol (ROBAXIN-750) 750 MG tablet Take 1 tablet (750 mg total) by mouth 2 (two) times daily as needed for muscle spasms. 06/06/23   Cristie Hem, PA-C  ondansetron (ZOFRAN) 4 MG tablet Take 1 tablet (4 mg total) by mouth every 8 (eight) hours as needed for nausea or vomiting. 06/06/23   Cristie Hem, PA-C  oxyCODONE-acetaminophen (PERCOCET) 5-325 MG tablet Take 1-2 tablets by mouth every 6 (six) hours as needed. To be taken after surgery 06/06/23   Cristie Hem, PA-C  pantoprazole (PROTONIX) 40 MG tablet Take 1 tablet (40 mg total) by mouth daily 20 minutes before breakfast. Patient not taking: Reported on 06/02/2023 08/27/21     rivaroxaban (XARELTO) 10 MG TABS tablet Take 1 tablet (10 mg total) by mouth daily. To be taken after surgery to prevent  blood clots 06/06/23   Cristie Hem, PA-C  rOPINIRole (REQUIP) 0.5 MG tablet Take 1 tablet by mouth 1 to 3 hours before bedtime for restless leg 30 day(s) Patient not taking: Reported on 06/02/2023 11/06/21     scopolamine (TRANSDERM-SCOP) 1 MG/3DAYS Place 1 patch onto the skin every 3 (three) days as needed. Patient not taking: Reported on 09/30/2021 09/11/21        Positive ROS: All other systems have been reviewed and were otherwise negative with the exception of those mentioned in the HPI and as above.  Physical Exam: General: Alert, no acute distress Cardiovascular: No pedal edema Respiratory: No cyanosis, no use of accessory musculature GI: abdomen soft Skin: No lesions in the area of chief complaint Neurologic: Sensation intact distally Psychiatric: Patient is competent for consent with normal mood and affect Lymphatic: no lymphedema  MUSCULOSKELETAL: exam stable  Assessment: left knee  osteoarthritis  Plan: Plan for Procedure(s): LEFT TOTAL KNEE ARTHROPLASTY  The risks benefits and alternatives were discussed with the patient including but not limited to the risks of nonoperative treatment, versus surgical intervention including infection, bleeding, nerve injury,  blood clots, cardiopulmonary complications, morbidity, mortality, among others, and they were willing to proceed.   Glee Arvin, MD 06/13/2023 8:52 AM

## 2023-06-13 NOTE — Evaluation (Signed)
Physical Therapy Evaluation Patient Details Name: Kendra Le MRN: [id number] DOB: 02-21-1956 Today's Date: 06/13/2023  History of Present Illness  Pt is a 67 y.o. F who presents for L TKA 06/13/2023. Significant PMH: none.  Clinical Impression  Pt evaluated s/p L TKA. Pt presents with good pain control and overall is mobilizing well. Ambulating 150 ft with walker, utilizing a step through pattern. Initiated reviewing HEP and written handout provided. Will trial stairs in the morning, but don't anticipate any issues.      If plan is discharge home, recommend the following: Assist for transportation;Help with stairs or ramp for entrance   Can travel by private vehicle        Equipment Recommendations None recommended by PT  Recommendations for Other Services       Functional Status Assessment Patient has had a recent decline in their functional status and demonstrates the ability to make significant improvements in function in a reasonable and predictable amount of time.     Precautions / Restrictions Precautions Precautions: None Restrictions Weight Bearing Restrictions Per Provider Order: No LLE Weight Bearing Per Provider Order: Weight bearing as tolerated      Mobility  Bed Mobility Overal bed mobility: Modified Independent                  Transfers Overall transfer level: Needs assistance Equipment used: Rolling walker (2 wheels) Transfers: Sit to/from Stand Sit to Stand: Supervision                Ambulation/Gait Ambulation/Gait assistance: Supervision, Contact guard assist Gait Distance (Feet): 150 Feet Assistive device: Rolling walker (2 wheels) Gait Pattern/deviations: Step-through pattern, Decreased stance time - left, Decreased weight shift to left       General Gait Details: Good heel strike at initial contact, step through pattern  Stairs            Wheelchair Mobility     Tilt Bed    Modified Rankin (Stroke Patients  Only)       Balance Overall balance assessment: Mild deficits observed, not formally tested                                           Pertinent Vitals/Pain Pain Assessment Pain Assessment: Faces Faces Pain Scale: Hurts a little bit Pain Location: L knee Pain Descriptors / Indicators: Operative site guarding Pain Intervention(s): Monitored during session, Premedicated before session    Home Living Family/patient expects to be discharged to:: Private residence Living Arrangements: Spouse/significant other Available Help at Discharge: Family Type of Home: House Home Access: Stairs to enter (2 through garage, none through main entrance) Entrance Stairs-Rails: None     Home Layout: One level Home Equipment: Agricultural consultant (2 wheels);BSC/3in1      Prior Function Prior Level of Function : Independent/Modified Independent             Mobility Comments: 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, Civil Service Extremity Assessment Upper Extremity Assessment: Overall WFL for tasks assessed    Lower Extremity Assessment Lower Extremity Assessment: LLE deficits/detail LLE Deficits / Details: expected post op deficits    Cervical / Trunk Assessment Cervical / Trunk Assessment: Normal  Communication   Communication Communication: No apparent difficulties  Cognition Arousal: Alert Behavior During Therapy: [facility] for tasks assessed/performed Overall Cognitive  Status: Within Functional Limits for tasks assessed                                          General Comments      Exercises Total Joint Exercises Ankle Circles/Pumps: AROM, Both, 20 reps, Supine Quad Sets: Left, 10 reps, Supine Hip ABduction/ADduction: AROM, Left, 5 reps, Supine   Assessment/Plan    PT Assessment Patient needs continued PT services  PT Problem List Decreased strength;Decreased range of motion;Decreased balance;Decreased  mobility;Pain       PT Treatment Interventions DME instruction;Gait training;Functional mobility training;Therapeutic activities;Stair training;Therapeutic exercise;Balance training;Patient/family education    PT Goals (Current goals can be found in the Care Plan section)  Acute Rehab PT Goals Patient Stated Goal: to go on trip in March PT Goal Formulation: With patient Time For Goal Achievement: 06/27/23 Potential to Achieve Goals: Good    Frequency 7X/week     Co-evaluation               AM-PAC PT "6 Clicks" Mobility  Outcome Measure Help needed turning from your back to your side while in a flat bed without using bedrails?: None Help needed moving from lying on your back to sitting on the side of a flat bed without using bedrails?: None Help needed moving to and from a bed to a chair (including a wheelchair)?: A Little Help needed standing up from a chair using your arms (e.g., wheelchair or bedside chair)?: A Little Help needed to walk in hospital room?: A Little Help needed climbing 3-5 steps with a railing? : A Little 6 Click Score: 20    End of Session Equipment Utilized During Treatment: Gait belt Activity Tolerance: Patient tolerated treatment well Patient left: in chair;with call bell/phone within reach Nurse Communication: Mobility status PT Visit Diagnosis: Other abnormalities of gait and mobility (R26.89);Pain Pain - Right/Left: Left Pain - part of body: Knee    Time: 2130-8657 PT Time Calculation (min) (ACUTE ONLY): 28 min   Charges:   PT Evaluation $PT Eval Low Complexity: 1 Low PT Treatments $Therapeutic Activity: 8-22 mins PT General Charges $$ ACUTE PT VISIT: 1 Visit         Lillia Pauls, PT, DPT Acute Rehabilitation Services Office [phone]   Norval Morton 06/13/2023, 4:37 PM

## 2023-06-13 NOTE — Op Note (Signed)
Total Knee Arthroplasty Procedure Note  Preoperative diagnosis: Left knee osteoarthritis  Postoperative diagnosis:same  Operative findings: Complete loss joint space from all 3 compartments Mild valgus deformity Osteopenic bone  Operative procedure: Left total knee arthroplasty. CPT [id number]  Surgeon: N. Glee Arvin, MD  Assist: Youlanda Roys, RNFA  Anesthesia: Spinal, regional, local  Tourniquet time: see anesthesia record  Implants used: Zimmer persona Femur: CR 7 Tibia: E with 30 mm cemented stem Patella: 32 mm Polyethylene: 10 mm medial congruent  Indication: Kendra Le is a 67 y.o. year old female with a history of knee pain. Having failed conservative management, the patient elected to proceed with a total knee arthroplasty.  We have reviewed the risk and benefits of the surgery and they elected to proceed after voicing understanding.  Procedure:  After informed consent was obtained and understanding of the risk were voiced including but not limited to bleeding, infection, damage to surrounding structures including nerves and vessels, blood clots, leg length inequality and the failure to achieve desired results, the operative extremity was marked with verbal confirmation of the patient in the holding area.   The patient was then brought to the operating room and transported to the operating room table in the supine position.  A tourniquet was applied to the operative extremity around the upper thigh. The operative limb was then prepped and draped in the usual sterile fashion and preoperative antibiotics were administered.  A time out was performed prior to the start of surgery confirming the correct extremity, preoperative antibiotic administration, as well as team members, implants and instruments available for the case. Correct surgical site was also confirmed with preoperative radiographs. The limb was then elevated for exsanguination and the tourniquet was inflated. A  midline incision was made and a standard medial parapatellar approach was performed.  The infrapatellar fat pad was removed.  Suprapatellar synovium was removed to reveal the anterior distal femoral cortex.  A medial peel was performed to release the capsule and the deep MCL off of the medial tibial plateau back to the semimembranosus.  The patella was then everted which showed complete loss of articular cartilage and was prepared and sized to a 32 mm.  A cover was placed on the patella for protection from retractors.  The knee was then brought into full flexion and we then turned our attention to the femur.  The ACL was sacrificed.  Start site was drilled in the femur and the intramedullary distal femoral cutting guide was placed, set at 5 degrees valgus, taking 12 mm of distal resection. The distal cut was made. Osteophytes were then removed.  Next, the proximal tibial cutting guide was placed with appropriate slope, varus/valgus alignment and depth of resection.  The drop rod was attached to confirm that it was aimed at the second metatarsal.  The proximal tibial cut was made taking 4 mm off the low side. Gap blocks were then used to assess the extension gap and alignment, and appropriate soft tissue releases were performed. Attention was turned back to the femur, which was sized using the sizing guide to a size 7. Appropriate rotation of the femoral component was determined using epicondylar axis, Whiteside's line, and assessing the flexion gap under ligament tension. The appropriate size 4-in-1 cutting block was placed and checked with an angel wing and cuts were made. Posterior femoral osteophytes and uncapped bone were then removed with the curved osteotome.  The menisci were removed.  Trial components were placed, and stability was checked  in full extension, mid-flexion, and deep flexion.  PCL was resected in order to loosen the flexion space. Proper tibial rotation was determined and marked.  The patella  tracked well with a lateral release.  The femoral lugs were then drilled. Trial components were then removed and tibial preparation performed.  The trial tibia was pointed to the medial third of the tibial tubercle.  The tibia was sized for a size E with 30 mm cemented stem component.   The bony surfaces were irrigated with a pulse lavage and then dried. Bone cement was vacuum mixed on the back table, and the final components sized above were cemented into place.  Antibiotic irrigation was placed in the knee joint and soft tissues while the cement cured.  After cement had finished curing, excess cement was removed. The stability of the construct was re-evaluated throughout a range of motion and found to be acceptable. The trial liner was removed, the knee was copiously irrigated, and the knee was re-evaluated for any excess bone debris. The real polyethylene liner, 10 mm thick, was inserted and checked to ensure the locking mechanism had engaged appropriately. The tourniquet was deflated and hemostasis was achieved. The wound was irrigated with normal saline.  One gram of vancomycin powder was placed in the surgical bed.  Topical mixture of 0.25% bupivacaine and meloxicam was placed in the joint for postoperative pain.  Capsular closure was performed with a #1 vicryl in flexion, subcutaneous fat closed with a 0 vicryl suture, then subcutaneous tissue closed with interrupted 2.0 vicryl suture. The skin was then closed with a 2.0 nylon and dermabond. A sterile dressing was applied.  The patient was awakened in the operating room and taken to recovery in stable condition. All sponge, needle, and instrument counts were correct at the end of the case.  Position: supine  Complications: none.  Time Out: performed   Drains/Packing: none  Estimated blood loss: minimal  Returned to Recovery Room: in good condition.   Antibiotics: yes   Mechanical VTE (DVT) Prophylaxis: sequential compression devices, TED  thigh-high  Chemical VTE (DVT) Prophylaxis: xarelto  Fluid Replacement  Crystalloid: see anesthesia record Blood: none  FFP: none   Specimens Removed: 1 to pathology   Sponge and Instrument Count Correct? yes   PACU: portable radiograph - knee AP and Lateral   Plan/RTC: Return in 2 weeks for wound check.   Weight Bearing/Load Lower Extremity: full   Implant Name Type Inv. Item Serial No. Manufacturer Lot No. LRB No. Used Action  CEMENT BONE REFOBACIN [id number] [place] - [id number] Cement CEMENT BONE REFOBACIN [id number] [place]  ZIMMER RECON(ORTH,TRAU,BIO,SG) [id number] Left 2 Implanted  STEM POLY PAT PLY 69M KNEE - [id number] Knees STEM POLY PAT PLY 69M KNEE  ZIMMER RECON(ORTH,TRAU,BIO,SG) [id number] Left 1 Implanted  COMP FEM CEMT PERSONA STD SZ7 - [id number] Knees COMP FEM CEMT PERSONA STD SZ7  ZIMMER RECON(ORTH,TRAU,BIO,SG) [id number] Left 1 Implanted  TIBIA STEM 5 DEG SZ E L KNEE - [id number] Knees TIBIA STEM 5 DEG SZ E L KNEE  ZIMMER RECON(ORTH,TRAU,BIO,SG) [id number] Left 1 Implanted  STEM TIB ST PERS 14+30 - [id number] Stem STEM TIB ST PERS 14+30  ZIMMER RECON(ORTH,TRAU,BIO,SG) [id number] Left 1 Implanted  STEM TIBIAL SZ6-7 E-F10 - [id number] Stem STEM TIBIAL SZ6-7 E-F10  ZIMMER RECON(ORTH,TRAU,BIO,SG) [id number] Left 1 Implanted    N. Glee Arvin, MD [facility] 11:15 AM

## 2023-06-13 NOTE — Transfer of Care (Signed)
Immediate Anesthesia Transfer of Care Note  Patient: [name]  Procedure(s) Performed: LEFT TOTAL KNEE ARTHROPLASTY (Left: Knee)  Patient Location: PACU  Anesthesia Type:Spinal  Level of Consciousness: awake, alert , oriented, and patient cooperative  Airway & Oxygen Therapy: Patient Spontanous Breathing and Patient connected to nasal cannula oxygen  Post-op Assessment: Report given to RN, Post -op Vital signs reviewed and stable, and Patient moving all extremities  Post vital signs: Reviewed and stable  Last Vitals:  Vitals Value Taken Time  BP 99/52 06/13/23 1202  Temp    Pulse 71 06/13/23 1205  Resp 14 06/13/23 1205  SpO2 99 % 06/13/23 1205  Vitals shown include unfiled device data.  Last Pain:  Vitals:   06/13/23 0925  TempSrc:   PainSc: 0-No pain         Complications: No notable events documented.

## 2023-06-13 NOTE — Anesthesia Procedure Notes (Signed)
Anesthesia Regional Block: Adductor canal block   Pre-Anesthetic Checklist: , timeout performed,  Correct Patient, Correct Site, Correct Laterality,  Correct Procedure, Correct Position, site marked,  Risks and benefits discussed,  Pre-op evaluation,  At surgeon's request and post-op pain management  Laterality: Left  Prep: Maximum Sterile Barrier Precautions used, chloraprep       Needles:  Injection technique: Single-shot  Needle Type: Echogenic Stimulator Needle     Needle Length: 9cm  Needle Gauge: 21     Additional Needles:   Procedures:,,,, ultrasound used (permanent image in chart),,    Narrative:  Start time: 06/13/2023 9:00 AM End time: 06/13/2023 9:02 AM Injection made incrementally with aspirations every 5 mL. Anesthesiologist: Elmer Picker, MD

## 2023-06-13 NOTE — Anesthesia Procedure Notes (Addendum)
Spinal  Patient location during procedure: OR Start time: 06/13/2023 9:55 AM End time: 06/13/2023 9:57 AM Reason for block: surgical anesthesia Staffing Performed: anesthesiologist  Anesthesiologist: Elmer Picker, MD Performed by: Elmer Picker, MD Authorized by: Elmer Picker, MD   Preanesthetic Checklist Completed: patient identified, IV checked, risks and benefits discussed, surgical consent, monitors and equipment checked, pre-op evaluation and timeout performed Spinal Block Patient position: sitting Prep: DuraPrep and site prepped and draped Patient monitoring: cardiac monitor, continuous pulse ox and blood pressure Approach: midline Location: L3-4 Injection technique: single-shot Needle Needle type: Pencan  Needle gauge: 24 G Needle length: 9 cm Assessment Sensory level: T6 Events: CSF return Additional Notes Functioning IV was confirmed and monitors were applied. Sterile prep and drape, including hand hygiene and sterile gloves were used. The patient was positioned and the spine was prepped. The skin was anesthetized with lidocaine.  Free flow of clear CSF was obtained prior to injecting local anesthetic into the CSF.  The spinal needle aspirated freely following injection.  The needle was carefully withdrawn.  The patient tolerated the procedure well.

## 2023-06-13 NOTE — Plan of Care (Signed)
  Problem: Education: Goal: Knowledge of General Education information will improve Description: Including pain rating scale, medication(s)/side effects and non-pharmacologic comfort measures Outcome: Progressing   Problem: Health Behavior/Discharge Planning: Goal: Ability to manage health-related needs will improve Outcome: Progressing   Problem: Clinical Measurements: Goal: Ability to maintain clinical measurements within normal limits will improve Outcome: Progressing Goal: Will remain free from infection Outcome: Progressing Goal: Diagnostic test results will improve Outcome: Progressing Goal: Respiratory complications will improve Outcome: Progressing Goal: Cardiovascular complication will be avoided Outcome: Progressing   Problem: Activity: Goal: Risk for activity intolerance will decrease Outcome: Progressing   Problem: Nutrition: Goal: Adequate nutrition will be maintained Outcome: Progressing   Problem: Coping: Goal: Level of anxiety will decrease Outcome: Progressing   Problem: Elimination: Goal: Will not experience complications related to bowel motility Outcome: Progressing Goal: Will not experience complications related to urinary retention Outcome: Progressing   Problem: Pain Management: Goal: General experience of comfort will improve Outcome: Progressing   Problem: Safety: Goal: Ability to remain free from injury will improve Outcome: Progressing   Problem: Skin Integrity: Goal: Risk for impaired skin integrity will decrease Outcome: Progressing   Problem: Education: Goal: Knowledge of the prescribed therapeutic regimen will improve Outcome: Progressing Goal: Individualized Educational Video(s) Outcome: Progressing   Problem: Activity: Goal: Ability to avoid complications of mobility impairment will improve Outcome: Progressing Goal: Range of joint motion will improve Outcome: Progressing   Problem: Clinical Measurements: Goal:  Postoperative complications will be avoided or minimized Outcome: Progressing   Problem: Pain Management: Goal: Pain level will decrease with appropriate interventions Outcome: Progressing   Problem: Skin Integrity: Goal: Will show signs of wound healing Outcome: Progressing

## 2023-06-14 ENCOUNTER — Encounter (HOSPITAL_COMMUNITY): Payer: Self-pay | Admitting: Orthopaedic Surgery

## 2023-06-14 ENCOUNTER — Other Ambulatory Visit (HOSPITAL_BASED_OUTPATIENT_CLINIC_OR_DEPARTMENT_OTHER): Payer: Self-pay

## 2023-06-14 DIAGNOSIS — M1712 Unilateral primary osteoarthritis, left knee: Secondary | ICD-10-CM | POA: Diagnosis not present

## 2023-06-14 DIAGNOSIS — Z96652 Presence of left artificial knee joint: Secondary | ICD-10-CM | POA: Diagnosis not present

## 2023-06-14 MED ORDER — TRAMADOL HCL 50 MG PO TABS
50.0000 mg | ORAL_TABLET | Freq: Three times a day (TID) | ORAL | 0 refills | Status: DC | PRN
  Filled 2023-06-14: qty 60, 10d supply, fill #0

## 2023-06-14 MED ORDER — HYDROXYZINE HCL 50 MG/ML IM SOLN
50.0000 mg | Freq: Once | INTRAMUSCULAR | Status: AC
  Administered 2023-06-14: 50 mg via INTRAMUSCULAR
  Filled 2023-06-14: qty 1

## 2023-06-14 NOTE — Plan of Care (Signed)
completed

## 2023-06-14 NOTE — Progress Notes (Signed)
   Subjective:  Patient reports pain as mild.   Main issue is nausea.  Objective:   VITALS:   Vitals:   06/13/23 1958 06/13/23 2356 06/14/23 0521 06/14/23 0818  BP: (!) 101/54 (!) 97/51 128/63 122/62  Pulse: 85 81 90 87  Resp: 18 18 18 16   Temp: 97.8 F (36.6 C) 97.6 F (36.4 C) 98 F (36.7 C) 98.4 F (36.9 C)  TempSrc: Oral Oral Oral Oral  SpO2: 99% 100% 100% 100%  Weight:      Height:        Neurovascular intact Sensation intact distally Intact pulses distally Dorsiflexion/Plantar flexion intact Incision: dressing C/D/I and no drainage   Lab Results  Component Value Date   WBC 7.3 06/07/2023   HGB 12.7 06/07/2023   HCT 40.3 06/07/2023   MCV 91.0 06/07/2023   PLT 441 (H) 06/07/2023     Assessment/Plan:  1 Day Post-Op   - Expected postop acute blood loss anemia - ambulating well - DVT ppx - SCDs, ambulation, xarelto - WBAT operative extremity - Pain control - can go home from mobility stand point, only issue is nausea which could be due to residual anesthesia or oxy.  Will send tramadol to pharmacy.    Glee Arvin 06/14/2023, 8:37 AM

## 2023-06-14 NOTE — Progress Notes (Signed)
Patient alert and oriented, mae's well, voiding adequate amount of urine, swallowing without difficulty, no c/o pain and feeling better from Nausea at time of discharge. Patient discharged home with family. Script and discharged instructions given to patient. Patient and family stated understanding of instructions given. Patient has an appointment with Roda Shutters in 2 weeks

## 2023-06-14 NOTE — Evaluation (Signed)
Occupational Therapy Evaluation Patient Details Name: Kendra Le MRN: [id number] DOB: [birth year] Today's Date: 06/14/2023   History of Present Illness Pt is a 67 y.o. F who presents for L TKA 06/13/2023. Significant PMH: none.   Clinical Impression   Patient admitted for the procedure above.  Patient is doing quite well, needing very minimal assist for lower body dressing.  Patient should progress quickly and no post acute OT is anticipated.  Recommend follow up as prescribed by MD.  No further OT needs in the acute setting.        If plan is discharge home, recommend the following: Assist for transportation    Functional Status Assessment  Patient has had a recent decline in their functional status and demonstrates the ability to make significant improvements in function in a reasonable and predictable amount of time.  Equipment Recommendations  None recommended by OT    Recommendations for Other Services       Precautions / Restrictions Precautions Precautions: None Restrictions Weight Bearing Restrictions Per Provider Order: Yes LLE Weight Bearing Per Provider Order: Weight bearing as tolerated      Mobility Bed Mobility Overal bed mobility: Modified Independent                  Transfers Overall transfer level: Modified independent                        Balance Overall balance assessment: Mild deficits observed, not formally tested                                         ADL either performed or assessed with clinical judgement   ADL Overall ADL's : Modified independent                                             Vision Patient Visual Report: No change from baseline       Perception Perception: Not tested       Praxis Praxis: Not tested       Pertinent Vitals/Pain Pain Assessment Pain Assessment: Faces Faces Pain Scale: Hurts a little bit Pain Location: L knee Pain Descriptors /  Indicators: Operative site guarding Pain Intervention(s): Monitored during session     Extremity/Trunk Assessment Upper Extremity Assessment Upper Extremity Assessment: Overall WFL for tasks assessed   Lower Extremity Assessment Lower Extremity Assessment: Defer to PT evaluation   Cervical / Trunk Assessment Cervical / Trunk Assessment: Normal   Communication Communication Communication: No apparent difficulties   Cognition Arousal: Alert Behavior During Therapy: WFL for tasks assessed/performed Overall Cognitive Status: Within Functional Limits for tasks assessed                                       General Comments   VSS on RA    Exercises     Shoulder Instructions      Home Living Family/patient expects to be discharged to:: Private residence Living Arrangements: Spouse/significant other Available Help at Discharge: Family Type of Home: House Home Access: Stairs to enter   Entrance Stairs-Rails: None Home Layout: One level     Bathroom Shower/Tub: Psychologist, counselling  Bathroom Toilet: Pharmacist, community: Yes How Accessible: Accessible via walker Home Equipment: Agricultural consultant (2 wheels);BSC/3in1;Shower seat          Prior Functioning/Environment Prior Level of Function : Independent/Modified Independent;Driving                        OT Problem List: Pain      OT Treatment/Interventions:      OT Goals(Current goals can be found in the care plan section) Acute Rehab OT Goals Patient Stated Goal: Return home OT Goal Formulation: With patient Time For Goal Achievement: 06/17/23 Potential to Achieve Goals: Good  OT Frequency:      Co-evaluation              AM-PAC OT "6 Clicks" Daily Activity     Outcome Measure Help from another person eating meals?: None Help from another person taking care of personal grooming?: None Help from another person toileting, which includes using toliet, bedpan, or urinal?:  None Help from another person bathing (including washing, rinsing, drying)?: None Help from another person to put on and taking off regular upper body clothing?: None Help from another person to put on and taking off regular lower body clothing?: A Little 6 Click Score: 23   End of Session Equipment Utilized During Treatment: Rolling walker (2 wheels) Nurse Communication: Mobility status  Activity Tolerance: Patient tolerated treatment well Patient left: in bed;with call bell/phone within reach  OT Visit Diagnosis: Pain Pain - Right/Left: Left Pain - part of body: Knee                Time: 9811-9147 OT Time Calculation (min): 19 min Charges:  OT General Charges $OT Visit: 1 Visit OT Evaluation $OT Eval Moderate Complexity: 1 Mod  06/14/2023  RP, OTR/L  Acute Rehabilitation Services  Office:  [phone]   Suzanna Obey 06/14/2023, 10:51 AM

## 2023-06-14 NOTE — Progress Notes (Signed)
Physical Therapy Treatment Patient Details Name: Kendra Le MRN: [id number] DOB: 08-19-1955 Today's Date: 06/14/2023   History of Present Illness Pt is a 67 y.o. F who presents for L TKA 06/13/2023. Significant PMH: none.    PT Comments  Pt mobilizing very well and has excellent strength in LLE for POD #1.   She was nauseated and did vomit during session however she still wants to DC home today.        If plan is discharge home, recommend the following:     Can travel by private vehicle        Equipment Recommendations  None recommended by PT    Recommendations for Other Services       Precautions / Restrictions Precautions Precautions: None Restrictions Weight Bearing Restrictions Per Provider Order: Yes LLE Weight Bearing Per Provider Order: Weight bearing as tolerated     Mobility  Bed Mobility Overal bed mobility:  (NT, pt up in recliner)                  Transfers Overall transfer level: Needs assistance Equipment used: Rolling walker (2 wheels) Transfers: Sit to/from Stand Sit to Stand: Supervision           General transfer comment: Used correct technique without cueing    Ambulation/Gait Ambulation/Gait assistance: Supervision Gait Distance (Feet): 100 Feet Assistive device: Rolling walker (2 wheels) Gait Pattern/deviations: Step-through pattern, Decreased stance time - left, Decreased weight shift to left       General Gait Details: no balance losses. Pt vomitted after completing stair training therefore gait distance was cut short and pt returned to room in her recliner.   Stairs Stairs: Yes Stairs assistance: Contact guard assist Stair Management: One rail Right, Step to pattern, Forwards Number of Stairs: 3     Wheelchair Mobility     Tilt Bed    Modified Rankin (Stroke Patients Only)       Balance                                            Cognition Arousal: Alert Behavior During Therapy: WFL  for tasks assessed/performed Overall Cognitive Status: Within Functional Limits for tasks assessed                                          Exercises Total Joint Exercises Ankle Circles/Pumps: AROM, Both, 5 reps, Seated Quad Sets: Left, 5 reps, Supine Short Arc Quad: AROM, Left, Supine Heel Slides: AAROM, Left, 10 reps, Supine Hip ABduction/ADduction: AROM, Left, 5 reps, Supine Straight Leg Raises: AROM, Left, 10 reps, Supine Long Arc Quad: AROM, Left, 10 reps, Seated Knee Flexion: AROM, Left, Seated Goniometric ROM: 92 degrees AA flexion    General Comments General comments (skin integrity, edema, etc.): Pt's husband present for majority of session.      Pertinent Vitals/Pain Pain Assessment Pain Assessment: 0-10 Pain Score: 5  Pain Location: L knee Pain Descriptors / Indicators: Sore Pain Intervention(s): Limited activity within patient's tolerance, Monitored during session    Home Living Family/patient expects to be discharged to:: Private residence Living Arrangements: Spouse/significant other Available Help at Discharge: Family Type of Home: House Home Access: Stairs to enter Entrance Stairs-Rails: None     Home Layout: One level Home Equipment:  Rolling Walker (2 wheels);BSC/3in1;Shower seat      Prior Function            PT Goals (current goals can now be found in the care plan section) Progress towards PT goals: Progressing toward goals    Frequency    7X/week      PT Plan      Co-evaluation              AM-PAC PT "6 Clicks" Mobility   Outcome Measure  Help needed turning from your back to your side while in a flat bed without using bedrails?: None Help needed moving from lying on your back to sitting on the side of a flat bed without using bedrails?: None Help needed moving to and from a bed to a chair (including a wheelchair)?: A Little Help needed standing up from a chair using your arms (e.g., wheelchair or bedside  chair)?: A Little Help needed to walk in hospital room?: A Little Help needed climbing 3-5 steps with a railing? : A Little 6 Click Score: 20    End of Session Equipment Utilized During Treatment: Gait belt Activity Tolerance: Treatment limited secondary to medical complications (Comment) (Vomitted  after stair training) Patient left: in chair;with family/visitor present;with call bell/phone within reach Nurse Communication: Other (comment) (Vomitting) PT Visit Diagnosis: Other abnormalities of gait and mobility (R26.89);Pain Pain - Right/Left: Left Pain - part of body: Knee     Time: 0827-0910 PT Time Calculation (min) (ACUTE ONLY): 43 min  Charges:    $Gait Training: 23-37 mins $Therapeutic Exercise: 8-22 mins PT General Charges $$ ACUTE PT VISIT: 1 Visit                     Lavona Mound, PT   Acute Rehabilitation Services  Office [phone] 06/14/2023    [name] 06/14/2023, 10:52 AM

## 2023-06-14 NOTE — Discharge Summary (Signed)
Patient ID: CICLALI DECOUX MRN: [id number] DOB/AGE: 12-17-1955 67 y.o.  Admit date: 06/13/2023 Discharge date: 06/14/2023  Admission Diagnoses:  Primary osteoarthritis of left knee  Discharge Diagnoses:  Principal Problem:   Primary osteoarthritis of left knee Active Problems:   Status post total left knee replacement   Past Medical History:  Diagnosis Date   Arthritis    Atypical nevus 05/23/2000   Left Upper Arm-Mild   Atypical nevus 05/25/2006   Right Calf-Moderate   Atypical nevus 01/23/2010   Left Upper Inner Arm-Mild   Atypical nevus 08/06/2015   Left Forearm-Mild   BCC (basal cell carcinoma of skin) 03/31/2004   Right Lower Back (tx p bx)   GERD (gastroesophageal reflux disease)    History of kidney stones 2010   Migraine    PONV (postoperative nausea and vomiting)    Superficial basal cell carcinoma (BCC) 10/15/1999   Lower Post Neck, Left Mid Back,Left Lower Back and Upper Sternum (all Cx3,5FU)   Superficial basal cell carcinoma (BCC) 03/01/2014   Left Post Shoulder (Cx3,5FU)   Superficial basal cell carcinoma (BCC) 06/26/2014   Right Shoulder (Cx3,5FU)   Superficial basal cell carcinoma (BCC) 06/28/2017   Sup Upper Sternum (tx p bx)    Surgeries: Procedure(s): LEFT TOTAL KNEE ARTHROPLASTY on 06/13/2023   Consultants (if any):   Discharged Condition: Improved  Hospital Course: Kendra Le is an 67 y.o. female who was admitted 06/13/2023 with a diagnosis of Primary osteoarthritis of left knee and went to the operating room on 06/13/2023 and underwent the above named procedures.    She was given perioperative antibiotics:  Anti-infectives (From admission, onward)    Start     Dose/Rate Route Frequency Ordered Stop   06/13/23 1600  ceFAZolin (ANCEF) IVPB 2g/100 mL premix        2 g 200 mL/hr over 30 Minutes Intravenous Every 6 hours 06/13/23 1242 06/13/23 2147   06/13/23 1034  vancomycin (VANCOCIN) powder  Status:  Discontinued          As  needed 06/13/23 1034 06/13/23 1159   06/13/23 0845  ceFAZolin (ANCEF) IVPB 2g/100 mL premix        2 g 200 mL/hr over 30 Minutes Intravenous On call to O.R. 06/13/23 0810 06/13/23 0957   06/13/23 0814  ceFAZolin (ANCEF) 2-4 GM/100ML-% IVPB       Note to Pharmacy: Kandice Hams D: cabinet override      06/13/23 0814 06/13/23 1003     .  She was given sequential compression devices, early ambulation, and appropriate chemoprophylaxis for DVT prophylaxis.  She benefited maximally from the hospital stay and there were no complications.    Recent vital signs:  Vitals:   06/14/23 0521 06/14/23 0818  BP: 128/63 122/62  Pulse: 90 87  Resp: 18 16  Temp: 98 F (36.7 C) 98.4 F (36.9 C)  SpO2: 100% 100%    Recent laboratory studies:  Lab Results  Component Value Date   HGB 12.7 06/07/2023   HGB 11.6 (L) 05/21/2011   Lab Results  Component Value Date   WBC 7.3 06/07/2023   PLT 441 (H) 06/07/2023   No results found for: "INR" Lab Results  Component Value Date   NA 137 06/07/2023   K 3.9 06/07/2023   CL 104 06/07/2023   CO2 25 06/07/2023   BUN 14 06/07/2023   CREATININE 0.83 06/07/2023   GLUCOSE 96 06/07/2023    Discharge Medications:   Allergies as of 06/14/2023  Reactions   Erythromycin    Latex Other (See Comments)   Other   Penicillins Nausea And Vomiting        Medication List     STOP taking these medications    diclofenac Sodium 2 % Soln Commonly known as: Pennsaid       TAKE these medications    benzonatate 100 MG capsule Commonly known as: Tessalon Perles Take 1 capsule (100 mg total) by mouth 3 (three) times daily as needed.   betamethasone dipropionate 0.05 % cream Apply to affected area daily. What changed: Another medication with the same name was changed. Make sure you understand how and when to take each.   betamethasone dipropionate 0.05 % cream Apply 1 Application topically daily. To affected area(s) What changed:  when to  take this reasons to take this   calcium-vitamin D 500-200 MG-UNIT tablet Commonly known as: OSCAL WITH D Take 1 tablet by mouth at bedtime.   cetirizine 10 MG tablet Commonly known as: ZYRTEC Take 10 mg by mouth at bedtime.   cyclobenzaprine 5 MG tablet Commonly known as: FLEXERIL Take 1 tablet by mouth at bedtime as needed for muscle spasm   cyclobenzaprine 5 MG tablet Commonly known as: FLEXERIL Take 1 tablet (5 mg total) by mouth at bedtime as needed for muscle spasms   docusate sodium 100 MG capsule Commonly known as: Colace Take 1 capsule (100 mg total) by mouth daily as needed.   estradiol 0.025 MG/24HR Commonly known as: VIVELLE-DOT APPLY 1 PATCH ON TO THE SKIN TWICE WEEKLY   estradiol 0.025 MG/24HR Commonly known as: VIVELLE-DOT Place 1 patch onto the skin 2 (two) times a week.   estradiol 0.025 MG/24HR Commonly known as: VIVELLE-DOT Place 1 patch onto the skin 2 (two) times a week.   meclizine 12.5 MG tablet Commonly known as: ANTIVERT Take 1/2-1 tablet by mouth as needed every 12 hrs for vertigo   meclizine 12.5 MG tablet Commonly known as: ANTIVERT Take 0.5-1 tablets (6.25-12.5 mg total) by mouth every 12 (twelve) hours as needed for vertigo   methocarbamol 750 MG tablet Commonly known as: Robaxin-750 Take 1 tablet (750 mg total) by mouth 2 (two) times daily as needed for muscle spasms.   multivitamins ther. w/minerals Tabs tablet Take 1 tablet by mouth daily.   ondansetron 4 MG tablet Commonly known as: Zofran Take 1 tablet (4 mg total) by mouth every 8 (eight) hours as needed for nausea or vomiting.   oxyCODONE-acetaminophen 5-325 MG tablet Commonly known as: Percocet Take 1-2 tablets by mouth every 6 (six) hours as needed. To be taken after surgery   pantoprazole 40 MG tablet Commonly known as: PROTONIX Take 1 tablet (40 mg total) by mouth daily 20 minutes before breakfast.   rOPINIRole 0.5 MG tablet Commonly known as: REQUIP Take 1  tablet by mouth 1 to 3 hours before bedtime for restless leg 30 day(s)   rOPINIRole 1 MG tablet Commonly known as: REQUIP Take 1 tablet (1 mg total) by mouth 1 to 3 hours before bedtime for restless leg.   rOPINIRole 1 MG tablet Commonly known as: REQUIP Take 1 tablet (1 mg total) by mouth 1 to 3 hours before bedtime once daily for restless legs.   scopolamine 1 MG/3DAYS Commonly known as: TRANSDERM-SCOP Place 1 patch onto the skin every 3 (three) days as needed.   traMADol 50 MG tablet Commonly known as: ULTRAM Take 1-2 tablets (50-100 mg total) by mouth 3 (three) times daily as needed.  Xarelto 10 MG Tabs tablet Generic drug: rivaroxaban Take 1 tablet (10 mg total) by mouth daily. To be taken after surgery to prevent  blood clots               Durable Medical Equipment  (From admission, onward)           Start     Ordered   06/13/23 1243  DME Walker rolling  Once       Question Answer Comment  Walker: With 5 Inch Wheels   Patient needs a walker to treat with the following condition Status post left partial knee replacement      06/13/23 1242   06/13/23 1243  DME 3 n 1  Once        06/13/23 1242   06/13/23 1243  DME Bedside commode  Once       Question:  Patient needs a bedside commode to treat with the following condition  Answer:  Status post left partial knee replacement   06/13/23 1242            Diagnostic Studies: DG Knee Left Port Result Date: 06/13/2023 CLINICAL DATA:  Left total knee arthroplasty. EXAM: PORTABLE LEFT KNEE - 1-2 VIEW COMPARISON:  None Available. FINDINGS: Left knee arthroplasty in expected alignment. No periprosthetic lucency or fracture. There has been patellar resurfacing. Recent postsurgical change includes air and edema in the soft tissues and joint space. IMPRESSION: Left knee arthroplasty without immediate postoperative complication. Electronically Signed   By: Narda Rutherford M.D.   On: 06/13/2023 14:22    Disposition:       Follow-up Information     [facility] Follow up.   Why: The home health agency will contact you for the first home visit. Contact information: [address] [place] [address] [phone]                  Signed: Glee Arvin 06/14/2023, 8:41 AM

## 2023-06-14 NOTE — TOC Transition Note (Signed)
Transition of Care [facility]) - Discharge Note   Patient Details  Name: Kendra Le MRN: [id number] Date of Birth: 03-24-1956  Transition of Care [facility]) CM/SW Contact:  Gordy Clement, RN Phone Number: 06/14/2023, 2:12 PM   Clinical Narrative:     Patient to DC to home today [facility] and DME were arranged per op. No additional TOC needs           Patient Goals and CMS Choice            Discharge Placement                       Discharge Plan and Services Additional resources added to the After Visit Summary for                                       Social Drivers of Health (SDOH) Interventions SDOH Screenings   Tobacco Use: Low Risk  (06/13/2023)     Readmission Risk Interventions     No data to display

## 2023-06-16 DIAGNOSIS — Z7901 Long term (current) use of anticoagulants: Secondary | ICD-10-CM | POA: Diagnosis not present

## 2023-06-16 DIAGNOSIS — Z79891 Long term (current) use of opiate analgesic: Secondary | ICD-10-CM | POA: Diagnosis not present

## 2023-06-16 DIAGNOSIS — K219 Gastro-esophageal reflux disease without esophagitis: Secondary | ICD-10-CM | POA: Diagnosis not present

## 2023-06-16 DIAGNOSIS — Z96652 Presence of left artificial knee joint: Secondary | ICD-10-CM | POA: Diagnosis not present

## 2023-06-16 DIAGNOSIS — Z471 Aftercare following joint replacement surgery: Secondary | ICD-10-CM | POA: Diagnosis not present

## 2023-06-16 NOTE — Anesthesia Postprocedure Evaluation (Signed)
Anesthesia Post Note  Patient: Kendra Le  Procedure(s) Performed: LEFT TOTAL KNEE ARTHROPLASTY (Left: Knee)     Patient location during evaluation: PACU Anesthesia Type: Regional and Spinal Level of consciousness: oriented and awake and alert Pain management: pain level controlled Vital Signs Assessment: post-procedure vital signs reviewed and stable Respiratory status: spontaneous breathing, respiratory function stable and patient connected to nasal cannula oxygen Cardiovascular status: blood pressure returned to baseline and stable Postop Assessment: no headache, no backache and no apparent nausea or vomiting Anesthetic complications: no  No notable events documented.  Last Vitals:  Vitals:   06/14/23 0521 06/14/23 0818  BP: 128/63 122/62  Pulse: 90 87  Resp: 18 16  Temp: 36.7 C 36.9 C  SpO2: 100% 100%    Last Pain:  Vitals:   06/14/23 0818  TempSrc: Oral  PainSc:    Pain Goal: Patients Stated Pain Goal: 4 (06/14/23 0022)                 Olon Russ L Alverto Shedd

## 2023-06-16 NOTE — Therapy (Addendum)
 OUTPATIENT PHYSICAL THERAPY LOWER EXTREMITY EVALUATION   Patient Name: Kendra Le MRN: [id number] DOB:1955-09-26, 67 y.o., female Today's Date: 06/29/2023   END OF SESSION:  PT End of Session - 06/29/23 1017     Visit Number 1    Date for PT Re-Evaluation 08/10/23    Authorization Type [employer]    PT Start Time 1017    PT Stop Time 1107    PT Time Calculation (min) 50 min    Activity Tolerance Patient tolerated treatment well    Behavior During Therapy [facility] for tasks assessed/performed             Past Medical History:  Diagnosis Date   Arthritis    Atypical nevus 05/23/2000   Left Upper Arm-Mild   Atypical nevus 05/25/2006   Right Calf-Moderate   Atypical nevus 01/23/2010   Left Upper Inner Arm-Mild   Atypical nevus 08/06/2015   Left Forearm-Mild   BCC (basal cell carcinoma of skin) 03/31/2004   Right Lower Back (tx p bx)   GERD (gastroesophageal reflux disease)    History of kidney stones 2010   Migraine    PONV (postoperative nausea and vomiting)    Superficial basal cell carcinoma (BCC) 10/15/1999   Lower Post Neck, Left Mid Back,Left Lower Back and Upper Sternum (all Cx3,5FU)   Superficial basal cell carcinoma (BCC) 03/01/2014   Left Post Shoulder (Cx3,5FU)   Superficial basal cell carcinoma (BCC) 06/26/2014   Right Shoulder (Cx3,5FU)   Superficial basal cell carcinoma (BCC) 06/28/2017   Sup Upper Sternum (tx p bx)   Past Surgical History:  Procedure Laterality Date   ABDOMINAL HYSTERECTOMY     ANTERIOR CRUCIATE LIGAMENT REPAIR     left   BREAST SURGERY     CHOLECYSTECTOMY     TOTAL KNEE ARTHROPLASTY Left 06/13/2023   Procedure: LEFT TOTAL KNEE ARTHROPLASTY;  Surgeon: Jerri Kay HERO, MD;  Location: [facility] OR;  Service: Orthopedics;  Laterality: Left;   Patient Active Problem List   Diagnosis Date Noted   Status post total left knee replacement 06/13/2023   Primary osteoarthritis of right knee 09/01/2021   Primary osteoarthritis of left knee  09/01/2021   Ingrown toenail 12/21/2019   Left hip pain 09/04/2017   Right shoulder pain 09/04/2017   Arthralgia of right temporomandibular joint 11/16/2016   Sensorineural hearing loss (SNHL) of both ears 11/16/2016   Tinnitus of left ear 11/16/2016   Synovial cyst 08/07/2015   Trochanteric bursitis of both hips 10/22/2014   Plantar fasciitis, left 12/24/2013   Left knee pain 10/15/2011    PCP: Arloa Elsie SAUNDERS, MD   REFERRING PROVIDER: Jerri Kay HERO, MD   REFERRING DIAG: [phone] (ICD-10-CM) - Primary osteoarthritis of left knee   THERAPY DIAG:  Acute pain of left knee  Stiffness of left knee, not elsewhere classified  Muscle weakness (generalized)  Other abnormalities of gait and mobility  Localized edema  RATIONALE FOR EVALUATION AND TREATMENT: Rehabilitation  ONSET DATE: 06/13/23 - L TKA  NEXT MD VISIT: 07/26/23   SUBJECTIVE:  SUBJECTIVE STATEMENT: S/p L TKA on 06/13/23. HH PT completed 06/21/23.  Uses CPM for ~1 hr at time but limited tolerance due to RLS - CPM due to be returned on Monday.  Pt reports she has been moving around well at home and weaned herself to the [facility] as of yesterday.  Pain not too bad during the day but still interferes with sleep.  She notes a popping feeling which the MD told her might be from her patella or her ITB.  Still competing HH PT HEP 2x/day.  PAIN: Are you having pain? Yes: NPRS scale: 5/10 currently, up to 8-9/10 at worst (typically at night) Pain location: L knee Pain description: tight Aggravating factors: end ROM knee flexion Relieving factors: Tylenol  during day, Tylenol  #3 and muscle relaxant at night, ice  PERTINENT HISTORY:  OA - B knees s/p L TKA 06/13/23; B hearing loss; basal cell carcinoma of skin - multiple  locations  PRECAUTIONS: None  RED FLAGS: None  WEIGHT BEARING RESTRICTIONS: No  FALLS:  Has patient fallen in last 6 months? No  LIVING ENVIRONMENT: Lives with: lives with their spouse Lives in: House/apartment Stairs: Yes: External: 2 steps; none Has following equipment at home: Single point cane, Walker - 2 wheeled, and bed side commode  OCCUPATION: Retired  PLOF: Independent and Leisure: walking daily 30-45 min, reading  PATIENT GOALS: Be able to walk normally again and not hurt my other knee.   OBJECTIVE: (objective measures completed at initial evaluation unless otherwise dated)  DIAGNOSTIC FINDINGS:  06/13/23 - DG Left knee: Left knee arthroplasty without immediate postoperative complication.  03/22/23 - XR Left knee: X-rays of the left knee show advanced tricompartmental degenerative joint disease worse in the patellofemoral compartment.   PATIENT SURVEYS:  LEFS 40 / 80 = 50.0 %  COGNITION: Overall cognitive status: Within functional limits for tasks assessed    SENSATION: WFL  EDEMA:  Mild/mod edema in L knee and lower leg  PALPATION: Mildly reduced L patellar mobility, predominantly superiorly/inferiorly  MUSCLE LENGTH:  (TBA as indicated) Hamstrings:  ITB:  Piriformis:  Hip flexors:  Quads:  Heelcord:   LOWER EXTREMITY ROM:  Active ROM Right eval Left eval  Knee flexion 135 105  Knee extension 0 -13   Passive ROM Left eval  Knee flexion 109  Knee extension -4  (Blank rows = not tested)  LOWER EXTREMITY MMT:  MMT Right eval Left eval  Hip flexion 5 4-  Hip extension 4+ 4  Hip abduction 5 4  Hip adduction 5 4-  Hip internal rotation 5 4  Hip external rotation 4+ 3+  Knee flexion 5 4  Knee extension 5 4-  Ankle dorsiflexion 5 5  Ankle plantarflexion    Ankle inversion    Ankle eversion     (Blank rows = not tested)  FUNCTIONAL TESTS:  5 times sit to stand: 16.88 sec Timed up and go (TUG): 13.85 sec with SPC Dynamic Gait  Index: 19/24  GAIT: Distance walked: clinic distances Assistive device utilized: Single point cane Level of assistance: Modified independence Gait pattern: decreased stance time- Left, decreased stride length, decreased hip/knee flexion- Left, and lateral lean- Right   TODAY'S TREATMENT:   06/29/23 - Eval THERAPEUTIC EXERCISE: to improve flexibility, strength and mobility.  Demonstration, verbal and tactile cues throughout for technique.  Reviewed HH HEP, clarifying which exercises to continue and progressing the following exercises: Standing hip abduction - added looped RTB at ankles and instructed pt to perform for B LE  Standing hip extension - added looped RTB at ankles and instructed pt to perform for B LE Supine quad set progressed to standing TKE against ball on wall Clarified alignment and depth with mini-squats, encouraging patient to progress depth of squat as tolerated  SELF CARE:  Provided instruction in L knee patellar mobs to promote improved patellofemoral motion for increased L knee ROM   PATIENT EDUCATION:  Education details: PT eval findings, anticipated POC, initial HEP, HEP review, and L knee patellar mobs   Person educated: Patient Education method: Explanation, Demonstration, Verbal cues, and Handouts Education comprehension: verbalized understanding, returned demonstration, verbal cues required, and needs further education  HOME EXERCISE PROGRAM: Access Code: [email or web address] URL: [email or web address] Date: 06/29/2023 Prepared by: Elijah Hidden  Exercises - Standing Hip Extension with Resistance at Ankles and Counter Support  - 1 x daily - 7 x weekly - 2 sets - 10 reps - 3 sec hold - Standing Hip Abduction with Resistance at Ankles and Counter Support  - 1 x daily - 7 x weekly - 2 sets - 10 reps - 3 sec hold - Standing Terminal Knee Extension at Wall with Ball  - 1 x daily - 7 x weekly - 2 sets - 10 reps - 5 sec hold - Mini Squat with Counter Support   - 1 x daily - 7 x weekly - 2 sets - 10 reps - 3-5 sec hold   ASSESSMENT:  CLINICAL IMPRESSION: Kendra Le is a 67 y.o. female who was referred to physical therapy for evaluation and treatment s/p L TKA on 06/13/23.  She initially received [facility] PT completing episode on 06/21/2023, as well as home CPM unit which will be picked up on Monday.  Patient reports average post-op pain of 5/10 during the day, but up to 8-9/10 at night.  Pain is worse with end ROM L knee flexion, but also likely aggravated by RLS when attempting to sleep at night which also limits her tolerance for CPM use.  Patient has deficits in L knee ROM, B LE flexibility; L LE strength, limited L patellar mobility, and TTP with abnormal muscle tension over L tibialis anterior which are interfering with ADLs and are impacting quality of life.  DGI score of 19/24 indicating high risk for falls.  On LEFS patient scored 40/80 demonstrating PT 50% disability.  Saddie will benefit from skilled PT to address above deficits to improve mobility and activity tolerance with decreased pain interference.  OBJECTIVE IMPAIRMENTS: Abnormal gait, decreased activity tolerance, decreased balance, decreased endurance, decreased knowledge of condition, decreased mobility, difficulty walking, decreased ROM, decreased strength, increased edema, increased fascial restrictions, impaired perceived functional ability, increased muscle spasms, impaired flexibility, improper body mechanics, postural dysfunction, and pain.   ACTIVITY LIMITATIONS: carrying, lifting, bending, sitting, standing, squatting, sleeping, stairs, transfers, bed mobility, bathing, and locomotion level  PARTICIPATION LIMITATIONS: meal prep, cleaning, laundry, driving, shopping, and community activity  PERSONAL FACTORS: Past/current experiences, Time since onset of injury/illness/exacerbation, and 1-2 comorbidities: OA - B knees; B hearing loss; basal cell carcinoma of skin - multiple locations   are also affecting patient's functional outcome.   REHAB POTENTIAL: Excellent  CLINICAL DECISION MAKING: Stable/uncomplicated  EVALUATION COMPLEXITY: Low   GOALS: Goals reviewed with patient? Yes  SHORT TERM GOALS: Target date: 07/20/2023  Patient will be independent with initial HEP. Baseline: Performing HH PT HEP 2x/day - reviewed and modified on eval Goal status: INITIAL  2.  Patient will report at least 25% improvement in L knee pain to  improve QOL. Baseline: 5/10 currently, up to 8-9/10 at worst (typically at night) Goal status: INITIAL  3.  Patient will demonstrate improved L knee AROM to >/= -5-115 to improve gait and stair mechanics. Baseline: L knee AROM -13-105; PROM -4-109 Goal status: INITIAL  LONG TERM GOALS: Target date: 08/10/2023  Patient will be independent with advanced/ongoing HEP to improve outcomes and carryover.  Baseline:  Goal status: INITIAL  2.  Patient will report at least 50-75% improvement in L knee pain to improve QOL. Baseline: 5/10 currently, up to 8-9/10 at worst (typically at night) Goal status: INITIAL  3.  Patient will demonstrate improved L knee AROM to >/= -2-120 to allow for normal gait and stair mechanics. Baseline: L knee AROM -13-105; PROM -4-109 Goal status: INITIAL  4.  Patient will demonstrate improved L LE strength to >/= 4+/5 for improved stability and ease of mobility. Baseline: Refer to above LE MMT table Goal status: INITIAL  5.  Patient will be able to ambulate 600' w/o AD and normal gait pattern without increased pain to access community.  Baseline: Currently ambulating with SPC and mildly antalgic gait pattern Goal status: INITIAL  6. Patient will be able to ascend/descend stairs with 1 HR and reciprocal step pattern safely to access home and community.  Baseline: Mostly step to pattern due to limited L knee ROM and quad control Goal status: INITIAL  7.  Patient will report >/= 50/80 on LEFS to demonstrate  improved functional ability. Baseline: 40 / 80 = 50.0 % Goal status: INITIAL  8.  Patient will demonstrate at least 22/24 on DGI to decrease risk of falls. Baseline: 19/24 Goal status: INITIAL    PLAN:  PT FREQUENCY: 2-3x/week  PT DURATION: 6 weeks  PLANNED INTERVENTIONS: 97164- PT Re-evaluation, 97110-Therapeutic exercises, 97530- Therapeutic activity, 97112- Neuromuscular re-education, 97535- Self Care, [id number]- Manual therapy, [phone]- Gait training, 97014- Electrical stimulation (unattended), [phone]- Electrical stimulation (manual), 97016- Vasopneumatic device, 97035- Ultrasound, Patient/Family education, Balance training, Stair training, Taping, Dry Needling, Joint mobilization, Scar mobilization, DME instructions, Cryotherapy, and Moist heat  PLAN FOR NEXT SESSION: Assess proximal LE flexibility/muscle length and add stretches to HEP as indicated (patient reports history of h/o ITB tightness); progress L knee ROM; progress quad and proximal LE strengthening; MT as indicated to address abnormal muscle tension and improve L knee ROM; modalities including vasopneumatic compression as indicated for pain and edema management   Elijah CHRISTELLA Hidden, PT 06/29/2023, 12:46 PM

## 2023-06-20 ENCOUNTER — Other Ambulatory Visit: Payer: Self-pay | Admitting: Physician Assistant

## 2023-06-20 ENCOUNTER — Telehealth: Payer: Self-pay | Admitting: Physician Assistant

## 2023-06-20 ENCOUNTER — Other Ambulatory Visit (HOSPITAL_BASED_OUTPATIENT_CLINIC_OR_DEPARTMENT_OTHER): Payer: Self-pay

## 2023-06-20 MED ORDER — ACETAMINOPHEN-CODEINE 300-30 MG PO TABS
1.0000 | ORAL_TABLET | Freq: Four times a day (QID) | ORAL | 0 refills | Status: DC | PRN
  Filled 2023-06-20: qty 30, 8d supply, fill #0

## 2023-06-20 NOTE — Telephone Encounter (Signed)
Spoke to patient.  We are going to try tylenol 3.  She will let me know tomorrow if does not help at which point we will try norco

## 2023-06-20 NOTE — Telephone Encounter (Signed)
Pt PT called to request a call from the nurse in regards to which medications would be more helpful for the pt. [phone]

## 2023-06-20 NOTE — Telephone Encounter (Signed)
Can you call patient? I spoke with her and she is not sure what to do. Left TKA 06/13/2023.  Tramadol makes her feel bad.  The nausea meds give her a headache.  She is taking her muscle relaxer.

## 2023-06-24 ENCOUNTER — Other Ambulatory Visit (HOSPITAL_BASED_OUTPATIENT_CLINIC_OR_DEPARTMENT_OTHER): Payer: Self-pay

## 2023-06-27 ENCOUNTER — Other Ambulatory Visit (HOSPITAL_COMMUNITY): Payer: Self-pay

## 2023-06-27 ENCOUNTER — Other Ambulatory Visit: Payer: Self-pay

## 2023-06-27 ENCOUNTER — Other Ambulatory Visit (HOSPITAL_BASED_OUTPATIENT_CLINIC_OR_DEPARTMENT_OTHER): Payer: Self-pay

## 2023-06-28 ENCOUNTER — Other Ambulatory Visit (HOSPITAL_BASED_OUTPATIENT_CLINIC_OR_DEPARTMENT_OTHER): Payer: Self-pay

## 2023-06-28 ENCOUNTER — Ambulatory Visit (INDEPENDENT_AMBULATORY_CARE_PROVIDER_SITE_OTHER): Payer: PPO | Admitting: Physician Assistant

## 2023-06-28 DIAGNOSIS — Z96652 Presence of left artificial knee joint: Secondary | ICD-10-CM

## 2023-06-28 MED ORDER — TRAMADOL HCL 50 MG PO TABS
50.0000 mg | ORAL_TABLET | Freq: Three times a day (TID) | ORAL | 0 refills | Status: AC | PRN
  Filled 2023-06-28: qty 60, 10d supply, fill #0

## 2023-06-28 MED ORDER — ROPINIROLE HCL 0.5 MG PO TABS
0.5000 mg | ORAL_TABLET | Freq: Every day | ORAL | 3 refills | Status: DC
  Filled 2023-06-28: qty 30, 30d supply, fill #0
  Filled 2023-07-24: qty 30, 30d supply, fill #1
  Filled 2023-08-23: qty 30, 30d supply, fill #2
  Filled 2023-09-22: qty 30, 30d supply, fill #3

## 2023-06-28 MED ORDER — ACETAMINOPHEN-CODEINE 300-30 MG PO TABS
1.0000 | ORAL_TABLET | Freq: Every evening | ORAL | 0 refills | Status: AC | PRN
  Filled 2023-06-28: qty 30, 15d supply, fill #0

## 2023-06-28 MED ORDER — METHOCARBAMOL 750 MG PO TABS
750.0000 mg | ORAL_TABLET | Freq: Two times a day (BID) | ORAL | 2 refills | Status: AC | PRN
  Filled 2023-06-28: qty 20, 10d supply, fill #0

## 2023-06-28 NOTE — Progress Notes (Signed)
   Post-Op Visit Note   Patient: Kendra Le           Date of Birth: 1955-07-24           MRN: [id number] Visit Date: 06/28/2023 PCP: Arloa Elsie SAUNDERS, MD   Assessment & Plan:  Chief Complaint:  Chief Complaint  Patient presents with   Left Knee - Routine Post Op    06/13/23 LEFT TKA    Visit Diagnoses:  1. Status post total left knee replacement     Plan: Patient is a pleasant 68 year old female who comes in today 3 weeks status post left total knee replacement 06/13/2023.  She has been doing relatively well.  She has been taking Tylenol  3 and tramadol  as well as Robaxin  for pain.  She has been compliant taking Xarelto  for DVT prophylaxis.  She has been getting home health physical therapy.  Examination of the left knee reveals a well-healing surgical incision with nylon sutures in place.  No evidence of infection or cellulitis.  Calves are soft nontender.  She is neurovascularly intact distally.  Today, sutures were removed and Steri-Strips applied.  She is already scheduled to start outpatient physical therapy tomorrow.  I refilled her tramadol  as well as her Tylenol  3 for which she takes at night. She will finish the rest of her Xarelto  and then transition to a baby aspirin  twice daily for 2 weeks for DVT prophylaxis.  Follow-up with us  in 4 weeks for repeat evaluation and 2 view x-rays of the left knee.  Call with concerns or questions.

## 2023-06-29 ENCOUNTER — Ambulatory Visit: Payer: Medicare HMO | Attending: Orthopaedic Surgery | Admitting: Physical Therapy

## 2023-06-29 ENCOUNTER — Encounter: Payer: Self-pay | Admitting: Physical Therapy

## 2023-06-29 ENCOUNTER — Other Ambulatory Visit: Payer: Self-pay

## 2023-06-29 DIAGNOSIS — M25562 Pain in left knee: Secondary | ICD-10-CM | POA: Diagnosis not present

## 2023-06-29 DIAGNOSIS — M6281 Muscle weakness (generalized): Secondary | ICD-10-CM | POA: Diagnosis not present

## 2023-06-29 DIAGNOSIS — R600 Localized edema: Secondary | ICD-10-CM | POA: Insufficient documentation

## 2023-06-29 DIAGNOSIS — R2689 Other abnormalities of gait and mobility: Secondary | ICD-10-CM | POA: Diagnosis not present

## 2023-06-29 DIAGNOSIS — M1712 Unilateral primary osteoarthritis, left knee: Secondary | ICD-10-CM | POA: Diagnosis not present

## 2023-06-29 DIAGNOSIS — M25662 Stiffness of left knee, not elsewhere classified: Secondary | ICD-10-CM | POA: Diagnosis not present

## 2023-07-01 ENCOUNTER — Encounter: Payer: Self-pay | Admitting: Physical Therapy

## 2023-07-01 ENCOUNTER — Ambulatory Visit: Payer: Medicare HMO | Admitting: Physical Therapy

## 2023-07-01 DIAGNOSIS — M25562 Pain in left knee: Secondary | ICD-10-CM

## 2023-07-01 DIAGNOSIS — R2689 Other abnormalities of gait and mobility: Secondary | ICD-10-CM | POA: Diagnosis not present

## 2023-07-01 DIAGNOSIS — R600 Localized edema: Secondary | ICD-10-CM | POA: Diagnosis not present

## 2023-07-01 DIAGNOSIS — M25662 Stiffness of left knee, not elsewhere classified: Secondary | ICD-10-CM

## 2023-07-01 DIAGNOSIS — M6281 Muscle weakness (generalized): Secondary | ICD-10-CM | POA: Diagnosis not present

## 2023-07-01 DIAGNOSIS — M1712 Unilateral primary osteoarthritis, left knee: Secondary | ICD-10-CM | POA: Diagnosis not present

## 2023-07-01 NOTE — Therapy (Signed)
 OUTPATIENT PHYSICAL THERAPY TREATMENT   Patient Name: Kendra Le MRN: [id number] DOB:1955/11/22, 68 y.o., female Today's Date: 07/01/2023   END OF SESSION:  PT End of Session - 07/01/23 0805     Visit Number 2    Date for PT Re-Evaluation 08/10/23    Authorization Type [employer]    PT Start Time 0805    PT Stop Time 0855    PT Time Calculation (min) 50 min    Activity Tolerance Patient tolerated treatment well    Behavior During Therapy [facility] for tasks assessed/performed             Past Medical History:  Diagnosis Date   Arthritis    Atypical nevus 05/23/2000   Left Upper Arm-Mild   Atypical nevus 05/25/2006   Right Calf-Moderate   Atypical nevus 01/23/2010   Left Upper Inner Arm-Mild   Atypical nevus 08/06/2015   Left Forearm-Mild   BCC (basal cell carcinoma of skin) 03/31/2004   Right Lower Back (tx p bx)   GERD (gastroesophageal reflux disease)    History of kidney stones 2010   Migraine    PONV (postoperative nausea and vomiting)    Superficial basal cell carcinoma (BCC) 10/15/1999   Lower Post Neck, Left Mid Back,Left Lower Back and Upper Sternum (all Cx3,5FU)   Superficial basal cell carcinoma (BCC) 03/01/2014   Left Post Shoulder (Cx3,5FU)   Superficial basal cell carcinoma (BCC) 06/26/2014   Right Shoulder (Cx3,5FU)   Superficial basal cell carcinoma (BCC) 06/28/2017   Sup Upper Sternum (tx p bx)   Past Surgical History:  Procedure Laterality Date   ABDOMINAL HYSTERECTOMY     ANTERIOR CRUCIATE LIGAMENT REPAIR     left   BREAST SURGERY     CHOLECYSTECTOMY     TOTAL KNEE ARTHROPLASTY Left 06/13/2023   Procedure: LEFT TOTAL KNEE ARTHROPLASTY;  Surgeon: Jerri Kay HERO, MD;  Location: [facility] OR;  Service: Orthopedics;  Laterality: Left;   Patient Active Problem List   Diagnosis Date Noted   Status post total left knee replacement 06/13/2023   Primary osteoarthritis of right knee 09/01/2021   Primary osteoarthritis of left knee 09/01/2021    Ingrown toenail 12/21/2019   Left hip pain 09/04/2017   Right shoulder pain 09/04/2017   Arthralgia of right temporomandibular joint 11/16/2016   Sensorineural hearing loss (SNHL) of both ears 11/16/2016   Tinnitus of left ear 11/16/2016   Synovial cyst 08/07/2015   Trochanteric bursitis of both hips 10/22/2014   Plantar fasciitis, left 12/24/2013   Left knee pain 10/15/2011    PCP: Arloa Elsie SAUNDERS, MD   REFERRING PROVIDER: Jerri Kay HERO, MD   REFERRING DIAG: [phone] (ICD-10-CM) - Primary osteoarthritis of left knee   THERAPY DIAG:  Acute pain of left knee  Stiffness of left knee, not elsewhere classified  Muscle weakness (generalized)  Other abnormalities of gait and mobility  Localized edema  RATIONALE FOR EVALUATION AND TREATMENT: Rehabilitation  ONSET DATE: 06/13/23 - L TKA  NEXT MD VISIT: 07/26/23   SUBJECTIVE:  SUBJECTIVE STATEMENT: Pt reports increased pain since about 4:00 this morning - she states she did not take any narcotics last night as she is trying to avoid these - only taking Tylenol .  EVAL:  S/p L TKA on 06/13/23. HH PT completed 06/21/23.  Uses CPM for ~1 hr at time but limited tolerance due to RLS - CPM due to be returned on Monday.  Pt reports she has been moving around well at home and weaned herself to the [facility] as of yesterday.  Pain not too bad during the day but still interferes with sleep.  She notes a popping feeling which the MD told her might be from her patella or her ITB.  Still competing HH PT HEP 2x/day.  PAIN: Are you having pain? Yes: NPRS scale: 6/10) Pain location: L knee Pain description: tight Aggravating factors: end ROM knee flexion Relieving factors: Tylenol  during day, Tylenol  #3 and muscle relaxant at night, ice  PERTINENT  HISTORY:  OA - B knees s/p L TKA 06/13/23; B hearing loss; basal cell carcinoma of skin - multiple locations  PRECAUTIONS: None  RED FLAGS: None  WEIGHT BEARING RESTRICTIONS: No  FALLS:  Has patient fallen in last 6 months? No  LIVING ENVIRONMENT: Lives with: lives with their spouse Lives in: House/apartment Stairs: Yes: External: 2 steps; none Has following equipment at home: Single point cane, Walker - 2 wheeled, and bed side commode  OCCUPATION: Retired  PLOF: Independent and Leisure: walking daily 30-45 min, reading  PATIENT GOALS: Be able to walk normally again and not hurt my other knee.   OBJECTIVE: (objective measures completed at initial evaluation unless otherwise dated)  DIAGNOSTIC FINDINGS:  06/13/23 - DG Left knee: Left knee arthroplasty without immediate postoperative complication.  03/22/23 - XR Left knee: X-rays of the left knee show advanced tricompartmental degenerative joint disease worse in the patellofemoral compartment.   PATIENT SURVEYS:  LEFS 40 / 80 = 50.0 %  COGNITION: Overall cognitive status: Within functional limits for tasks assessed    SENSATION: WFL  EDEMA:  Mild/mod edema in L knee and lower leg  PALPATION: Mildly reduced L patellar mobility, predominantly superiorly/inferiorly  MUSCLE LENGTH:  (07/01/23) Hamstrings: mild tight L ITB: mild tight L Piriformis: mild tight L Hip flexors: WFL Quads: mod tight L Heelcord: mild tight L  LOWER EXTREMITY ROM:  Active ROM Right eval Left eval  Knee flexion 135 105  Knee extension 0 -13   Passive ROM Left eval  Knee flexion 109  Knee extension -4  (Blank rows = not tested)  LOWER EXTREMITY MMT:  MMT Right eval Left eval  Hip flexion 5 4-  Hip extension 4+ 4  Hip abduction 5 4  Hip adduction 5 4-  Hip internal rotation 5 4  Hip external rotation 4+ 3+  Knee flexion 5 4  Knee extension 5 4-  Ankle dorsiflexion 5 5  Ankle plantarflexion    Ankle inversion    Ankle  eversion     (Blank rows = not tested)  FUNCTIONAL TESTS:  5 times sit to stand: 16.88 sec Timed up and go (TUG): 13.85 sec with SPC Dynamic Gait Index: 19/24  GAIT: Distance walked: clinic distances Assistive device utilized: Single point cane Level of assistance: Modified independence Gait pattern: decreased stance time- Left, decreased stride length, decreased hip/knee flexion- Left, and lateral lean- Right   TODAY'S TREATMENT:   07/01/23 THERAPEUTIC EXERCISE: to improve flexibility, strength and mobility.  Demonstration, verbal and tactile cues throughout for technique.  Rec bike (seat #3) - L1 x 6 min Supine L HS stretch with strap 2 x 30 Supine L cross-body ITB stretch with strap 2 x 30 Prone quad stretch with strap 2 x 30, 2nd rep with rolled pillow above knee for RF stretch L knee prone hang x 3 min Fitter leg press (1 black/1 blue) 2 x 10 Seated blue TB leg press 2 x 10 Standing blue TB TKE 2 x 10 Supine HS curls with heels on peanut ball + strap assist for L knee flexion AAROM x 15  MANUAL THERAPY: To promote normalized muscle tension, improved flexibility, increased ROM, and reduced pain. IASTM with foam roller to L quads, ITB and tibialis anterior L knee patellar mobs - all directions   MODALITIES: Game Ready vasopneumatic compression post session to L knee x 10 min, medium compression, 34 to reduce post-exercise pain and swelling/edema   06/29/23 - Eval THERAPEUTIC EXERCISE: to improve flexibility, strength and mobility.  Demonstration, verbal and tactile cues throughout for technique.  Reviewed HH HEP, clarifying which exercises to continue and progressing the following exercises: Standing hip abduction - added looped RTB at ankles and instructed pt to perform for B LE Standing hip extension - added looped RTB at ankles and instructed pt to perform for B LE Supine quad set progressed to standing TKE against ball on wall Clarified alignment and depth with  mini-squats, encouraging patient to progress depth of squat as tolerated  SELF CARE:  Provided instruction in L knee patellar mobs to promote improved patellofemoral motion for increased L knee ROM   PATIENT EDUCATION:  Education details: HEP update - proximal LE stretches & strengthening progression   Person educated: Patient Education method: Explanation, Demonstration, Verbal cues, and Handouts Education comprehension: verbalized understanding, returned demonstration, verbal cues required, and needs further education  HOME EXERCISE PROGRAM: Access Code: [email or web address] URL: [email or web address] Date: 07/01/2023 Prepared by: Elijah Hidden  Exercises - Standing Hip Extension with Resistance at Ankles and Counter Support  - 1 x daily - 3 x weekly - 2 sets - 10 reps - 3 sec hold - Standing Hip Abduction with Resistance at Ankles and Counter Support  - 1 x daily - 3 x weekly - 2 sets - 10 reps - 3 sec hold - Standing Terminal Knee Extension at Wall with Ball (Mirrored)  - 1 x daily - 3 x weekly - 2 sets - 10 reps - 5 sec hold - Mini Squat with Counter Support  - 1 x daily - 7 x weekly - 2 sets - 10 reps - 3-5 sec hold - Supine Hamstring Stretch with Strap (Mirrored)  - 2-3 x daily - 7 x weekly - 3 reps - 30 sec hold - Supine Iliotibial Band Stretch with Strap (Mirrored)  - 2-3 x daily - 7 x weekly - 3 reps - 30 sec hold - Prone Quad Stretch with Towel Roll and Strap  - 2-3 x daily - 7 x weekly - 3 reps - 30 sec hold - Prone Knee Extension Hang  - 2 x daily - 7 x weekly - 3-5 min hold - Seated Leg Press with Resistance  - 1 x daily - 3 x weekly - 2 sets - 10 reps - 3 sec hold - Standing Terminal Knee Extension with Resistance  - 1 x daily - 3 x weekly - 2 sets - 10 reps - 5 sec hold   ASSESSMENT:  CLINICAL IMPRESSION: Kendra Le reports increased pain today as she has been trying  to go without the narcotic pain meds. Assessed proximal LE flexibility and introduced stretches to  address identified tightness as well as promote increased L knee ROM.  Progressed strengthening focusing on quad and HS as well as glute activation for increased L knee flexion/extension ROM and quad control/knee stability during gait.  Exercises well tolerated.  Session concluded with IASTM to L quads/ITB and anterior tibialis followed by GR vasopneumatic compression to reduce post-exercise soreness and edema.  Kendra Le will benefit from continued skilled PT to address ongoing ROM, strength and balance deficits to improve mobility and activity tolerance with decreased pain interference.  OBJECTIVE IMPAIRMENTS: Abnormal gait, decreased activity tolerance, decreased balance, decreased endurance, decreased knowledge of condition, decreased mobility, difficulty walking, decreased ROM, decreased strength, increased edema, increased fascial restrictions, impaired perceived functional ability, increased muscle spasms, impaired flexibility, improper body mechanics, postural dysfunction, and pain.   ACTIVITY LIMITATIONS: carrying, lifting, bending, sitting, standing, squatting, sleeping, stairs, transfers, bed mobility, bathing, and locomotion level  PARTICIPATION LIMITATIONS: meal prep, cleaning, laundry, driving, shopping, and community activity  PERSONAL FACTORS: Past/current experiences, Time since onset of injury/illness/exacerbation, and 1-2 comorbidities: OA - B knees; B hearing loss; basal cell carcinoma of skin - multiple locations  are also affecting patient's functional outcome.   REHAB POTENTIAL: Excellent  CLINICAL DECISION MAKING: Stable/uncomplicated  EVALUATION COMPLEXITY: Low   GOALS: Goals reviewed with patient? Yes  SHORT TERM GOALS: Target date: 07/20/2023  Patient will be independent with initial HEP. Baseline: Performing HH PT HEP 2x/day - reviewed and modified on eval Goal status: IN PROGRESS  2.  Patient will report at least 25% improvement in L knee pain to improve  QOL. Baseline: 5/10 currently, up to 8-9/10 at worst (typically at night) Goal status: IN PROGRESS  3.  Patient will demonstrate improved L knee AROM to >/= -5-115 to improve gait and stair mechanics. Baseline: L knee AROM -13-105; PROM -4-109 Goal status: IN PROGRESS  LONG TERM GOALS: Target date: 08/10/2023  Patient will be independent with advanced/ongoing HEP to improve outcomes and carryover.  Baseline:  Goal status: IN PROGRESS  2.  Patient will report at least 50-75% improvement in L knee pain to improve QOL. Baseline: 5/10 currently, up to 8-9/10 at worst (typically at night) Goal status: IN PROGRESS  3.  Patient will demonstrate improved L knee AROM to >/= -2-120 to allow for normal gait and stair mechanics. Baseline: L knee AROM -13-105; PROM -4-109 Goal status: IN PROGRESS  4.  Patient will demonstrate improved L LE strength to >/= 4+/5 for improved stability and ease of mobility. Baseline: Refer to above LE MMT table Goal status: IN PROGRESS  5.  Patient will be able to ambulate 600' w/o AD and normal gait pattern without increased pain to access community.  Baseline: Currently ambulating with SPC and mildly antalgic gait pattern Goal status: IN PROGRESS  6. Patient will be able to ascend/descend stairs with 1 HR and reciprocal step pattern safely to access home and community.  Baseline: Mostly step to pattern due to limited L knee ROM and quad control Goal status: IN PROGRESS  7.  Patient will report >/= 50/80 on LEFS to demonstrate improved functional ability. Baseline: 40 / 80 = 50.0 % Goal status: IN PROGRESS  8.  Patient will demonstrate at least 22/24 on DGI to decrease risk of falls. Baseline: 19/24 Goal status: IN PROGRESS    PLAN:  PT FREQUENCY: 2-3x/week  PT DURATION: 6 weeks  PLANNED INTERVENTIONS: 97164- PT Re-evaluation, 97110-Therapeutic exercises,  02469- Therapeutic activity, [id number]- Neuromuscular re-education, [id number]- Self Care, [id number]-  Manual therapy, [id number]- Gait training, [id number]- Electrical stimulation (unattended), [id number]- Electrical stimulation (manual), 97016- Vasopneumatic device, [id number]- Ultrasound, Patient/Family education, Balance training, Stair training, Taping, Dry Needling, Joint mobilization, Scar mobilization, DME instructions, Cryotherapy, and Moist heat  PLAN FOR NEXT SESSION: progress L knee ROM; progress quad and proximal LE strengthening; MT as indicated to address abnormal muscle tension and improve L knee ROM; modalities including vasopneumatic compression as indicated for pain and edema management   Elijah CHRISTELLA Hidden, PT 07/01/2023, 8:55 AM

## 2023-07-04 ENCOUNTER — Ambulatory Visit: Payer: Medicare HMO

## 2023-07-05 ENCOUNTER — Telehealth: Payer: Self-pay | Admitting: *Deleted

## 2023-07-05 NOTE — Telephone Encounter (Signed)
Is she constipated?

## 2023-07-05 NOTE — Telephone Encounter (Signed)
 Patient called today and states she is concerned because she is still running a low grade fever at times (99.4). Feels extremely tired and not eating much. No increased redness around incision and still attending OPPT. She has stopped her pain medication b/c she thought maybe that was the issue, but still feels full all the time and can't even finish an apple. Taking Xarelto  as prescribed as well. Any recommendations? Would coming in to be seen by advisable? Surgery was 06/13/23.

## 2023-07-05 NOTE — Telephone Encounter (Signed)
 Yeah sounds like her bowels need to move.

## 2023-07-05 NOTE — Telephone Encounter (Signed)
Sounds like a good plan to me

## 2023-07-06 ENCOUNTER — Encounter: Payer: Self-pay | Admitting: Physical Therapy

## 2023-07-06 ENCOUNTER — Ambulatory Visit: Payer: Medicare HMO | Admitting: Physical Therapy

## 2023-07-06 DIAGNOSIS — M25562 Pain in left knee: Secondary | ICD-10-CM | POA: Diagnosis not present

## 2023-07-06 DIAGNOSIS — M25662 Stiffness of left knee, not elsewhere classified: Secondary | ICD-10-CM | POA: Diagnosis not present

## 2023-07-06 DIAGNOSIS — M1712 Unilateral primary osteoarthritis, left knee: Secondary | ICD-10-CM | POA: Diagnosis not present

## 2023-07-06 DIAGNOSIS — R600 Localized edema: Secondary | ICD-10-CM | POA: Diagnosis not present

## 2023-07-06 DIAGNOSIS — R2689 Other abnormalities of gait and mobility: Secondary | ICD-10-CM | POA: Diagnosis not present

## 2023-07-06 DIAGNOSIS — M6281 Muscle weakness (generalized): Secondary | ICD-10-CM | POA: Diagnosis not present

## 2023-07-06 NOTE — Therapy (Signed)
 OUTPATIENT PHYSICAL THERAPY TREATMENT   Patient Name: Kendra Le MRN: [id number] DOB:20-Oct-1955, 68 y.o., female Today's Date: 07/06/2023   END OF SESSION:  PT End of Session - 07/06/23 1015     Visit Number 3    Date for PT Re-Evaluation 08/10/23    Authorization Type [employer]    PT Start Time 1015    PT Stop Time 1115    PT Time Calculation (min) 60 min    Activity Tolerance Patient tolerated treatment well    Behavior During Therapy [facility] for tasks assessed/performed              Past Medical History:  Diagnosis Date   Arthritis    Atypical nevus 05/23/2000   Left Upper Arm-Mild   Atypical nevus 05/25/2006   Right Calf-Moderate   Atypical nevus 01/23/2010   Left Upper Inner Arm-Mild   Atypical nevus 08/06/2015   Left Forearm-Mild   BCC (basal cell carcinoma of skin) 03/31/2004   Right Lower Back (tx p bx)   GERD (gastroesophageal reflux disease)    History of kidney stones 2010   Migraine    PONV (postoperative nausea and vomiting)    Superficial basal cell carcinoma (BCC) 10/15/1999   Lower Post Neck, Left Mid Back,Left Lower Back and Upper Sternum (all Cx3,5FU)   Superficial basal cell carcinoma (BCC) 03/01/2014   Left Post Shoulder (Cx3,5FU)   Superficial basal cell carcinoma (BCC) 06/26/2014   Right Shoulder (Cx3,5FU)   Superficial basal cell carcinoma (BCC) 06/28/2017   Sup Upper Sternum (tx p bx)   Past Surgical History:  Procedure Laterality Date   ABDOMINAL HYSTERECTOMY     ANTERIOR CRUCIATE LIGAMENT REPAIR     left   BREAST SURGERY     CHOLECYSTECTOMY     TOTAL KNEE ARTHROPLASTY Left 06/13/2023   Procedure: LEFT TOTAL KNEE ARTHROPLASTY;  Surgeon: Wes Hamman, MD;  Location: [facility] OR;  Service: Orthopedics;  Laterality: Left;   Patient Active Problem List   Diagnosis Date Noted   Status post total left knee replacement 06/13/2023   Primary osteoarthritis of right knee 09/01/2021   Primary osteoarthritis of left knee 09/01/2021    Ingrown toenail 12/21/2019   Left hip pain 09/04/2017   Right shoulder pain 09/04/2017   Arthralgia of right temporomandibular joint 11/16/2016   Sensorineural hearing loss (SNHL) of both ears 11/16/2016   Tinnitus of left ear 11/16/2016   Synovial cyst 08/07/2015   Trochanteric bursitis of both hips 10/22/2014   Plantar fasciitis, left 12/24/2013   Left knee pain 10/15/2011    PCP: Roselind Congo, MD   REFERRING PROVIDER: Wes Hamman, MD   REFERRING DIAG: [phone] (ICD-10-CM) - Primary osteoarthritis of left knee   THERAPY DIAG:  Acute pain of left knee  Stiffness of left knee, not elsewhere classified  Muscle weakness (generalized)  Other abnormalities of gait and mobility  Localized edema  RATIONALE FOR EVALUATION AND TREATMENT: Rehabilitation  ONSET DATE: 06/13/23 - L TKA  NEXT MD VISIT: 07/26/23   SUBJECTIVE:  SUBJECTIVE STATEMENT: Pt reports she had a bad night and was unable to get any relief.  EVAL:  S/p L TKA on 06/13/23. HH PT completed 06/21/23.  Uses CPM for ~1 hr at time but limited tolerance due to RLS - CPM due to be returned on Monday.  Pt reports she has been moving around well at home and weaned herself to the [facility] as of yesterday.  Pain not too bad during the day but still interferes with sleep.  She notes a popping feeling which the MD told her might be from her patella or her ITB.  Still competing HH PT HEP 2x/day.  PAIN: Are you having pain? Yes: NPRS scale: 6/10 Pain location: L knee & ITB Pain description: throbbing Aggravating factors: end ROM knee flexion Relieving factors: Tylenol  during day, Tylenol  #3 and muscle relaxant at night, ice  PERTINENT HISTORY:  OA - B knees s/p L TKA 06/13/23; B hearing loss; basal cell carcinoma of skin - multiple  locations  PRECAUTIONS: None  RED FLAGS: None  WEIGHT BEARING RESTRICTIONS: No  FALLS:  Has patient fallen in last 6 months? No  LIVING ENVIRONMENT: Lives with: lives with their spouse Lives in: House/apartment Stairs: Yes: External: 2 steps; none Has following equipment at home: Single point cane, Walker - 2 wheeled, and bed side commode  OCCUPATION: Retired  PLOF: Independent and Leisure: walking daily 30-45 min, reading  PATIENT GOALS: "Be able to walk normally again and not hurt my other knee."   OBJECTIVE: (objective measures completed at initial evaluation unless otherwise dated)  DIAGNOSTIC FINDINGS:  06/13/23 - DG Left knee: Left knee arthroplasty without immediate postoperative complication.  03/22/23 - XR Left knee: X-rays of the left knee show advanced tricompartmental degenerative joint disease worse in the patellofemoral compartment.   PATIENT SURVEYS:  LEFS 40 / 80 = 50.0 %  COGNITION: Overall cognitive status: Within functional limits for tasks assessed    SENSATION: WFL  EDEMA:  Mild/mod edema in L knee and lower leg  PALPATION: Mildly reduced L patellar mobility, predominantly superiorly/inferiorly  MUSCLE LENGTH:  (07/01/23) Hamstrings: mild tight L ITB: mild tight L Piriformis: mild tight L Hip flexors: WFL Quads: mod tight L Heelcord: mild tight L  LOWER EXTREMITY ROM:  Active ROM Right eval Left eval L 07/06/23  Knee flexion 135 105 114  Knee extension 0 -13 -8   Passive ROM Left eval L 07/06/23  Knee flexion 109   Knee extension -4 -2  (Blank rows = not tested)  LOWER EXTREMITY MMT:  MMT Right eval Left eval  Hip flexion 5 4-  Hip extension 4+ 4  Hip abduction 5 4  Hip adduction 5 4-  Hip internal rotation 5 4  Hip external rotation 4+ 3+  Knee flexion 5 4  Knee extension 5 4-  Ankle dorsiflexion 5 5  Ankle plantarflexion    Ankle inversion    Ankle eversion     (Blank rows = not tested)  FUNCTIONAL TESTS:  5 times  sit to stand: 16.88 sec Timed up and go (TUG): 13.85 sec with SPC Dynamic Gait Index: 19/24  GAIT: Distance walked: clinic distances Assistive device utilized: Single point cane Level of assistance: Modified independence Gait pattern: decreased stance time- Left, decreased stride length, decreased hip/knee flexion- Left, and lateral lean- Right   TODAY'S TREATMENT:   07/06/23 THERAPEUTIC EXERCISE: to improve flexibility, strength and mobility.  Demonstration, verbal and tactile cues throughout for technique.  Rec bike (seat #3) - L2  x 6 min L fwd step-up to 6" step x 10 L fwd step-up to 6" step + blue TB TKE x 10 L lateral step-up to 6" step x 10 S/L L RTB clam 2 x 10  MANUAL THERAPY: To promote normalized muscle tension, improved flexibility, improved joint mobility, increased ROM, and reduced pain.  Kinesiotape:  L ITB - "I" strip from ITB insertion to proximal to greater trochanter + perpendicular "I" strip crossing at greater trochanter  STM/DTM and manual TPR to L glutes  GAIT TRAINING: To normalize gait pattern and prepare to wean from AD. 270' with [facility] - cues for increased L hip and knee flexion during swing phase of gait and heel strike on weight acceptance to promote increased quad activation and knee extension   MODALITIES: Game Ready vasopneumatic compression post session to L knee x 10 min, medium compression, 34 to reduce post-exercise pain and swelling/edema   07/01/23 THERAPEUTIC EXERCISE: to improve flexibility, strength and mobility.  Demonstration, verbal and tactile cues throughout for technique.  Rec bike (seat #3) - L1 x 6 min Supine L HS stretch with strap 2 x 30" Supine L cross-body ITB stretch with strap 2 x 30" Prone quad stretch with strap 2 x 30", 2nd rep with rolled pillow above knee for RF stretch L knee prone hang x 3 min Fitter leg press (1 black/1 blue) 2 x 10 Seated blue TB leg press 2 x 10 Standing blue TB TKE 2 x 10 Supine HS curls with  heels on peanut ball + strap assist for L knee flexion AAROM x 15  MANUAL THERAPY: To promote normalized muscle tension, improved flexibility, increased ROM, and reduced pain. IASTM with foam roller to L quads, ITB and tibialis anterior L knee patellar mobs - all directions   MODALITIES: Game Ready vasopneumatic compression post session to L knee x 10 min, medium compression, 34 to reduce post-exercise pain and swelling/edema   06/29/23 - Eval THERAPEUTIC EXERCISE: to improve flexibility, strength and mobility.  Demonstration, verbal and tactile cues throughout for technique.  Reviewed HH HEP, clarifying which exercises to continue and progressing the following exercises: Standing hip abduction - added looped RTB at ankles and instructed pt to perform for B LE Standing hip extension - added looped RTB at ankles and instructed pt to perform for B LE Supine quad set progressed to standing TKE against ball on wall Clarified alignment and depth with mini-squats, encouraging patient to progress depth of squat as tolerated  SELF CARE:  Provided instruction in L knee patellar mobs to promote improved patellofemoral motion for increased L knee ROM   PATIENT EDUCATION:  Education details: HEP update - proximal LE stretches & strengthening progression   Person educated: Patient Education method: Explanation, Demonstration, Verbal cues, and Handouts Education comprehension: verbalized understanding, returned demonstration, verbal cues required, and needs further education  HOME EXERCISE PROGRAM: Access Code: [email or web address] URL: [email or web address] Date: 07/06/2023 Prepared by: Felecia Hopper  Exercises - Standing Hip Extension with Resistance at Ankles and Counter Support  - 1 x daily - 3 x weekly - 2 sets - 10 reps - 3 sec hold - Standing Hip Abduction with Resistance at Ankles and Counter Support  - 1 x daily - 3 x weekly - 2 sets - 10 reps - 3 sec hold - Standing Terminal Knee  Extension at Wall with Ball (Mirrored)  - 1 x daily - 3 x weekly - 2 sets - 10 reps - 5 sec hold - Mini Squat with  Counter Support  - 1 x daily - 7 x weekly - 2 sets - 10 reps - 3-5 sec hold - Supine Hamstring Stretch with Strap (Mirrored)  - 2-3 x daily - 7 x weekly - 3 reps - 30 sec hold - Supine Iliotibial Band Stretch with Strap (Mirrored)  - 2-3 x daily - 7 x weekly - 3 reps - 30 sec hold - Prone Quad Stretch with Towel Roll and Strap  - 2-3 x daily - 7 x weekly - 3 reps - 30 sec hold - Prone Knee Extension Hang  - 2 x daily - 7 x weekly - 3-5 min hold - Seated Leg Press with Resistance  - 1 x daily - 3 x weekly - 2 sets - 10 reps - 3 sec hold - Standing Terminal Knee Extension with Resistance  - 1 x daily - 3 x weekly - 2 sets - 10 reps - 5 sec hold - Standing ITB Stretch  - 2-3 x daily - 7 x weekly - 3 reps - 30 sec hold - Clam with Resistance (Mirrored)  - 1 x daily - 3 x weekly - 2 sets - 10 reps - 3-5 sec hold  Patient Education - Kinesiology tape   ASSESSMENT:  CLINICAL IMPRESSION: Kendra Le continues to report sleep disturbance due to L knee pain and ITB irritation.  Added additional ITB stretch in standing to target more of TFL feed-in to ITB and initiated trial of kinesiotaping for L ITB to hopefully reduce pain and irritation.  MT targeting increased muscle tension in glutes with some relief noted, but pt may benefit from TPDN if ITB pain remains an issue.  Progressed functional strengthening with introduction of fwd and lateral step-ups, adding blue TB TKE to promote increased quad activation.  L knee ROM progressing well with AROM improved to -8-114 and supported extension down to -2.  Kendra Le will benefit from continued skilled PT to address ongoing abnormal muscle tension, ROM, strength and balance deficits to improve mobility and activity tolerance with decreased pain interference.  OBJECTIVE IMPAIRMENTS: Abnormal gait, decreased activity tolerance, decreased balance, decreased  endurance, decreased knowledge of condition, decreased mobility, difficulty walking, decreased ROM, decreased strength, increased edema, increased fascial restrictions, impaired perceived functional ability, increased muscle spasms, impaired flexibility, improper body mechanics, postural dysfunction, and pain.   ACTIVITY LIMITATIONS: carrying, lifting, bending, sitting, standing, squatting, sleeping, stairs, transfers, bed mobility, bathing, and locomotion level  PARTICIPATION LIMITATIONS: meal prep, cleaning, laundry, driving, shopping, and community activity  PERSONAL FACTORS: Past/current experiences, Time since onset of injury/illness/exacerbation, and 1-2 comorbidities: OA - B knees; B hearing loss; basal cell carcinoma of skin - multiple locations  are also affecting patient's functional outcome.   REHAB POTENTIAL: Excellent  CLINICAL DECISION MAKING: Stable/uncomplicated  EVALUATION COMPLEXITY: Low   GOALS: Goals reviewed with patient? Yes  SHORT TERM GOALS: Target date: 07/20/2023  Patient will be independent with initial HEP. Baseline: Performing HH PT HEP 2x/day - reviewed and modified on eval Goal status: IN PROGRESS - 07/06/23  2.  Patient will report at least 25% improvement in L knee pain to improve QOL. Baseline: 5/10 currently, up to 8-9/10 at worst (typically at night) Goal status: IN PROGRESS  3.  Patient will demonstrate improved L knee AROM to >/= -5-115 to improve gait and stair mechanics. Baseline: L knee AROM -13-105; PROM -4-109 Goal status: IN PROGRESS - 07/06/23 - L knee AROM -8-114 with supported extension down to -2  LONG TERM GOALS: Target date: 08/10/2023  Patient will be independent with advanced/ongoing HEP to improve outcomes and carryover.  Baseline:  Goal status: IN PROGRESS  2.  Patient will report at least 50-75% improvement in L knee pain to improve QOL. Baseline: 5/10 currently, up to 8-9/10 at worst (typically at night) Goal status: IN  PROGRESS  3.  Patient will demonstrate improved L knee AROM to >/= -2-120 to allow for normal gait and stair mechanics. Baseline: L knee AROM -13-105; PROM -4-109 Goal status: IN PROGRESS  4.  Patient will demonstrate improved L LE strength to >/= 4+/5 for improved stability and ease of mobility. Baseline: Refer to above LE MMT table Goal status: IN PROGRESS  5.  Patient will be able to ambulate 600' w/o AD and normal gait pattern without increased pain to access community.  Baseline: Currently ambulating with SPC and mildly antalgic gait pattern Goal status: IN PROGRESS  6. Patient will be able to ascend/descend stairs with 1 HR and reciprocal step pattern safely to access home and community.  Baseline: Mostly step to pattern due to limited L knee ROM and quad control Goal status: IN PROGRESS  7.  Patient will report >/= 50/80 on LEFS to demonstrate improved functional ability. Baseline: 40 / 80 = 50.0 % Goal status: IN PROGRESS  8.  Patient will demonstrate at least 22/24 on DGI to decrease risk of falls. Baseline: 19/24 Goal status: IN PROGRESS    PLAN:  PT FREQUENCY: 2-3x/week  PT DURATION: 6 weeks  PLANNED INTERVENTIONS: 97164- PT Re-evaluation, 97110-Therapeutic exercises, 97530- Therapeutic activity, 97112- Neuromuscular re-education, 97535- Self Care, 16109- Manual therapy, [phone]- Gait training, 97014- Electrical stimulation (unattended), [phone]- Electrical stimulation (manual), 97016- Vasopneumatic device, 97035- Ultrasound, Patient/Family education, Balance training, Stair training, Taping, Dry Needling, Joint mobilization, Scar mobilization, DME instructions, Cryotherapy, and Moist heat  PLAN FOR NEXT SESSION: Assess response to [place]; progress L knee ROM; progress quad and proximal LE strengthening; MT as indicated to address abnormal muscle tension and improve L knee ROM; modalities including vasopneumatic compression as indicated for pain and edema  management   Francisco Irving, PT 07/06/2023, 11:41 AM

## 2023-07-08 ENCOUNTER — Ambulatory Visit: Payer: Medicare HMO | Admitting: Physical Therapy

## 2023-07-08 ENCOUNTER — Encounter: Payer: Self-pay | Admitting: Physical Therapy

## 2023-07-08 DIAGNOSIS — M25562 Pain in left knee: Secondary | ICD-10-CM | POA: Diagnosis not present

## 2023-07-08 DIAGNOSIS — R2689 Other abnormalities of gait and mobility: Secondary | ICD-10-CM

## 2023-07-08 DIAGNOSIS — M25662 Stiffness of left knee, not elsewhere classified: Secondary | ICD-10-CM

## 2023-07-08 DIAGNOSIS — M6281 Muscle weakness (generalized): Secondary | ICD-10-CM

## 2023-07-08 DIAGNOSIS — R600 Localized edema: Secondary | ICD-10-CM

## 2023-07-08 DIAGNOSIS — M1712 Unilateral primary osteoarthritis, left knee: Secondary | ICD-10-CM | POA: Diagnosis not present

## 2023-07-08 NOTE — Therapy (Signed)
OUTPATIENT PHYSICAL THERAPY TREATMENT   Patient Name: Kendra Le MRN: [id number] DOB:1956/04/02, 68 y.o., female Today's Date: 07/08/2023   END OF SESSION:  PT End of Session - 07/08/23 1018     Visit Number 4    Date for PT Re-Evaluation 08/10/23    Authorization Type [employer]    PT Start Time 1018    PT Stop Time 1112    PT Time Calculation (min) 54 min    Activity Tolerance Patient tolerated treatment well    Behavior During Therapy [facility] for tasks assessed/performed               Past Medical History:  Diagnosis Date   Arthritis    Atypical nevus 05/23/2000   Left Upper Arm-Mild   Atypical nevus 05/25/2006   Right Calf-Moderate   Atypical nevus 01/23/2010   Left Upper Inner Arm-Mild   Atypical nevus 08/06/2015   Left Forearm-Mild   BCC (basal cell carcinoma of skin) 03/31/2004   Right Lower Back (tx p bx)   GERD (gastroesophageal reflux disease)    History of kidney stones 2010   Migraine    PONV (postoperative nausea and vomiting)    Superficial basal cell carcinoma (BCC) 10/15/1999   Lower Post Neck, Left Mid Back,Left Lower Back and Upper Sternum (all Cx3,5FU)   Superficial basal cell carcinoma (BCC) 03/01/2014   Left Post Shoulder (Cx3,5FU)   Superficial basal cell carcinoma (BCC) 06/26/2014   Right Shoulder (Cx3,5FU)   Superficial basal cell carcinoma (BCC) 06/28/2017   Sup Upper Sternum (tx p bx)   Past Surgical History:  Procedure Laterality Date   ABDOMINAL HYSTERECTOMY     ANTERIOR CRUCIATE LIGAMENT REPAIR     left   BREAST SURGERY     CHOLECYSTECTOMY     TOTAL KNEE ARTHROPLASTY Left 06/13/2023   Procedure: LEFT TOTAL KNEE ARTHROPLASTY;  Surgeon: Tarry Kos, MD;  Location: [facility] OR;  Service: Orthopedics;  Laterality: Left;   Patient Active Problem List   Diagnosis Date Noted   Status post total left knee replacement 06/13/2023   Primary osteoarthritis of right knee 09/01/2021   Primary osteoarthritis of left knee 09/01/2021    Ingrown toenail 12/21/2019   Left hip pain 09/04/2017   Right shoulder pain 09/04/2017   Arthralgia of right temporomandibular joint 11/16/2016   Sensorineural hearing loss (SNHL) of both ears 11/16/2016   Tinnitus of left ear 11/16/2016   Synovial cyst 08/07/2015   Trochanteric bursitis of both hips 10/22/2014   Plantar fasciitis, left 12/24/2013   Left knee pain 10/15/2011    PCP: Noberto Retort, MD   REFERRING PROVIDER: Tarry Kos, MD   REFERRING DIAG: [phone] (ICD-10-CM) - Primary osteoarthritis of left knee   THERAPY DIAG:  Acute pain of left knee  Stiffness of left knee, not elsewhere classified  Muscle weakness (generalized)  Other abnormalities of gait and mobility  Localized edema  RATIONALE FOR EVALUATION AND TREATMENT: Rehabilitation  ONSET DATE: 06/13/23 - L TKA  NEXT MD VISIT: 07/26/23   SUBJECTIVE:  SUBJECTIVE STATEMENT: Pt feels like the [place] may be helping - still in place.  EVAL:  S/p L TKA on 06/13/23. HH PT completed 06/21/23.  Uses CPM for ~1 hr at time but limited tolerance due to RLS - CPM due to be returned on Monday.  Pt reports she has been moving around well at home and weaned herself to the [facility] as of yesterday.  Pain not too bad during the day but still interferes with sleep.  She notes a popping feeling which the MD told her might be from her patella or her ITB.  Still competing HH PT HEP 2x/day.  PAIN: Are you having pain? Yes: NPRS scale: 5/10 Pain location: L knee & ITB Pain description: throbbing Aggravating factors: end ROM knee flexion Relieving factors: Tylenol during day, Tylenol #3 and muscle relaxant at night, ice  PERTINENT HISTORY:  OA - B knees s/p L TKA 06/13/23; B hearing loss; basal cell carcinoma of skin - multiple  locations  PRECAUTIONS: None  RED FLAGS: None  WEIGHT BEARING RESTRICTIONS: No  FALLS:  Has patient fallen in last 6 months? No  LIVING ENVIRONMENT: Lives with: lives with their spouse Lives in: House/apartment Stairs: Yes: External: 2 steps; none Has following equipment at home: Single point cane, Walker - 2 wheeled, and bed side commode  OCCUPATION: Retired  PLOF: Independent and Leisure: walking daily 30-45 min, reading  PATIENT GOALS: "Be able to walk normally again and not hurt my other knee."   OBJECTIVE: (objective measures completed at initial evaluation unless otherwise dated)  DIAGNOSTIC FINDINGS:  06/13/23 - DG Left knee: Left knee arthroplasty without immediate postoperative complication.  03/22/23 - XR Left knee: X-rays of the left knee show advanced tricompartmental degenerative joint disease worse in the patellofemoral compartment.   PATIENT SURVEYS:  LEFS 40 / 80 = 50.0 %  COGNITION: Overall cognitive status: Within functional limits for tasks assessed    SENSATION: WFL  EDEMA:  Mild/mod edema in L knee and lower leg  PALPATION: Mildly reduced L patellar mobility, predominantly superiorly/inferiorly  MUSCLE LENGTH:  (07/01/23) Hamstrings: mild tight L ITB: mild tight L Piriformis: mild tight L Hip flexors: WFL Quads: mod tight L Heelcord: mild tight L  LOWER EXTREMITY ROM:  Active ROM Right eval Left eval L 07/06/23  Knee flexion 135 105 114  Knee extension 0 -13 -8   Passive ROM Left eval L 07/06/23  Knee flexion 109   Knee extension -4 -2  (Blank rows = not tested)  LOWER EXTREMITY MMT:  MMT Right eval Left eval  Hip flexion 5 4-  Hip extension 4+ 4  Hip abduction 5 4  Hip adduction 5 4-  Hip internal rotation 5 4  Hip external rotation 4+ 3+  Knee flexion 5 4  Knee extension 5 4-  Ankle dorsiflexion 5 5  Ankle plantarflexion    Ankle inversion    Ankle eversion     (Blank rows = not tested)  FUNCTIONAL TESTS:  5 times  sit to stand: 16.88 sec Timed up and go (TUG): 13.85 sec with SPC Dynamic Gait Index: 19/24  GAIT: Distance walked: clinic distances Assistive device utilized: Single point cane Level of assistance: Modified independence Gait pattern: decreased stance time- Left, decreased stride length, decreased hip/knee flexion- Left, and lateral lean- Right   TODAY'S TREATMENT:   07/08/23 THERAPEUTIC EXERCISE: to improve flexibility, strength and mobility.  Demonstration, verbal and tactile cues throughout for technique.  NuStep - L5 x 6 min (UE/LE) for  warm-up to promote tissue perfusion and reduce knee joint stiffness SLS + opp LE GTB 4-way SLR x 10, UE support on back of chair for balance - cues for quad activation maintaining L knee straight as well as avoidance of accessory trunk/pelvic motion Seated GTB L HS curls 2 x 10 Seated GTB L LAQ 2 x 10 TRX squats 2 x 10 TRX lateral lunge x 10 bil  MANUAL THERAPY: To promote normalized muscle tension, improved flexibility, improved joint mobility, increased ROM, and reduced pain. STM/STM and IASTM with edge tool to medial (VM) and mid distal quads (VI/RF) STM/DTM and manual TPR to L mid and proximal lateral quads (VL), medial hamstrings and hip adductors L patellar mobs all directions Instruction provided in incisional scar massage/mobilization avoiding areas where steri-strips or scabbing still present  MODALITIES: Game Ready vasopneumatic compression post session to L knee x 10 min, high compression, 34 to reduce post-exercise pain and swelling/edema   07/06/23 THERAPEUTIC EXERCISE: to improve flexibility, strength and mobility.  Demonstration, verbal and tactile cues throughout for technique.  Rec bike (seat #3) - L2 x 6 min L fwd step-up to 6" step x 10 L fwd step-up to 6" step + blue TB TKE x 10 L lateral step-up to 6" step x 10 S/L L RTB clam 2 x 10  MANUAL THERAPY: To promote normalized muscle tension, improved flexibility, improved  joint mobility, increased ROM, and reduced pain.  Kinesiotape:  L ITB - "I" strip from ITB insertion to proximal to greater trochanter + perpendicular "I" strip crossing at greater trochanter  STM/DTM and manual TPR to L glutes  GAIT TRAINING: To normalize gait pattern and prepare to wean from AD. 270' with [facility] - cues for increased L hip and knee flexion during swing phase of gait and heel strike on weight acceptance to promote increased quad activation and knee extension   MODALITIES: Game Ready vasopneumatic compression post session to L knee x 10 min, medium compression, 34 to reduce post-exercise pain and swelling/edema   07/01/23 THERAPEUTIC EXERCISE: to improve flexibility, strength and mobility.  Demonstration, verbal and tactile cues throughout for technique.  Rec bike (seat #3) - L1 x 6 min Supine L HS stretch with strap 2 x 30" Supine L cross-body ITB stretch with strap 2 x 30" Prone quad stretch with strap 2 x 30", 2nd rep with rolled pillow above knee for RF stretch L knee prone hang x 3 min Fitter leg press (1 black/1 blue) 2 x 10 Seated blue TB leg press 2 x 10 Standing blue TB TKE 2 x 10 Supine HS curls with heels on peanut ball + strap assist for L knee flexion AAROM x 15  MANUAL THERAPY: To promote normalized muscle tension, improved flexibility, increased ROM, and reduced pain. IASTM with foam roller to L quads, ITB and tibialis anterior L knee patellar mobs - all directions   MODALITIES: Game Ready vasopneumatic compression post session to L knee x 10 min, medium compression, 34 to reduce post-exercise pain and swelling/edema   PATIENT EDUCATION:  Education details: HEP progression and scar tissue mobilization  Person educated: Patient Education method: Programmer, multimedia, Demonstration, Verbal cues, and Handouts Education comprehension: verbalized understanding, returned demonstration, verbal cues required, and needs further education  HOME EXERCISE PROGRAM: Access  Code: [email or web address] URL: [email or web address] Date: 07/08/2023 Prepared by: Glenetta Hew  Exercises - Mini Squat with Counter Support  - 1 x daily - 7 x weekly - 2 sets - 10 reps - 3-5 sec hold -  Supine Hamstring Stretch with Strap (Mirrored)  - 2-3 x daily - 7 x weekly - 3 reps - 30 sec hold - Supine Iliotibial Band Stretch with Strap (Mirrored)  - 2-3 x daily - 7 x weekly - 3 reps - 30 sec hold - Prone Quad Stretch with Towel Roll and Strap  - 2-3 x daily - 7 x weekly - 3 reps - 30 sec hold - Prone Knee Extension Hang  - 2 x daily - 7 x weekly - 3-5 min hold - Seated Leg Press with Resistance  - 1 x daily - 3 x weekly - 2 sets - 10 reps - 3 sec hold - Standing Terminal Knee Extension with Resistance  - 1 x daily - 3 x weekly - 2 sets - 10 reps - 5 sec hold - Standing ITB Stretch  - 2-3 x daily - 7 x weekly - 3 reps - 30 sec hold - Clam with Resistance (Mirrored)  - 1 x daily - 3 x weekly - 2 sets - 10 reps - 3-5 sec hold - Standing Hip Flexion with Anchored Resistance and Chair Support  - 1 x daily - 3 x weekly - 2 sets - 10 reps - 3 sec hold - Standing Hip Adduction with Anchored Resistance  - 1 x daily - 3 x weekly - 2 sets - 10 reps - 3 sec hold - Standing Hip Extension with Anchored Resistance  - 1 x daily - 3 x weekly - 2 sets - 10 reps - 3 sec hold - Standing Hip Abduction with Anchored Resistance  - 1 x daily - 3 x weekly - 2 sets - 10 reps - 3 sec hold - Seated Hamstring Curl with Anchored Resistance  - 1 x daily - 3 x weekly - 2 sets - 10 reps - 3 sec hold - Sitting Knee Extension with Resistance  - 1 x daily - 3 x weekly - 2 sets - 10 reps - 3-5 sec hold  Patient Education - Kinesiology tape - Scar Massage   ASSESSMENT:  CLINICAL IMPRESSION: Shamyiah reports some relief of IT band irritation from ITB taping applied last visit, with tape still intact today.  She continues to report medial knee distal quad pain which continues to interfere with her sleep leaving her  feeling foggy throughout the day.  MT focusing on STM/DTM with IASTM and manual TPR to address areas of abnormal muscle tension in quads hip adductors and medial hamstrings.  Hip strengthening progressed to include emphasis on SLS stability and proprioceptive training, transitioning from looped RTB resistance hip extension and abduction to standing 4-way SLR with GTB.  GTB also added to quad and hamstring strengthening with good tolerance.  Squats progressed to TRX and introducing lateral lunge to isolate left and right LE.  Session concluded with GR vasopneumatic compression to reduce post exercise soreness and edema.  Hedy will benefit from continued skilled PT to address ongoing abnormal muscle tension, ROM, strength and balance deficits to improve mobility and activity tolerance with decreased pain interference.  OBJECTIVE IMPAIRMENTS: Abnormal gait, decreased activity tolerance, decreased balance, decreased endurance, decreased knowledge of condition, decreased mobility, difficulty walking, decreased ROM, decreased strength, increased edema, increased fascial restrictions, impaired perceived functional ability, increased muscle spasms, impaired flexibility, improper body mechanics, postural dysfunction, and pain.   ACTIVITY LIMITATIONS: carrying, lifting, bending, sitting, standing, squatting, sleeping, stairs, transfers, bed mobility, bathing, and locomotion level  PARTICIPATION LIMITATIONS: meal prep, cleaning, laundry, driving, shopping, and community activity  PERSONAL FACTORS: Past/current experiences, Time since onset of injury/illness/exacerbation, and 1-2 comorbidities: OA - B knees; B hearing loss; basal cell carcinoma of skin - multiple locations  are also affecting patient's functional outcome.   REHAB POTENTIAL: Excellent  CLINICAL DECISION MAKING: Stable/uncomplicated  EVALUATION COMPLEXITY: Low   GOALS: Goals reviewed with patient? Yes  SHORT TERM GOALS: Target date:  07/20/2023  Patient will be independent with initial HEP. Baseline: Performing HH PT HEP 2x/day - reviewed and modified on eval Goal status: MET - 07/06/23  2.  Patient will report at least 25% improvement in L knee pain to improve QOL. Baseline: 5/10 currently, up to 8-9/10 at worst (typically at night) Goal status: IN PROGRESS  3.  Patient will demonstrate improved L knee AROM to >/= -5-115 to improve gait and stair mechanics. Baseline: L knee AROM -13-105; PROM -4-109 Goal status: IN PROGRESS - 07/06/23 - L knee AROM -8-114 with supported extension down to -2  LONG TERM GOALS: Target date: 08/10/2023  Patient will be independent with advanced/ongoing HEP to improve outcomes and carryover.  Baseline:  Goal status: IN PROGRESS  2.  Patient will report at least 50-75% improvement in L knee pain to improve QOL. Baseline: 5/10 currently, up to 8-9/10 at worst (typically at night) Goal status: IN PROGRESS  3.  Patient will demonstrate improved L knee AROM to >/= -2-120 to allow for normal gait and stair mechanics. Baseline: L knee AROM -13-105; PROM -4-109 Goal status: IN PROGRESS  4.  Patient will demonstrate improved L LE strength to >/= 4+/5 for improved stability and ease of mobility. Baseline: Refer to above LE MMT table Goal status: IN PROGRESS  5.  Patient will be able to ambulate 600' w/o AD and normal gait pattern without increased pain to access community.  Baseline: Currently ambulating with SPC and mildly antalgic gait pattern Goal status: IN PROGRESS  6. Patient will be able to ascend/descend stairs with 1 HR and reciprocal step pattern safely to access home and community.  Baseline: Mostly step to pattern due to limited L knee ROM and quad control Goal status: IN PROGRESS  7.  Patient will report >/= 50/80 on LEFS to demonstrate improved functional ability. Baseline: 40 / 80 = 50.0 % Goal status: IN PROGRESS  8.  Patient will demonstrate at least 22/24 on  DGI to decrease risk of falls. Baseline: 19/24 Goal status: IN PROGRESS    PLAN:  PT FREQUENCY: 2-3x/week  PT DURATION: 6 weeks  PLANNED INTERVENTIONS: 97164- PT Re-evaluation, 97110-Therapeutic exercises, 97530- Therapeutic activity, 97112- Neuromuscular re-education, 97535- Self Care, 81191- Manual therapy, [phone]- Gait training, 97014- Electrical stimulation (unattended), [phone]- Electrical stimulation (manual), 97016- Vasopneumatic device, 97035- Ultrasound, Patient/Family education, Balance training, Stair training, Taping, Dry Needling, Joint mobilization, Scar mobilization, DME instructions, Cryotherapy, and Moist heat  PLAN FOR NEXT SESSION: progress L knee ROM; progress quad and proximal LE strengthening; MT as indicated to address abnormal muscle tension and improve L knee ROM, with reapplication of Kinesiotape as indicated and benefit noted; modalities including vasopneumatic compression as indicated for pain and edema management; weekly ROM reassessment   Marry Guan, PT 07/08/2023, 12:37 PM

## 2023-07-12 ENCOUNTER — Encounter: Payer: Self-pay | Admitting: Physical Therapy

## 2023-07-12 ENCOUNTER — Ambulatory Visit: Payer: Medicare HMO | Admitting: Physical Therapy

## 2023-07-12 DIAGNOSIS — M25562 Pain in left knee: Secondary | ICD-10-CM | POA: Diagnosis not present

## 2023-07-12 DIAGNOSIS — M1712 Unilateral primary osteoarthritis, left knee: Secondary | ICD-10-CM | POA: Diagnosis not present

## 2023-07-12 DIAGNOSIS — M25662 Stiffness of left knee, not elsewhere classified: Secondary | ICD-10-CM | POA: Diagnosis not present

## 2023-07-12 DIAGNOSIS — R2689 Other abnormalities of gait and mobility: Secondary | ICD-10-CM | POA: Diagnosis not present

## 2023-07-12 DIAGNOSIS — R600 Localized edema: Secondary | ICD-10-CM

## 2023-07-12 DIAGNOSIS — M6281 Muscle weakness (generalized): Secondary | ICD-10-CM

## 2023-07-12 NOTE — Therapy (Signed)
OUTPATIENT PHYSICAL THERAPY TREATMENT   Patient Name: Kendra Le MRN: [id number] DOB:1955/07/23, 68 y.o., female Today's Date: 07/12/2023   END OF SESSION:  PT End of Session - 07/12/23 1017     Visit Number 5    Date for PT Re-Evaluation 08/10/23    Authorization Type [employer]    PT Start Time 1017    PT Stop Time 1107    PT Time Calculation (min) 50 min    Activity Tolerance Patient tolerated treatment well    Behavior During Therapy [facility] for tasks assessed/performed                Past Medical History:  Diagnosis Date   Arthritis    Atypical nevus 05/23/2000   Left Upper Arm-Mild   Atypical nevus 05/25/2006   Right Calf-Moderate   Atypical nevus 01/23/2010   Left Upper Inner Arm-Mild   Atypical nevus 08/06/2015   Left Forearm-Mild   BCC (basal cell carcinoma of skin) 03/31/2004   Right Lower Back (tx p bx)   GERD (gastroesophageal reflux disease)    History of kidney stones 2010   Migraine    PONV (postoperative nausea and vomiting)    Superficial basal cell carcinoma (BCC) 10/15/1999   Lower Post Neck, Left Mid Back,Left Lower Back and Upper Sternum (all Cx3,5FU)   Superficial basal cell carcinoma (BCC) 03/01/2014   Left Post Shoulder (Cx3,5FU)   Superficial basal cell carcinoma (BCC) 06/26/2014   Right Shoulder (Cx3,5FU)   Superficial basal cell carcinoma (BCC) 06/28/2017   Sup Upper Sternum (tx p bx)   Past Surgical History:  Procedure Laterality Date   ABDOMINAL HYSTERECTOMY     ANTERIOR CRUCIATE LIGAMENT REPAIR     left   BREAST SURGERY     CHOLECYSTECTOMY     TOTAL KNEE ARTHROPLASTY Left 06/13/2023   Procedure: LEFT TOTAL KNEE ARTHROPLASTY;  Surgeon: Tarry Kos, MD;  Location: [facility] OR;  Service: Orthopedics;  Laterality: Left;   Patient Active Problem List   Diagnosis Date Noted   Status post total left knee replacement 06/13/2023   Primary osteoarthritis of right knee 09/01/2021   Primary osteoarthritis of left knee 09/01/2021    Ingrown toenail 12/21/2019   Left hip pain 09/04/2017   Right shoulder pain 09/04/2017   Arthralgia of right temporomandibular joint 11/16/2016   Sensorineural hearing loss (SNHL) of both ears 11/16/2016   Tinnitus of left ear 11/16/2016   Synovial cyst 08/07/2015   Trochanteric bursitis of both hips 10/22/2014   Plantar fasciitis, left 12/24/2013   Left knee pain 10/15/2011    PCP: Noberto Retort, MD   REFERRING PROVIDER: Tarry Kos, MD   REFERRING DIAG: [phone] (ICD-10-CM) - Primary osteoarthritis of left knee   THERAPY DIAG:  Acute pain of left knee  Stiffness of left knee, not elsewhere classified  Muscle weakness (generalized)  Other abnormalities of gait and mobility  Localized edema  RATIONALE FOR EVALUATION AND TREATMENT: Rehabilitation  ONSET DATE: 06/13/23 - L TKA  NEXT MD VISIT: 07/26/23   SUBJECTIVE:  SUBJECTIVE STATEMENT: Pt reports she has tried walking more w/o the [facility] but it aggravated her ITB so she went back to the cane.  EVAL:  S/p L TKA on 06/13/23. HH PT completed 06/21/23.  Uses CPM for ~1 hr at time but limited tolerance due to RLS - CPM due to be returned on Monday.  Pt reports she has been moving around well at home and weaned herself to the [facility] as of yesterday.  Pain not too bad during the day but still interferes with sleep.  She notes a popping feeling which the MD told her might be from her patella or her ITB.  Still competing HH PT HEP 2x/day.  PAIN: Are you having pain? Yes: NPRS scale: 5/10 Pain location: L knee & ITB Pain description: throbbing Aggravating factors: end ROM knee flexion Relieving factors: Tylenol during day, Tylenol #3 and muscle relaxant at night, ice  PERTINENT HISTORY:  OA - B knees s/p L TKA 06/13/23; B hearing  loss; basal cell carcinoma of skin - multiple locations  PRECAUTIONS: None  RED FLAGS: None  WEIGHT BEARING RESTRICTIONS: No  FALLS:  Has patient fallen in last 6 months? No  LIVING ENVIRONMENT: Lives with: lives with their spouse Lives in: House/apartment Stairs: Yes: External: 2 steps; none Has following equipment at home: Single point cane, Walker - 2 wheeled, and bed side commode  OCCUPATION: Retired  PLOF: Independent and Leisure: walking daily 30-45 min, reading  PATIENT GOALS: "Be able to walk normally again and not hurt my other knee."   OBJECTIVE: (objective measures completed at initial evaluation unless otherwise dated)  DIAGNOSTIC FINDINGS:  06/13/23 - DG Left knee: Left knee arthroplasty without immediate postoperative complication.  03/22/23 - XR Left knee: X-rays of the left knee show advanced tricompartmental degenerative joint disease worse in the patellofemoral compartment.   PATIENT SURVEYS:  LEFS 40 / 80 = 50.0 %  COGNITION: Overall cognitive status: Within functional limits for tasks assessed    SENSATION: WFL  EDEMA:  Mild/mod edema in L knee and lower leg  PALPATION: Mildly reduced L patellar mobility, predominantly superiorly/inferiorly  MUSCLE LENGTH:  (07/01/23) Hamstrings: mild tight L ITB: mild tight L Piriformis: mild tight L Hip flexors: WFL Quads: mod tight L Heelcord: mild tight L  LOWER EXTREMITY ROM:  Active ROM Right eval Left eval L 07/06/23  Knee flexion 135 105 114  Knee extension 0 -13 -8   Passive ROM Left eval L 07/06/23  Knee flexion 109   Knee extension -4 -2  (Blank rows = not tested)  LOWER EXTREMITY MMT:  MMT Right eval Left eval  Hip flexion 5 4-  Hip extension 4+ 4  Hip abduction 5 4  Hip adduction 5 4-  Hip internal rotation 5 4  Hip external rotation 4+ 3+  Knee flexion 5 4  Knee extension 5 4-  Ankle dorsiflexion 5 5  Ankle plantarflexion    Ankle inversion    Ankle eversion     (Blank rows  = not tested)  FUNCTIONAL TESTS:  5 times sit to stand: 16.88 sec Timed up and go (TUG): 13.85 sec with SPC Dynamic Gait Index: 19/24  GAIT: Distance walked: clinic distances Assistive device utilized: Single point cane Level of assistance: Modified independence Gait pattern: decreased stance time- Left, decreased stride length, decreased hip/knee flexion- Left, and lateral lean- Right   TODAY'S TREATMENT:   07/12/23 GAIT TRAINING: To normalize gait pattern and prepare to wean from AD. 400 ft w/o AD -  initially demonstrating increased R lateral lean with decreased weight shift to L LE but normalizing with increased distance Stairs: Level of Assistance: Modified independence Stair Negotiation Technique: Alternating Pattern  Forwards with Single Rail on Left Number of Stairs: 14  Height of Stairs: 7"  Comments: decreased L eccentric quad control   THERAPEUTIC EXERCISE: to improve flexibility, strength and mobility.  Demonstration, verbal and tactile cues throughout for technique.  L lateral eccentric lowering from 6" with light toe touch to floor x 10, on 2nd set pt experienced a cramp in her distal quads, 2nd set completed after MT and stretching w/o incident Prone L quad stretch with strap 3 x 30-60" - last rep with towel roll under knee for RF stretch Mod thomas L quad/hip flexor stretch with strap 2 x 60" L forward eccentric lowering from 4" with light toe touch to floor x 10 BATCA leg press 20# 2 x 10 BATCA knee flexion B con/ecc 25#x 10, B con/L ecc 20# 2 x 10 BATCA knee extension B con/ecc 15# 2 x 10, attempted B con/L ecc 10# but deferred d/t increased distal quad pain  MANUAL THERAPY: To promote normalized muscle tension, improved flexibility, and reduced pain. STM/DTM and manual TPR to L distal mid and lateral quads  Kinesiotape:  L ITB - "I" strip from ITB insertion to proximal to greater trochanter + perpendicular "I" strips crossing at greater trochanter and distal  quads  MODALITIES: Game Ready vasopneumatic compression post session to L knee x 10 min, high compression, 34 to reduce post-exercise pain and swelling/edema   07/08/23 THERAPEUTIC EXERCISE: to improve flexibility, strength and mobility.  Demonstration, verbal and tactile cues throughout for technique.  NuStep - L5 x 6 min (UE/LE) for warm-up to promote tissue perfusion and reduce knee joint stiffness SLS + opp LE GTB 4-way SLR x 10, UE support on back of chair for balance - cues for quad activation maintaining L knee straight as well as avoidance of accessory trunk/pelvic motion Seated GTB L HS curls 2 x 10 Seated GTB L LAQ 2 x 10 TRX squats 2 x 10 TRX lateral lunge x 10 bil  MANUAL THERAPY: To promote normalized muscle tension, improved flexibility, improved joint mobility, increased ROM, and reduced pain. STM/STM and IASTM with edge tool to medial (VM) and mid distal quads (VI/RF) STM/DTM and manual TPR to L mid and proximal lateral quads (VL), medial hamstrings and hip adductors L patellar mobs all directions Instruction provided in incisional scar massage/mobilization avoiding areas where steri-strips or scabbing still present  MODALITIES: Game Ready vasopneumatic compression post session to L knee x 10 min, high compression, 34 to reduce post-exercise pain and swelling/edema   07/06/23 THERAPEUTIC EXERCISE: to improve flexibility, strength and mobility.  Demonstration, verbal and tactile cues throughout for technique.  Rec bike (seat #3) - L2 x 6 min L fwd step-up to 6" step x 10 L fwd step-up to 6" step + blue TB TKE x 10 L lateral step-up to 6" step x 10 S/L L RTB clam 2 x 10  MANUAL THERAPY: To promote normalized muscle tension, improved flexibility, improved joint mobility, increased ROM, and reduced pain.  Kinesiotape:  L ITB - "I" strip from ITB insertion to proximal to greater trochanter + perpendicular "I" strip crossing at greater trochanter  STM/DTM and manual TPR  to L glutes  GAIT TRAINING: To normalize gait pattern and prepare to wean from AD. 270' with [facility] - cues for increased L hip and knee flexion during  swing phase of gait and heel strike on weight acceptance to promote increased quad activation and knee extension   MODALITIES: Game Ready vasopneumatic compression post session to L knee x 10 min, medium compression, 34 to reduce post-exercise pain and swelling/edema   PATIENT EDUCATION:  Education details: HEP progression and scar tissue mobilization  Person educated: Patient Education method: Programmer, multimedia, Demonstration, Verbal cues, and Handouts Education comprehension: verbalized understanding, returned demonstration, verbal cues required, and needs further education  HOME EXERCISE PROGRAM: Access Code: [email or web address] URL: [email or web address] Date: 07/08/2023 Prepared by: Glenetta Hew  Exercises - Mini Squat with Counter Support  - 1 x daily - 7 x weekly - 2 sets - 10 reps - 3-5 sec hold - Supine Hamstring Stretch with Strap (Mirrored)  - 2-3 x daily - 7 x weekly - 3 reps - 30 sec hold - Supine Iliotibial Band Stretch with Strap (Mirrored)  - 2-3 x daily - 7 x weekly - 3 reps - 30 sec hold - Prone Quad Stretch with Towel Roll and Strap  - 2-3 x daily - 7 x weekly - 3 reps - 30 sec hold - Prone Knee Extension Hang  - 2 x daily - 7 x weekly - 3-5 min hold - Seated Leg Press with Resistance  - 1 x daily - 3 x weekly - 2 sets - 10 reps - 3 sec hold - Standing Terminal Knee Extension with Resistance  - 1 x daily - 3 x weekly - 2 sets - 10 reps - 5 sec hold - Standing ITB Stretch  - 2-3 x daily - 7 x weekly - 3 reps - 30 sec hold - Clam with Resistance (Mirrored)  - 1 x daily - 3 x weekly - 2 sets - 10 reps - 3-5 sec hold - Standing Hip Flexion with Anchored Resistance and Chair Support  - 1 x daily - 3 x weekly - 2 sets - 10 reps - 3 sec hold - Standing Hip Adduction with Anchored Resistance  - 1 x daily - 3 x weekly - 2 sets - 10  reps - 3 sec hold - Standing Hip Extension with Anchored Resistance  - 1 x daily - 3 x weekly - 2 sets - 10 reps - 3 sec hold - Standing Hip Abduction with Anchored Resistance  - 1 x daily - 3 x weekly - 2 sets - 10 reps - 3 sec hold - Seated Hamstring Curl with Anchored Resistance  - 1 x daily - 3 x weekly - 2 sets - 10 reps - 3 sec hold - Sitting Knee Extension with Resistance  - 1 x daily - 3 x weekly - 2 sets - 10 reps - 3-5 sec hold  Patient Education - Kinesiology tape - Scar Massage   ASSESSMENT:  CLINICAL IMPRESSION: Oluwatofunmi reports she has been trying to walk some w/o the [facility] but has noted increased irritation in her L ITB - initial gait w/o AD revealing compensatory R lateral lean during L swing phase but pt able to correct with more symmetrical gait pattern achieved.  She was able to navigate stairs reciprocally but with increased effort noted on L on ascent and limited L eccentric quad control on descent.  Attempted to work on eccentric quad control with lateral and fwd step-downs however pt experiencing a cramp in her distal quads during 2nd set of lateral step-downs.  Relieved with MT followed by gentle quad and hip flexor stretching.  Ktape reapplied for ITB adding perpendicular strip  over distal quads.  Introduced Therapist, art with good tolerance for most exercises but unable to isolate L quads eccentrically due to increased discomfort.  Session concluded with GR vasopneumatic compression to reduce post exercise soreness and edema.  Kharizma will benefit from continued skilled PT to address ongoing abnormal muscle tension, ROM, strength and balance deficits to improve mobility and activity tolerance with decreased pain interference.  OBJECTIVE IMPAIRMENTS: Abnormal gait, decreased activity tolerance, decreased balance, decreased endurance, decreased knowledge of condition, decreased mobility, difficulty walking, decreased ROM, decreased strength, increased edema, increased  fascial restrictions, impaired perceived functional ability, increased muscle spasms, impaired flexibility, improper body mechanics, postural dysfunction, and pain.   ACTIVITY LIMITATIONS: carrying, lifting, bending, sitting, standing, squatting, sleeping, stairs, transfers, bed mobility, bathing, and locomotion level  PARTICIPATION LIMITATIONS: meal prep, cleaning, laundry, driving, shopping, and community activity  PERSONAL FACTORS: Past/current experiences, Time since onset of injury/illness/exacerbation, and 1-2 comorbidities: OA - B knees; B hearing loss; basal cell carcinoma of skin - multiple locations  are also affecting patient's functional outcome.   REHAB POTENTIAL: Excellent  CLINICAL DECISION MAKING: Stable/uncomplicated  EVALUATION COMPLEXITY: Low   GOALS: Goals reviewed with patient? Yes  SHORT TERM GOALS: Target date: 07/20/2023  Patient will be independent with initial HEP. Baseline: Performing HH PT HEP 2x/day - reviewed and modified on eval Goal status: MET - 07/06/23  2.  Patient will report at least 25% improvement in L knee pain to improve QOL. Baseline: 5/10 currently, up to 8-9/10 at worst (typically at night) Goal status: IN PROGRESS  3.  Patient will demonstrate improved L knee AROM to >/= -5-115 to improve gait and stair mechanics. Baseline: L knee AROM -13-105; PROM -4-109 Goal status: IN PROGRESS - 07/06/23 - L knee AROM -8-114 with supported extension down to -2  LONG TERM GOALS: Target date: 08/10/2023  Patient will be independent with advanced/ongoing HEP to improve outcomes and carryover.  Baseline:  Goal status: IN PROGRESS  2.  Patient will report at least 50-75% improvement in L knee pain to improve QOL. Baseline: 5/10 currently, up to 8-9/10 at worst (typically at night) Goal status: IN PROGRESS  3.  Patient will demonstrate improved L knee AROM to >/= -2-120 to allow for normal gait and stair mechanics. Baseline: L knee AROM -13-105;  PROM -4-109 Goal status: IN PROGRESS  4.  Patient will demonstrate improved L LE strength to >/= 4+/5 for improved stability and ease of mobility. Baseline: Refer to above LE MMT table Goal status: IN PROGRESS  5.  Patient will be able to ambulate 600' w/o AD and normal gait pattern without increased pain to access community.  Baseline: Currently ambulating with SPC and mildly antalgic gait pattern Goal status: IN PROGRESS  6. Patient will be able to ascend/descend stairs with 1 HR and reciprocal step pattern safely to access home and community.  Baseline: Mostly step to pattern due to limited L knee ROM and quad control Goal status: IN PROGRESS  7.  Patient will report >/= 50/80 on LEFS to demonstrate improved functional ability. Baseline: 40 / 80 = 50.0 % Goal status: IN PROGRESS  8.  Patient will demonstrate at least 22/24 on DGI to decrease risk of falls. Baseline: 19/24 Goal status: IN PROGRESS    PLAN:  PT FREQUENCY: 2-3x/week  PT DURATION: 6 weeks  PLANNED INTERVENTIONS: 97164- PT Re-evaluation, 97110-Therapeutic exercises, 97530- Therapeutic activity, 97112- Neuromuscular re-education, 97535- Self Care, 14782- Manual therapy, [id number]- Gait training, 97014- Electrical stimulation (unattended), [id number]- Electrical stimulation (  manual), [id number]- Vasopneumatic device, [id number]- Ultrasound, Patient/Family education, Balance training, Stair training, Taping, Dry Needling, Joint mobilization, Scar mobilization, DME instructions, Cryotherapy, and Moist heat  PLAN FOR NEXT SESSION: progress L knee ROM; progress quad and proximal LE strengthening; MT as indicated to address abnormal muscle tension and improve L knee ROM, with reapplication of Kinesiotape as indicated and benefit noted; modalities including vasopneumatic compression as indicated for pain and edema management; weekly ROM reassessment   Marry Guan, PT 07/12/2023, 6:24 PM

## 2023-07-14 ENCOUNTER — Telehealth: Payer: Self-pay | Admitting: *Deleted

## 2023-07-14 NOTE — Telephone Encounter (Signed)
Patient called and says she has a stitch that is coming out along her incision line. She is a month post op from Left total knee. Her f/u is first week of Feb. Do you want her to come in before this to have it looked at? Maybe a nurse visit? Or do you think she'd be ok to wait? Thanks.

## 2023-07-15 ENCOUNTER — Ambulatory Visit: Payer: Medicare HMO | Admitting: Physical Therapy

## 2023-07-15 ENCOUNTER — Encounter: Payer: Self-pay | Admitting: Physical Therapy

## 2023-07-15 DIAGNOSIS — M25662 Stiffness of left knee, not elsewhere classified: Secondary | ICD-10-CM

## 2023-07-15 DIAGNOSIS — R600 Localized edema: Secondary | ICD-10-CM | POA: Diagnosis not present

## 2023-07-15 DIAGNOSIS — M25562 Pain in left knee: Secondary | ICD-10-CM

## 2023-07-15 DIAGNOSIS — M1712 Unilateral primary osteoarthritis, left knee: Secondary | ICD-10-CM | POA: Diagnosis not present

## 2023-07-15 DIAGNOSIS — M6281 Muscle weakness (generalized): Secondary | ICD-10-CM | POA: Diagnosis not present

## 2023-07-15 DIAGNOSIS — R2689 Other abnormalities of gait and mobility: Secondary | ICD-10-CM

## 2023-07-15 NOTE — Telephone Encounter (Signed)
I think as long as nothing looks irritated/infected it is ok to wait

## 2023-07-15 NOTE — Therapy (Signed)
OUTPATIENT PHYSICAL THERAPY TREATMENT   Patient Name: Kendra Le MRN: [id number] DOB:08-25-1955, 68 y.o., female Today's Date: 07/15/2023   END OF SESSION:  PT End of Session - 07/15/23 1012     Visit Number 6    Date for PT Re-Evaluation 08/10/23    Authorization Type [employer]    PT Start Time 1012    PT Stop Time 1104    PT Time Calculation (min) 52 min    Activity Tolerance Patient tolerated treatment well    Behavior During Therapy [facility] for tasks assessed/performed                Past Medical History:  Diagnosis Date   Arthritis    Atypical nevus 05/23/2000   Left Upper Arm-Mild   Atypical nevus 05/25/2006   Right Calf-Moderate   Atypical nevus 01/23/2010   Left Upper Inner Arm-Mild   Atypical nevus 08/06/2015   Left Forearm-Mild   BCC (basal cell carcinoma of skin) 03/31/2004   Right Lower Back (tx p bx)   GERD (gastroesophageal reflux disease)    History of kidney stones 2010   Migraine    PONV (postoperative nausea and vomiting)    Superficial basal cell carcinoma (BCC) 10/15/1999   Lower Post Neck, Left Mid Back,Left Lower Back and Upper Sternum (all Cx3,5FU)   Superficial basal cell carcinoma (BCC) 03/01/2014   Left Post Shoulder (Cx3,5FU)   Superficial basal cell carcinoma (BCC) 06/26/2014   Right Shoulder (Cx3,5FU)   Superficial basal cell carcinoma (BCC) 06/28/2017   Sup Upper Sternum (tx p bx)   Past Surgical History:  Procedure Laterality Date   ABDOMINAL HYSTERECTOMY     ANTERIOR CRUCIATE LIGAMENT REPAIR     left   BREAST SURGERY     CHOLECYSTECTOMY     TOTAL KNEE ARTHROPLASTY Left 06/13/2023   Procedure: LEFT TOTAL KNEE ARTHROPLASTY;  Surgeon: Tarry Kos, MD;  Location: [facility] OR;  Service: Orthopedics;  Laterality: Left;   Patient Active Problem List   Diagnosis Date Noted   Status post total left knee replacement 06/13/2023   Primary osteoarthritis of right knee 09/01/2021   Primary osteoarthritis of left knee 09/01/2021    Ingrown toenail 12/21/2019   Left hip pain 09/04/2017   Right shoulder pain 09/04/2017   Arthralgia of right temporomandibular joint 11/16/2016   Sensorineural hearing loss (SNHL) of both ears 11/16/2016   Tinnitus of left ear 11/16/2016   Synovial cyst 08/07/2015   Trochanteric bursitis of both hips 10/22/2014   Plantar fasciitis, left 12/24/2013   Left knee pain 10/15/2011    PCP: Noberto Retort, MD   REFERRING PROVIDER: Tarry Kos, MD   REFERRING DIAG: [phone] (ICD-10-CM) - Primary osteoarthritis of left knee   THERAPY DIAG:  Acute pain of left knee  Stiffness of left knee, not elsewhere classified  Muscle weakness (generalized)  Other abnormalities of gait and mobility  Localized edema  RATIONALE FOR EVALUATION AND TREATMENT: Rehabilitation  ONSET DATE: 06/13/23 - L TKA  NEXT MD VISIT: 07/26/23   SUBJECTIVE:  SUBJECTIVE STATEMENT: Pt reports she is still trying to walk some w/o the [facility] but limits this because her ITB is still irritated.  EVAL:  S/p L TKA on 06/13/23. HH PT completed 06/21/23.  Uses CPM for ~1 hr at time but limited tolerance due to RLS - CPM due to be returned on Monday.  Pt reports she has been moving around well at home and weaned herself to the [facility] as of yesterday.  Pain not too bad during the day but still interferes with sleep.  She notes a popping feeling which the MD told her might be from her patella or her ITB.  Still competing HH PT HEP 2x/day.  PAIN: Are you having pain? Yes: NPRS scale: 5/10 Pain location: L knee & ITB Pain description: throbbing Aggravating factors: end ROM knee flexion Relieving factors: Tylenol during day, Tylenol #3 and muscle relaxant at night, ice  PERTINENT HISTORY:  OA - B knees s/p L TKA 06/13/23; B hearing  loss; basal cell carcinoma of skin - multiple locations  PRECAUTIONS: None  RED FLAGS: None  WEIGHT BEARING RESTRICTIONS: No  FALLS:  Has patient fallen in last 6 months? No  LIVING ENVIRONMENT: Lives with: lives with their spouse Lives in: House/apartment Stairs: Yes: External: 2 steps; none Has following equipment at home: Single point cane, Walker - 2 wheeled, and bed side commode  OCCUPATION: Retired  PLOF: Independent and Leisure: walking daily 30-45 min, reading  PATIENT GOALS: "Be able to walk normally again and not hurt my other knee."   OBJECTIVE: (objective measures completed at initial evaluation unless otherwise dated)  DIAGNOSTIC FINDINGS:  06/13/23 - DG Left knee: Left knee arthroplasty without immediate postoperative complication.  03/22/23 - XR Left knee: X-rays of the left knee show advanced tricompartmental degenerative joint disease worse in the patellofemoral compartment.   PATIENT SURVEYS:  LEFS 40 / 80 = 50.0 %  COGNITION: Overall cognitive status: Within functional limits for tasks assessed    SENSATION: WFL  EDEMA:  Mild/mod edema in L knee and lower leg  PALPATION: Mildly reduced L patellar mobility, predominantly superiorly/inferiorly  MUSCLE LENGTH:  (07/01/23) Hamstrings: mild tight L ITB: mild tight L Piriformis: mild tight L Hip flexors: WFL Quads: mod tight L Heelcord: mild tight L  LOWER EXTREMITY ROM:  Active ROM Right eval Left eval L 07/06/23 L 07/15/23  Knee flexion 135 105 114 117  Knee extension 0 -13 -8 -4   Passive ROM Left eval L 07/06/23 L 07/15/23  Knee flexion 109  121  Knee extension -4 -2 0  (Blank rows = not tested)  LOWER EXTREMITY MMT:  MMT Right eval Left eval  Hip flexion 5 4-  Hip extension 4+ 4  Hip abduction 5 4  Hip adduction 5 4-  Hip internal rotation 5 4  Hip external rotation 4+ 3+  Knee flexion 5 4  Knee extension 5 4-  Ankle dorsiflexion 5 5  Ankle plantarflexion    Ankle inversion     Ankle eversion     (Blank rows = not tested)  FUNCTIONAL TESTS:  5 times sit to stand: 16.88 sec Timed up and go (TUG): 13.85 sec with SPC Dynamic Gait Index: 19/24  GAIT: Distance walked: clinic distances Assistive device utilized: Single point cane Level of assistance: Modified independence Gait pattern: decreased stance time- Left, decreased stride length, decreased hip/knee flexion- Left, and lateral lean- Right   TODAY'S TREATMENT:   07/15/23 THERAPEUTIC EXERCISE: to improve flexibility, strength and mobility.  Demonstration, verbal and tactile cues throughout for technique.  Rec Bike - L3 x 6 minutes Supine L cross-body ITB stretch with strap 2 x 30" following DN to glutes Supine L SAQ over 8" FR 2# - 2 x 10 Supine L SLR 2# - 2 x 10 Standing L glute medius 45 hip kickback with looped GTB at ankles 2 x 10 Counter squat + GTB hip ABD isometric 2 x 10 Sustained bridge + GTB hip ABD/ER 2 x 10 L single leg bridge x 10  MANUAL THERAPY: To promote normalized muscle tension, improved flexibility, and reduced pain. Trigger Point Dry Needling: Treatment instructions/education: Initial Treatment: Pt instructed on Dry Needling rational, procedures, and possible side effects. Pt instructed to expect mild to moderate muscle soreness later in the day and/or into the next day.  Pt instructed in methods to reduce muscle soreness. Pt instructed to continue prescribed HEP. Patient was educated on signs and symptoms of infection and other risk factors and advised to seek medical attention should they occur.  Patient verbalized understanding of these instructions and education.  Education Handout Provided: Yes Consent: Patient Verbal Consent Given: Yes Treatment: Muscles Treated: L lateral glute medius and minimus Skilled palpation and monitoring of soft tissue during DN Electrical Stimulation Performed: No Treatment Response/Outcome: Twitch Response Elicited, Palpable Increase in  Muscle Length, Decreased TTP, and Improved Exercise Tolerance STM/DTM, manual TPR and pin & stretch to muscles addressed with DN  MODALITIES: Game Ready vasopneumatic compression post session to L knee x 10 min, high compression, 34 to reduce post-exercise pain and swelling/edema   07/12/23 GAIT TRAINING: To normalize gait pattern and prepare to wean from AD. 400 ft w/o AD - initially demonstrating increased R lateral lean with decreased weight shift to L LE but normalizing with increased distance Stairs: Level of Assistance: Modified independence Stair Negotiation Technique: Alternating Pattern  Forwards with Single Rail on Left Number of Stairs: 14  Height of Stairs: 7"  Comments: decreased L eccentric quad control   THERAPEUTIC EXERCISE: to improve flexibility, strength and mobility.  Demonstration, verbal and tactile cues throughout for technique.  L lateral eccentric lowering from 6" with light toe touch to floor x 10, on 2nd set pt experienced a cramp in her distal quads, 2nd set completed after MT and stretching w/o incident Prone L quad stretch with strap 3 x 30-60" - last rep with towel roll under knee for RF stretch Mod thomas L quad/hip flexor stretch with strap 2 x 60" L forward eccentric lowering from 4" with light toe touch to floor x 10 BATCA leg press 20# 2 x 10 BATCA knee flexion B con/ecc 25#x 10, B con/L ecc 20# 2 x 10 BATCA knee extension B con/ecc 15# 2 x 10, attempted B con/L ecc 10# but deferred d/t increased distal quad pain  MANUAL THERAPY: To promote normalized muscle tension, improved flexibility, and reduced pain. STM/DTM and manual TPR to L distal mid and lateral quads  Kinesiotape:  L ITB - "I" strip from ITB insertion to proximal to greater trochanter + perpendicular "I" strips crossing at greater trochanter and distal quads  MODALITIES: Game Ready vasopneumatic compression post session to L knee x 10 min, high compression, 34 to reduce post-exercise  pain and swelling/edema   07/08/23 THERAPEUTIC EXERCISE: to improve flexibility, strength and mobility.  Demonstration, verbal and tactile cues throughout for technique.  NuStep - L5 x 6 min (UE/LE) for warm-up to promote tissue perfusion and reduce knee joint stiffness SLS + opp  LE GTB 4-way SLR x 10, UE support on back of chair for balance - cues for quad activation maintaining L knee straight as well as avoidance of accessory trunk/pelvic motion Seated GTB L HS curls 2 x 10 Seated GTB L LAQ 2 x 10 TRX squats 2 x 10 TRX lateral lunge x 10 bil  MANUAL THERAPY: To promote normalized muscle tension, improved flexibility, improved joint mobility, increased ROM, and reduced pain. STM/STM and IASTM with edge tool to medial (VM) and mid distal quads (VI/RF) STM/DTM and manual TPR to L mid and proximal lateral quads (VL), medial hamstrings and hip adductors L patellar mobs all directions Instruction provided in incisional scar massage/mobilization avoiding areas where steri-strips or scabbing still present  MODALITIES: Game Ready vasopneumatic compression post session to L knee x 10 min, high compression, 34 to reduce post-exercise pain and swelling/edema   PATIENT EDUCATION:  Education details: role of DN and DN rational, procedure, outcomes, potential side effects, and recommended post-treatment exercises/activity  Person educated: Patient Education method: Explanation and Handouts Education comprehension: verbalized understanding  HOME EXERCISE PROGRAM: Access Code: [email or web address] URL: [email or web address] Date: 07/08/2023 Prepared by: Glenetta Hew  Exercises - Mini Squat with Counter Support  - 1 x daily - 7 x weekly - 2 sets - 10 reps - 3-5 sec hold - Supine Hamstring Stretch with Strap (Mirrored)  - 2-3 x daily - 7 x weekly - 3 reps - 30 sec hold - Supine Iliotibial Band Stretch with Strap (Mirrored)  - 2-3 x daily - 7 x weekly - 3 reps - 30 sec hold - Prone Quad  Stretch with Towel Roll and Strap  - 2-3 x daily - 7 x weekly - 3 reps - 30 sec hold - Prone Knee Extension Hang  - 2 x daily - 7 x weekly - 3-5 min hold - Seated Leg Press with Resistance  - 1 x daily - 3 x weekly - 2 sets - 10 reps - 3 sec hold - Standing Terminal Knee Extension with Resistance  - 1 x daily - 3 x weekly - 2 sets - 10 reps - 5 sec hold - Standing ITB Stretch  - 2-3 x daily - 7 x weekly - 3 reps - 30 sec hold - Clam with Resistance (Mirrored)  - 1 x daily - 3 x weekly - 2 sets - 10 reps - 3-5 sec hold - Standing Hip Flexion with Anchored Resistance and Chair Support  - 1 x daily - 3 x weekly - 2 sets - 10 reps - 3 sec hold - Standing Hip Adduction with Anchored Resistance  - 1 x daily - 3 x weekly - 2 sets - 10 reps - 3 sec hold - Standing Hip Extension with Anchored Resistance  - 1 x daily - 3 x weekly - 2 sets - 10 reps - 3 sec hold - Standing Hip Abduction with Anchored Resistance  - 1 x daily - 3 x weekly - 2 sets - 10 reps - 3 sec hold - Seated Hamstring Curl with Anchored Resistance  - 1 x daily - 3 x weekly - 2 sets - 10 reps - 3 sec hold - Sitting Knee Extension with Resistance  - 1 x daily - 3 x weekly - 2 sets - 10 reps - 3-5 sec hold  Patient Education - Kinesiology tape - Scar Massage   ASSESSMENT:  CLINICAL IMPRESSION: Shanvi continues to report L ITB pain limiting tolerance for weaning from [facility]. Considerable increased muscle  tension and TTP identified in L lateral glutes which appeared amenable to TPDN.  After explanation of DN rational, procedures, outcomes and potential side effects, patient verbalized consent to DN treatment in conjunction with manual STM/DTM and TPR to reduce ttp/muscle tension. Muscles treated as indicated above. DN produced normal response with good twitches elicited resulting in palpable reduction in pain/ttp and muscle tension. Pt educated to expect mild to moderate muscle soreness for up to 24-48 hrs and instructed to continue prescribed  HEP and current activity level with pt verbalizing understanding of these instructions. L knee ROM continues to improve with current AROM -4-117 and PROM 0-121. Therapeutic exercises continuing to focus on quad and glute strengthening for improved stability and functional mobility.  Tris will benefit from continued skilled PT to address ongoing abnormal muscle tension, ROM, strength and balance deficits to improve mobility and activity tolerance with decreased pain interference.  OBJECTIVE IMPAIRMENTS: Abnormal gait, decreased activity tolerance, decreased balance, decreased endurance, decreased knowledge of condition, decreased mobility, difficulty walking, decreased ROM, decreased strength, increased edema, increased fascial restrictions, impaired perceived functional ability, increased muscle spasms, impaired flexibility, improper body mechanics, postural dysfunction, and pain.   ACTIVITY LIMITATIONS: carrying, lifting, bending, sitting, standing, squatting, sleeping, stairs, transfers, bed mobility, bathing, and locomotion level  PARTICIPATION LIMITATIONS: meal prep, cleaning, laundry, driving, shopping, and community activity  PERSONAL FACTORS: Past/current experiences, Time since onset of injury/illness/exacerbation, and 1-2 comorbidities: OA - B knees; B hearing loss; basal cell carcinoma of skin - multiple locations  are also affecting patient's functional outcome.   REHAB POTENTIAL: Excellent  CLINICAL DECISION MAKING: Stable/uncomplicated  EVALUATION COMPLEXITY: Low   GOALS: Goals reviewed with patient? Yes  SHORT TERM GOALS: Target date: 07/20/2023  Patient will be independent with initial HEP. Baseline: Performing HH PT HEP 2x/day - reviewed and modified on eval Goal status: MET - 07/06/23  2.  Patient will report at least 25% improvement in L knee pain to improve QOL. Baseline: 5/10 currently, up to 8-9/10 at worst (typically at night) Goal status: IN PROGRESS  3.   Patient will demonstrate improved L knee AROM to >/= -5-115 to improve gait and stair mechanics. Baseline: L knee AROM -13-105; PROM -4-109 Goal status: MET - 07/15/23 - L knee AROM -4-117 with supported extension down to 0  LONG TERM GOALS: Target date: 08/10/2023  Patient will be independent with advanced/ongoing HEP to improve outcomes and carryover.  Baseline:  Goal status: IN PROGRESS  2.  Patient will report at least 50-75% improvement in L knee pain to improve QOL. Baseline: 5/10 currently, up to 8-9/10 at worst (typically at night) Goal status: IN PROGRESS  3.  Patient will demonstrate improved L knee AROM to >/= -2-120 to allow for normal gait and stair mechanics. Baseline: L knee AROM -13-105; PROM -4-109 Goal status: IN PROGRESS - 07/15/23 - L knee AROM -4-117 with supported extension down to 0  4.  Patient will demonstrate improved L LE strength to >/= 4+/5 for improved stability and ease of mobility. Baseline: Refer to above LE MMT table Goal status: IN PROGRESS  5.  Patient will be able to ambulate 600' w/o AD and normal gait pattern without increased pain to access community.  Baseline: Currently ambulating with SPC and mildly antalgic gait pattern Goal status: IN PROGRESS  6. Patient will be able to ascend/descend stairs with 1 HR and reciprocal step pattern safely to access home and community.  Baseline: Mostly step to pattern due to limited L knee ROM  and quad control Goal status: IN PROGRESS  7.  Patient will report >/= 50/80 on LEFS to demonstrate improved functional ability. Baseline: 40 / 80 = 50.0 % Goal status: IN PROGRESS  8.  Patient will demonstrate at least 22/24 on DGI to decrease risk of falls. Baseline: 19/24 Goal status: IN PROGRESS    PLAN:  PT FREQUENCY: 2-3x/week  PT DURATION: 6 weeks  PLANNED INTERVENTIONS: 97164- PT Re-evaluation, 97110-Therapeutic exercises, 97530- Therapeutic activity, 97112- Neuromuscular re-education, 97535-  Self Care, 16109- Manual therapy, [phone]- Gait training, 97014- Electrical stimulation (unattended), [phone]- Electrical stimulation (manual), 97016- Vasopneumatic device, 97035- Ultrasound, Patient/Family education, Balance training, Stair training, Taping, Dry Needling, Joint mobilization, Scar mobilization, DME instructions, Cryotherapy, and Moist heat  PLAN FOR NEXT SESSION: assess response to TPDN; re-assess LE MMT; STG #2 assessment; progress L knee ROM; progress quad and proximal LE strengthening; MT as indicated to address abnormal muscle tension and improve L knee ROM, with reapplication of Kinesiotape as indicated and benefit noted; modalities including vasopneumatic compression as indicated for pain and edema management; weekly ROM reassessment   Marry Guan, PT 07/15/2023, 12:51 PM

## 2023-07-19 ENCOUNTER — Ambulatory Visit: Payer: Medicare HMO | Admitting: Physical Therapy

## 2023-07-19 ENCOUNTER — Encounter: Payer: Self-pay | Admitting: Physical Therapy

## 2023-07-19 DIAGNOSIS — R2689 Other abnormalities of gait and mobility: Secondary | ICD-10-CM | POA: Diagnosis not present

## 2023-07-19 DIAGNOSIS — R600 Localized edema: Secondary | ICD-10-CM | POA: Diagnosis not present

## 2023-07-19 DIAGNOSIS — M6281 Muscle weakness (generalized): Secondary | ICD-10-CM

## 2023-07-19 DIAGNOSIS — M1712 Unilateral primary osteoarthritis, left knee: Secondary | ICD-10-CM | POA: Diagnosis not present

## 2023-07-19 DIAGNOSIS — M25662 Stiffness of left knee, not elsewhere classified: Secondary | ICD-10-CM | POA: Diagnosis not present

## 2023-07-19 DIAGNOSIS — M25562 Pain in left knee: Secondary | ICD-10-CM

## 2023-07-19 NOTE — Therapy (Signed)
OUTPATIENT PHYSICAL THERAPY TREATMENT   Patient Name: Kendra Le MRN: [id number] DOB:[birth year], 68 y.o., female Today's Date: 07/19/2023   END OF SESSION:  PT End of Session - 07/19/23 1015     Visit Number 7    Date for PT Re-Evaluation 08/10/23    Authorization Type [employer]    PT Start Time 1015    PT Stop Time 1109    PT Time Calculation (min) 54 min    Activity Tolerance Patient tolerated treatment well    Behavior During Therapy [facility] for tasks assessed/performed                 Past Medical History:  Diagnosis Date   Arthritis    Atypical nevus 05/23/2000   Left Upper Arm-Mild   Atypical nevus 05/25/2006   Right Calf-Moderate   Atypical nevus 01/23/2010   Left Upper Inner Arm-Mild   Atypical nevus 08/06/2015   Left Forearm-Mild   BCC (basal cell carcinoma of skin) 03/31/2004   Right Lower Back (tx p bx)   GERD (gastroesophageal reflux disease)    History of kidney stones 2010   Migraine    PONV (postoperative nausea and vomiting)    Superficial basal cell carcinoma (BCC) 10/15/1999   Lower Post Neck, Left Mid Back,Left Lower Back and Upper Sternum (all Cx3,5FU)   Superficial basal cell carcinoma (BCC) 03/01/2014   Left Post Shoulder (Cx3,5FU)   Superficial basal cell carcinoma (BCC) 06/26/2014   Right Shoulder (Cx3,5FU)   Superficial basal cell carcinoma (BCC) 06/28/2017   Sup Upper Sternum (tx p bx)   Past Surgical History:  Procedure Laterality Date   ABDOMINAL HYSTERECTOMY     ANTERIOR CRUCIATE LIGAMENT REPAIR     left   BREAST SURGERY     CHOLECYSTECTOMY     TOTAL KNEE ARTHROPLASTY Left 06/13/2023   Procedure: LEFT TOTAL KNEE ARTHROPLASTY;  Surgeon: Tarry Kos, MD;  Location: [facility] OR;  Service: Orthopedics;  Laterality: Left;   Patient Active Problem List   Diagnosis Date Noted   Status post total left knee replacement 06/13/2023   Primary osteoarthritis of right knee 09/01/2021   Primary osteoarthritis of left knee 09/01/2021    Ingrown toenail 12/21/2019   Left hip pain 09/04/2017   Right shoulder pain 09/04/2017   Arthralgia of right temporomandibular joint 11/16/2016   Sensorineural hearing loss (SNHL) of both ears 11/16/2016   Tinnitus of left ear 11/16/2016   Synovial cyst 08/07/2015   Trochanteric bursitis of both hips 10/22/2014   Plantar fasciitis, left 12/24/2013   Left knee pain 10/15/2011    PCP: Noberto Retort, MD   REFERRING PROVIDER: Tarry Kos, MD   REFERRING DIAG: [phone] (ICD-10-CM) - Primary osteoarthritis of left knee   THERAPY DIAG:  Acute pain of left knee  Stiffness of left knee, not elsewhere classified  Muscle weakness (generalized)  Other abnormalities of gait and mobility  Localized edema  RATIONALE FOR EVALUATION AND TREATMENT: Rehabilitation  ONSET DATE: 06/13/23 - L TKA  NEXT MD VISIT: 07/26/23   SUBJECTIVE:  SUBJECTIVE STATEMENT: Pt reports good benefit from the TPDN last visit with decreased ITB pain since.  EVAL:  S/p L TKA on 06/13/23. HH PT completed 06/21/23.  Uses CPM for ~1 hr at time but limited tolerance due to RLS - CPM due to be returned on Monday.  Pt reports she has been moving around well at home and weaned herself to the [facility] as of yesterday.  Pain not too bad during the day but still interferes with sleep.  She notes a popping feeling which the MD told her might be from her patella or her ITB.  Still competing HH PT HEP 2x/day.  PAIN: Are you having pain? Yes: NPRS scale: 3/10 Pain location: L knee & ITB Pain description: throbbing Aggravating factors: end ROM knee flexion Relieving factors: Tylenol during day, Tylenol #3 and muscle relaxant at night, ice  PERTINENT HISTORY:  OA - B knees s/p L TKA 06/13/23; B hearing loss; basal cell carcinoma  of skin - multiple locations  PRECAUTIONS: None  RED FLAGS: None  WEIGHT BEARING RESTRICTIONS: No  FALLS:  Has patient fallen in last 6 months? No  LIVING ENVIRONMENT: Lives with: lives with their spouse Lives in: House/apartment Stairs: Yes: External: 2 steps; none Has following equipment at home: Single point cane, Walker - 2 wheeled, and bed side commode  OCCUPATION: Retired  PLOF: Independent and Leisure: walking daily 30-45 min, reading  PATIENT GOALS: "Be able to walk normally again and not hurt my other knee."   OBJECTIVE: (objective measures completed at initial evaluation unless otherwise dated)  DIAGNOSTIC FINDINGS:  06/13/23 - DG Left knee: Left knee arthroplasty without immediate postoperative complication.  03/22/23 - XR Left knee: X-rays of the left knee show advanced tricompartmental degenerative joint disease worse in the patellofemoral compartment.   PATIENT SURVEYS:  LEFS 40 / 80 = 50.0 %  COGNITION: Overall cognitive status: Within functional limits for tasks assessed    SENSATION: WFL  EDEMA:  Mild/mod edema in L knee and lower leg  PALPATION: Mildly reduced L patellar mobility, predominantly superiorly/inferiorly  MUSCLE LENGTH:  (07/01/23) Hamstrings: mild tight L ITB: mild tight L Piriformis: mild tight L Hip flexors: WFL Quads: mod tight L Heelcord: mild tight L  LOWER EXTREMITY ROM:  Active ROM Right eval Left eval L 07/06/23 L 07/15/23  Knee flexion 135 105 114 117  Knee extension 0 -13 -8 -4   Passive ROM Left eval L 07/06/23 L 07/15/23  Knee flexion 109  121  Knee extension -4 -2 0  (Blank rows = not tested)  LOWER EXTREMITY MMT:  MMT Right eval Left eval R 07/19/23 L 07/19/23  Hip flexion 5 4- 5 4+  Hip extension 4+ 4 5 4+  Hip abduction 5 4 4+ 4  Hip adduction 5 4- 5 4+  Hip internal rotation 5 4 5  4+  Hip external rotation 4+ 3+ 4+ 4-  Knee flexion 5 4 5  4+  Knee extension 5 4- 4+ 4+  Ankle dorsiflexion 5 5 5 5    Ankle plantarflexion      Ankle inversion      Ankle eversion       (Blank rows = not tested)  FUNCTIONAL TESTS:  5 times sit to stand: 16.88 sec Timed up and go (TUG): 13.85 sec with SPC Dynamic Gait Index: 19/24  GAIT: Distance walked: clinic distances Assistive device utilized: Single point cane Level of assistance: Modified independence Gait pattern: decreased stance time- Left, decreased stride length, decreased hip/knee flexion-  Left, and lateral lean- Right   TODAY'S TREATMENT:   07/19/23 THERAPEUTIC EXERCISE: to improve flexibility, strength and mobility.  Demonstration, verbal and tactile cues throughout for technique.  Rec Bike - L3 x 6 minutes L fwd step-up to 6" step + blue TB TKE x 10 L lateral step-up to 6" step + blue TB TKEx 10 L lateral eccentric lowering from 6" with light toe touch to floor x 10  L forward eccentric lowering from 6" with light toe touch to floor x 10  B side-stepping with looped GTB at ankles 2 x 15 ft Fwd/back monster walk with looped GTB at ankles 2 x 15 ft Bridge + GTB hip ABD/ER x 10 Sustained bridge + GTB hip ABD/ER x 10 Sustained bridge + GTB hip flexion march x 10 L single leg bridge 2 x 10  THERAPEUTIC ACTIVITIES:  LE MMT  MANUAL THERAPY: To promote normalized muscle tension, improved flexibility, improved joint mobility, increased ROM, and reduced pain. L patellar mobs - all directions L incisional scar massage avoiding 2 remaining areas of slight scabbing as well as protruding stitch STM/DTM, manual TPR and pin & stretch to L mid/distal ITB  MODALITIES:  Game Ready vasopneumatic compression post session to L knee x 10 min, high compression, 34 to reduce post-exercise pain and swelling/edema   07/15/23 THERAPEUTIC EXERCISE: to improve flexibility, strength and mobility.  Demonstration, verbal and tactile cues throughout for technique.  Rec Bike - L3 x 6 minutes Supine L cross-body ITB stretch with strap 2 x 30" following DN  to glutes Supine L SAQ over 8" FR 2# - 2 x 10 Supine L SLR 2# - 2 x 10 Standing L glute medius 45 hip kickback with looped GTB at ankles 2 x 10 Counter squat + GTB hip ABD isometric 2 x 10 Sustained bridge + GTB hip ABD/ER 2 x 10 L single leg bridge x 10  MANUAL THERAPY: To promote normalized muscle tension, improved flexibility, and reduced pain. Trigger Point Dry Needling: Treatment instructions/education: Initial Treatment: Pt instructed on Dry Needling rational, procedures, and possible side effects. Pt instructed to expect mild to moderate muscle soreness later in the day and/or into the next day.  Pt instructed in methods to reduce muscle soreness. Pt instructed to continue prescribed HEP. Patient was educated on signs and symptoms of infection and other risk factors and advised to seek medical attention should they occur.  Patient verbalized understanding of these instructions and education.  Education Handout Provided: Yes Consent: Patient Verbal Consent Given: Yes Treatment: Muscles Treated: L lateral glute medius and minimus Skilled palpation and monitoring of soft tissue during DN Electrical Stimulation Performed: No Treatment Response/Outcome: Twitch Response Elicited, Palpable Increase in Muscle Length, Decreased TTP, and Improved Exercise Tolerance STM/DTM, manual TPR and pin & stretch to muscles addressed with DN  MODALITIES: Game Ready vasopneumatic compression post session to L knee x 10 min, high compression, 34 to reduce post-exercise pain and swelling/edema   07/12/23 GAIT TRAINING: To normalize gait pattern and prepare to wean from AD. 400 ft w/o AD - initially demonstrating increased R lateral lean with decreased weight shift to L LE but normalizing with increased distance Stairs: Level of Assistance: Modified independence Stair Negotiation Technique: Alternating Pattern  Forwards with Single Rail on Left Number of Stairs: 14  Height of Stairs:  7"  Comments: decreased L eccentric quad control   THERAPEUTIC EXERCISE: to improve flexibility, strength and mobility.  Demonstration, verbal and tactile cues throughout for technique.  L lateral eccentric lowering from 6" with light toe touch to floor x 10, on 2nd set pt experienced a cramp in her distal quads, 2nd set completed after MT and stretching w/o incident Prone L quad stretch with strap 3 x 30-60" - last rep with towel roll under knee for RF stretch Mod thomas L quad/hip flexor stretch with strap 2 x 60" L forward eccentric lowering from 4" with light toe touch to floor x 10 BATCA leg press 20# 2 x 10 BATCA knee flexion B con/ecc 25#x 10, B con/L ecc 20# 2 x 10 BATCA knee extension B con/ecc 15# 2 x 10, attempted B con/L ecc 10# but deferred d/t increased distal quad pain  MANUAL THERAPY: To promote normalized muscle tension, improved flexibility, and reduced pain. STM/DTM and manual TPR to L distal mid and lateral quads  Kinesiotape:  L ITB - "I" strip from ITB insertion to proximal to greater trochanter + perpendicular "I" strips crossing at greater trochanter and distal quads  MODALITIES: Game Ready vasopneumatic compression post session to L knee x 10 min, high compression, 34 to reduce post-exercise pain and swelling/edema   PATIENT EDUCATION:  Education details: progress with PT, ongoing PT POC, and continue with current HEP  Person educated: Patient Education method: Explanation Education comprehension: verbalized understanding  HOME EXERCISE PROGRAM: Access Code: [email or web address] URL: [email or web address] Date: 07/08/2023 Prepared by: Glenetta Hew  Exercises - Mini Squat with Counter Support  - 1 x daily - 7 x weekly - 2 sets - 10 reps - 3-5 sec hold - Supine Hamstring Stretch with Strap (Mirrored)  - 2-3 x daily - 7 x weekly - 3 reps - 30 sec hold - Supine Iliotibial Band Stretch with Strap (Mirrored)  - 2-3 x daily - 7 x weekly - 3 reps - 30 sec  hold - Prone Quad Stretch with Towel Roll and Strap  - 2-3 x daily - 7 x weekly - 3 reps - 30 sec hold - Prone Knee Extension Hang  - 2 x daily - 7 x weekly - 3-5 min hold - Seated Leg Press with Resistance  - 1 x daily - 3 x weekly - 2 sets - 10 reps - 3 sec hold - Standing Terminal Knee Extension with Resistance  - 1 x daily - 3 x weekly - 2 sets - 10 reps - 5 sec hold - Standing ITB Stretch  - 2-3 x daily - 7 x weekly - 3 reps - 30 sec hold - Clam with Resistance (Mirrored)  - 1 x daily - 3 x weekly - 2 sets - 10 reps - 3-5 sec hold - Standing Hip Flexion with Anchored Resistance and Chair Support  - 1 x daily - 3 x weekly - 2 sets - 10 reps - 3 sec hold - Standing Hip Adduction with Anchored Resistance  - 1 x daily - 3 x weekly - 2 sets - 10 reps - 3 sec hold - Standing Hip Extension with Anchored Resistance  - 1 x daily - 3 x weekly - 2 sets - 10 reps - 3 sec hold - Standing Hip Abduction with Anchored Resistance  - 1 x daily - 3 x weekly - 2 sets - 10 reps - 3 sec hold - Seated Hamstring Curl with Anchored Resistance  - 1 x daily - 3 x weekly - 2 sets - 10 reps - 3 sec hold - Sitting Knee Extension with Resistance  - 1 x daily - 3 x weekly -  2 sets - 10 reps - 3-5 sec hold  Patient Education - Kinesiology tape - Scar Massage   ASSESSMENT:  CLINICAL IMPRESSION: Khylie reports benefit from TPDN with reduction in L ITB pain, although a few latent TPs identified upon palpation today.  Able to resume step-up/step-down exercises with no further cramping triggered in quads.  Overall L LE strength improving with greatest remaining weakness still in lateral hip musculature, L>R.  Good tolerance for continued strengthening progression with some fatigue noted but no recurrence of muscle cramping experience with previous attempts at step up and step downs.  Jodiann will benefit from continued skilled PT to address ongoing abnormal muscle tension, ROM, strength and balance deficits to improve mobility  and activity tolerance with decreased pain interference.  OBJECTIVE IMPAIRMENTS: Abnormal gait, decreased activity tolerance, decreased balance, decreased endurance, decreased knowledge of condition, decreased mobility, difficulty walking, decreased ROM, decreased strength, increased edema, increased fascial restrictions, impaired perceived functional ability, increased muscle spasms, impaired flexibility, improper body mechanics, postural dysfunction, and pain.   ACTIVITY LIMITATIONS: carrying, lifting, bending, sitting, standing, squatting, sleeping, stairs, transfers, bed mobility, bathing, and locomotion level  PARTICIPATION LIMITATIONS: meal prep, cleaning, laundry, driving, shopping, and community activity  PERSONAL FACTORS: Past/current experiences, Time since onset of injury/illness/exacerbation, and 1-2 comorbidities: OA - B knees; B hearing loss; basal cell carcinoma of skin - multiple locations  are also affecting patient's functional outcome.   REHAB POTENTIAL: Excellent  CLINICAL DECISION MAKING: Stable/uncomplicated  EVALUATION COMPLEXITY: Low   GOALS: Goals reviewed with patient? Yes  SHORT TERM GOALS: Target date: 07/20/2023  Patient will be independent with initial HEP. Baseline: Performing HH PT HEP 2x/day - reviewed and modified on eval Goal status: MET - 07/06/23  2.  Patient will report at least 25% improvement in L knee pain to improve QOL. Baseline: 5/10 currently, up to 8-9/10 at worst (typically at night) Goal status: MET - 07/19/23 - 50% improvement  3.  Patient will demonstrate improved L knee AROM to >/= -5-115 to improve gait and stair mechanics. Baseline: L knee AROM -13-105; PROM -4-109 Goal status: MET - 07/15/23 - L knee AROM -4-117 with supported extension down to 0  LONG TERM GOALS: Target date: 08/10/2023  Patient will be independent with advanced/ongoing HEP to improve outcomes and carryover.  Baseline:  Goal status: IN PROGRESS  2.   Patient will report at least 50-75% improvement in L knee pain to improve QOL. Baseline: 5/10 currently, up to 8-9/10 at worst (typically at night) Goal status: IN PROGRESS - 07/19/23 - 50% improvement  3.  Patient will demonstrate improved L knee AROM to >/= -2-120 to allow for normal gait and stair mechanics. Baseline: L knee AROM -13-105; PROM -4-109 Goal status: IN PROGRESS - 07/15/23 - L knee AROM -4-117 with supported extension down to 0  4.  Patient will demonstrate improved L LE strength to >/= 4+/5 for improved stability and ease of mobility. Baseline: Refer to above LE MMT table Goal status: IN PROGRESS  5.  Patient will be able to ambulate 600' w/o AD and normal gait pattern without increased pain to access community.  Baseline: Currently ambulating with SPC and mildly antalgic gait pattern Goal status: IN PROGRESS  6. Patient will be able to ascend/descend stairs with 1 HR and reciprocal step pattern safely to access home and community.  Baseline: Mostly step to pattern due to limited L knee ROM and quad control Goal status: IN PROGRESS - 07/12/23 - able to navigate stairs reciprocally  with single HR however limited eccentric quad control noted on descent  7.  Patient will report >/= 50/80 on LEFS to demonstrate improved functional ability. Baseline: 40 / 80 = 50.0 % Goal status: IN PROGRESS  8.  Patient will demonstrate at least 22/24 on DGI to decrease risk of falls. Baseline: 19/24 Goal status: IN PROGRESS    PLAN:  PT FREQUENCY: 2-3x/week  PT DURATION: 6 weeks  PLANNED INTERVENTIONS: 97164- PT Re-evaluation, 97110-Therapeutic exercises, 97530- Therapeutic activity, 97112- Neuromuscular re-education, 97535- Self Care, [id number]- Manual therapy, [id number]- Gait training, 97014- Electrical stimulation (unattended), [phone]- Electrical stimulation (manual), 97016- Vasopneumatic device, 97035- Ultrasound, Patient/Family education, Balance training, Stair training, Taping, Dry  Needling, Joint mobilization, Scar mobilization, DME instructions, Cryotherapy, and Moist heat  PLAN FOR NEXT SESSION: MD PN for 07/26/23 appt; progress L knee ROM; progress quad and proximal LE strengthening; MT +/- TPDN as indicated to address abnormal muscle tension and improve L knee ROM, with reapplication of Kinesiotape as indicated and benefit noted; modalities including vasopneumatic compression as indicated for pain and edema management; weekly ROM reassessment   Marry Guan, PT 07/19/2023, 11:18 AM

## 2023-07-19 NOTE — Patient Instructions (Signed)

## 2023-07-21 ENCOUNTER — Ambulatory Visit: Payer: Medicare HMO

## 2023-07-21 DIAGNOSIS — M6281 Muscle weakness (generalized): Secondary | ICD-10-CM | POA: Diagnosis not present

## 2023-07-21 DIAGNOSIS — M25662 Stiffness of left knee, not elsewhere classified: Secondary | ICD-10-CM | POA: Diagnosis not present

## 2023-07-21 DIAGNOSIS — R2689 Other abnormalities of gait and mobility: Secondary | ICD-10-CM | POA: Diagnosis not present

## 2023-07-21 DIAGNOSIS — M25562 Pain in left knee: Secondary | ICD-10-CM

## 2023-07-21 DIAGNOSIS — R600 Localized edema: Secondary | ICD-10-CM | POA: Diagnosis not present

## 2023-07-21 DIAGNOSIS — M1712 Unilateral primary osteoarthritis, left knee: Secondary | ICD-10-CM | POA: Diagnosis not present

## 2023-07-21 NOTE — Therapy (Signed)
OUTPATIENT PHYSICAL THERAPY TREATMENT   Patient Name: Kendra Le MRN: [id number] DOB:October 13, 1955, 68 y.o., female Today's Date: 07/21/2023   END OF SESSION:  PT End of Session - 07/21/23 1104     Visit Number 8    Date for PT Re-Evaluation 08/10/23    Authorization Type [employer]    PT Start Time 1020    PT Stop Time 1112    PT Time Calculation (min) 52 min    Activity Tolerance Patient tolerated treatment well    Behavior During Therapy [facility] for tasks assessed/performed                  Past Medical History:  Diagnosis Date   Arthritis    Atypical nevus 05/23/2000   Left Upper Arm-Mild   Atypical nevus 05/25/2006   Right Calf-Moderate   Atypical nevus 01/23/2010   Left Upper Inner Arm-Mild   Atypical nevus 08/06/2015   Left Forearm-Mild   BCC (basal cell carcinoma of skin) 03/31/2004   Right Lower Back (tx p bx)   GERD (gastroesophageal reflux disease)    History of kidney stones 2010   Migraine    PONV (postoperative nausea and vomiting)    Superficial basal cell carcinoma (BCC) 10/15/1999   Lower Post Neck, Left Mid Back,Left Lower Back and Upper Sternum (all Cx3,5FU)   Superficial basal cell carcinoma (BCC) 03/01/2014   Left Post Shoulder (Cx3,5FU)   Superficial basal cell carcinoma (BCC) 06/26/2014   Right Shoulder (Cx3,5FU)   Superficial basal cell carcinoma (BCC) 06/28/2017   Sup Upper Sternum (tx p bx)   Past Surgical History:  Procedure Laterality Date   ABDOMINAL HYSTERECTOMY     ANTERIOR CRUCIATE LIGAMENT REPAIR     left   BREAST SURGERY     CHOLECYSTECTOMY     TOTAL KNEE ARTHROPLASTY Left 06/13/2023   Procedure: LEFT TOTAL KNEE ARTHROPLASTY;  Surgeon: Tarry Kos, MD;  Location: [facility] OR;  Service: Orthopedics;  Laterality: Left;   Patient Active Problem List   Diagnosis Date Noted   Status post total left knee replacement 06/13/2023   Primary osteoarthritis of right knee 09/01/2021   Primary osteoarthritis of left knee  09/01/2021   Ingrown toenail 12/21/2019   Left hip pain 09/04/2017   Right shoulder pain 09/04/2017   Arthralgia of right temporomandibular joint 11/16/2016   Sensorineural hearing loss (SNHL) of both ears 11/16/2016   Tinnitus of left ear 11/16/2016   Synovial cyst 08/07/2015   Trochanteric bursitis of both hips 10/22/2014   Plantar fasciitis, left 12/24/2013   Left knee pain 10/15/2011    PCP: Noberto Retort, MD   REFERRING PROVIDER: Tarry Kos, MD   REFERRING DIAG: [id number] (ICD-10-CM) - Primary osteoarthritis of left knee   THERAPY DIAG:  Acute pain of left knee  Stiffness of left knee, not elsewhere classified  Muscle weakness (generalized)  Other abnormalities of gait and mobility  Localized edema  RATIONALE FOR EVALUATION AND TREATMENT: Rehabilitation  ONSET DATE: 06/13/23 - L TKA  NEXT MD VISIT: 07/26/23   SUBJECTIVE:  SUBJECTIVE STATEMENT: Throbbing pain in L knee, nothing more than normal  EVAL:  S/p L TKA on 06/13/23. HH PT completed 06/21/23.  Uses CPM for ~1 hr at time but limited tolerance due to RLS - CPM due to be returned on Monday.  Pt reports she has been moving around well at home and weaned herself to the [facility] as of yesterday.  Pain not too bad during the day but still interferes with sleep.  She notes a popping feeling which the MD told her might be from her patella or her ITB.  Still competing HH PT HEP 2x/day.  PAIN: Are you having pain? Yes: NPRS scale: 4/10 Pain location: L knee & ITB Pain description: throbbing Aggravating factors: end ROM knee flexion Relieving factors: Tylenol during day, Tylenol #3 and muscle relaxant at night, ice  PERTINENT HISTORY:  OA - B knees s/p L TKA 06/13/23; B hearing loss; basal cell carcinoma of skin - multiple  locations  PRECAUTIONS: None  RED FLAGS: None  WEIGHT BEARING RESTRICTIONS: No  FALLS:  Has patient fallen in last 6 months? No  LIVING ENVIRONMENT: Lives with: lives with their spouse Lives in: House/apartment Stairs: Yes: External: 2 steps; none Has following equipment at home: Single point cane, Walker - 2 wheeled, and bed side commode  OCCUPATION: Retired  PLOF: Independent and Leisure: walking daily 30-45 min, reading  PATIENT GOALS: "Be able to walk normally again and not hurt my other knee."   OBJECTIVE: (objective measures completed at initial evaluation unless otherwise dated)  DIAGNOSTIC FINDINGS:  06/13/23 - DG Left knee: Left knee arthroplasty without immediate postoperative complication.  03/22/23 - XR Left knee: X-rays of the left knee show advanced tricompartmental degenerative joint disease worse in the patellofemoral compartment.   PATIENT SURVEYS:  LEFS 40 / 80 = 50.0 %  COGNITION: Overall cognitive status: Within functional limits for tasks assessed    SENSATION: WFL  EDEMA:  Mild/mod edema in L knee and lower leg  PALPATION: Mildly reduced L patellar mobility, predominantly superiorly/inferiorly  MUSCLE LENGTH:  (07/01/23) Hamstrings: mild tight L ITB: mild tight L Piriformis: mild tight L Hip flexors: WFL Quads: mod tight L Heelcord: mild tight L  LOWER EXTREMITY ROM:  Active ROM Right eval Left eval L 07/06/23 L 07/15/23 L 07/21/23  Knee flexion 135 105 114 117 120- supine  Knee extension 0 -13 -8 -4 0- supported in supine   Passive ROM Left eval L 07/06/23 L 07/15/23  Knee flexion 109  121  Knee extension -4 -2 0  (Blank rows = not tested)  LOWER EXTREMITY MMT:  MMT Right eval Left eval R 07/19/23 L 07/19/23  Hip flexion 5 4- 5 4+  Hip extension 4+ 4 5 4+  Hip abduction 5 4 4+ 4  Hip adduction 5 4- 5 4+  Hip internal rotation 5 4 5  4+  Hip external rotation 4+ 3+ 4+ 4-  Knee flexion 5 4 5  4+  Knee extension 5 4- 4+ 4+  Ankle  dorsiflexion 5 5 5 5   Ankle plantarflexion      Ankle inversion      Ankle eversion       (Blank rows = not tested)  FUNCTIONAL TESTS:  5 times sit to stand: 16.88 sec Timed up and go (TUG): 13.85 sec with SPC Dynamic Gait Index: 19/24  GAIT: Distance walked: clinic distances Assistive device utilized: Single point cane Level of assistance: Modified independence Gait pattern: decreased stance time- Left, decreased stride length,  decreased hip/knee flexion- Left, and lateral lean- Right   TODAY'S TREATMENT:  07/21/23 THERAPEUTIC EXERCISE: to improve flexibility, strength and mobility.  Demonstration, verbal and tactile cues throughout for technique. Recumebtn Bike L3x83min L fwd step-up to 6" step + blue TB TKE 2 x 10 L lateral step-up to 6" step + blue TB TKE 2 x 10 L forward eccentric lowering from 4" with light toe touch to floor 2 x 10  Standing hip abduction x 10 BLE GTB Standing hip extension x 10 BLE GTB Squats GTB 2 x 10  Supine ITB stretch strap 2x30" Supine KTOS and figure 4 stretch 2x30"  MANUAL THERAPY: To promote normalized muscle tension, improved flexibility, improved joint mobility, increased ROM, and reduced pain. IASTM s/s tool to ITB, VL 07/19/23 THERAPEUTIC EXERCISE: to improve flexibility, strength and mobility.  Demonstration, verbal and tactile cues throughout for technique.  Rec Bike - L3 x 6 minutes L fwd step-up to 6" step + blue TB TKE x 10 L lateral step-up to 6" step + blue TB TKEx 10 L lateral eccentric lowering from 6" with light toe touch to floor x 10  L forward eccentric lowering from 6" with light toe touch to floor x 10  B side-stepping with looped GTB at ankles 2 x 15 ft Fwd/back monster walk with looped GTB at ankles 2 x 15 ft Bridge + GTB hip ABD/ER x 10 Sustained bridge + GTB hip ABD/ER x 10 Sustained bridge + GTB hip flexion march x 10 L single leg bridge 2 x 10  THERAPEUTIC ACTIVITIES:  LE MMT  MANUAL THERAPY: To promote  normalized muscle tension, improved flexibility, improved joint mobility, increased ROM, and reduced pain. L patellar mobs - all directions L incisional scar massage avoiding 2 remaining areas of slight scabbing as well as protruding stitch STM/DTM, manual TPR and pin & stretch to L mid/distal ITB  MODALITIES:  Game Ready vasopneumatic compression post session to L knee x 10 min, high compression, 34 to reduce post-exercise pain and swelling/edema   07/15/23 THERAPEUTIC EXERCISE: to improve flexibility, strength and mobility.  Demonstration, verbal and tactile cues throughout for technique.  Rec Bike - L3 x 6 minutes Supine L cross-body ITB stretch with strap 2 x 30" following DN to glutes Supine L SAQ over 8" FR 2# - 2 x 10 Supine L SLR 2# - 2 x 10 Standing L glute medius 45 hip kickback with looped GTB at ankles 2 x 10 Counter squat + GTB hip ABD isometric 2 x 10 Sustained bridge + GTB hip ABD/ER 2 x 10 L single leg bridge x 10  MANUAL THERAPY: To promote normalized muscle tension, improved flexibility, and reduced pain. Trigger Point Dry Needling: Treatment instructions/education: Initial Treatment: Pt instructed on Dry Needling rational, procedures, and possible side effects. Pt instructed to expect mild to moderate muscle soreness later in the day and/or into the next day.  Pt instructed in methods to reduce muscle soreness. Pt instructed to continue prescribed HEP. Patient was educated on signs and symptoms of infection and other risk factors and advised to seek medical attention should they occur.  Patient verbalized understanding of these instructions and education.  Education Handout Provided: Yes Consent: Patient Verbal Consent Given: Yes Treatment: Muscles Treated: L lateral glute medius and minimus Skilled palpation and monitoring of soft tissue during DN Electrical Stimulation Performed: No Treatment Response/Outcome: Twitch Response Elicited, Palpable Increase in  Muscle Length, Decreased TTP, and Improved Exercise Tolerance STM/DTM, manual TPR and pin &  stretch to muscles addressed with DN  MODALITIES: Game Ready vasopneumatic compression post session to L knee x 10 min, high compression, 34 to reduce post-exercise pain and swelling/edema   07/12/23 GAIT TRAINING: To normalize gait pattern and prepare to wean from AD. 400 ft w/o AD - initially demonstrating increased R lateral lean with decreased weight shift to L LE but normalizing with increased distance Stairs: Level of Assistance: Modified independence Stair Negotiation Technique: Alternating Pattern  Forwards with Single Rail on Left Number of Stairs: 14  Height of Stairs: 7"  Comments: decreased L eccentric quad control   THERAPEUTIC EXERCISE: to improve flexibility, strength and mobility.  Demonstration, verbal and tactile cues throughout for technique.  L lateral eccentric lowering from 6" with light toe touch to floor x 10, on 2nd set pt experienced a cramp in her distal quads, 2nd set completed after MT and stretching w/o incident Prone L quad stretch with strap 3 x 30-60" - last rep with towel roll under knee for RF stretch Mod thomas L quad/hip flexor stretch with strap 2 x 60" L forward eccentric lowering from 4" with light toe touch to floor x 10 BATCA leg press 20# 2 x 10 BATCA knee flexion B con/ecc 25#x 10, B con/L ecc 20# 2 x 10 BATCA knee extension B con/ecc 15# 2 x 10, attempted B con/L ecc 10# but deferred d/t increased distal quad pain  MANUAL THERAPY: To promote normalized muscle tension, improved flexibility, and reduced pain. STM/DTM and manual TPR to L distal mid and lateral quads  Kinesiotape:  L ITB - "I" strip from ITB insertion to proximal to greater trochanter + perpendicular "I" strips crossing at greater trochanter and distal quads  MODALITIES: Game Ready vasopneumatic compression post session to L knee x 10 min, high compression, 34 to reduce post-exercise  pain and swelling/edema   PATIENT EDUCATION:  Education details: progress with PT, ongoing PT POC, and continue with current HEP  Person educated: Patient Education method: Explanation Education comprehension: verbalized understanding  HOME EXERCISE PROGRAM: Access Code: [email or web address] URL: [email or web address] Date: 07/08/2023 Prepared by: Glenetta Hew  Exercises - Mini Squat with Counter Support  - 1 x daily - 7 x weekly - 2 sets - 10 reps - 3-5 sec hold - Supine Hamstring Stretch with Strap (Mirrored)  - 2-3 x daily - 7 x weekly - 3 reps - 30 sec hold - Supine Iliotibial Band Stretch with Strap (Mirrored)  - 2-3 x daily - 7 x weekly - 3 reps - 30 sec hold - Prone Quad Stretch with Towel Roll and Strap  - 2-3 x daily - 7 x weekly - 3 reps - 30 sec hold - Prone Knee Extension Hang  - 2 x daily - 7 x weekly - 3-5 min hold - Seated Leg Press with Resistance  - 1 x daily - 3 x weekly - 2 sets - 10 reps - 3 sec hold - Standing Terminal Knee Extension with Resistance  - 1 x daily - 3 x weekly - 2 sets - 10 reps - 5 sec hold - Standing ITB Stretch  - 2-3 x daily - 7 x weekly - 3 reps - 30 sec hold - Clam with Resistance (Mirrored)  - 1 x daily - 3 x weekly - 2 sets - 10 reps - 3-5 sec hold - Standing Hip Flexion with Anchored Resistance and Chair Support  - 1 x daily - 3 x weekly - 2 sets - 10 reps - 3  sec hold - Standing Hip Adduction with Anchored Resistance  - 1 x daily - 3 x weekly - 2 sets - 10 reps - 3 sec hold - Standing Hip Extension with Anchored Resistance  - 1 x daily - 3 x weekly - 2 sets - 10 reps - 3 sec hold - Standing Hip Abduction with Anchored Resistance  - 1 x daily - 3 x weekly - 2 sets - 10 reps - 3 sec hold - Seated Hamstring Curl with Anchored Resistance  - 1 x daily - 3 x weekly - 2 sets - 10 reps - 3 sec hold - Sitting Knee Extension with Resistance  - 1 x daily - 3 x weekly - 2 sets - 10 reps - 3-5 sec hold  Patient Education - Kinesiology tape - Scar  Massage   ASSESSMENT:  CLINICAL IMPRESSION: Pt with no complaints other than L ITB discomfort throughout session. This improved after the MT and stretching. Pt demonstrates decreased eccentric control with L knee. She shows good gait pattern other than a waddling type of pattern which is mostly from her L hip. Vaso applied post session to address pain. Adriauna will benefit from continued skilled PT to address ongoing abnormal muscle tension, ROM, strength and balance deficits to improve mobility and activity tolerance with decreased pain interference.  OBJECTIVE IMPAIRMENTS: Abnormal gait, decreased activity tolerance, decreased balance, decreased endurance, decreased knowledge of condition, decreased mobility, difficulty walking, decreased ROM, decreased strength, increased edema, increased fascial restrictions, impaired perceived functional ability, increased muscle spasms, impaired flexibility, improper body mechanics, postural dysfunction, and pain.   ACTIVITY LIMITATIONS: carrying, lifting, bending, sitting, standing, squatting, sleeping, stairs, transfers, bed mobility, bathing, and locomotion level  PARTICIPATION LIMITATIONS: meal prep, cleaning, laundry, driving, shopping, and community activity  PERSONAL FACTORS: Past/current experiences, Time since onset of injury/illness/exacerbation, and 1-2 comorbidities: OA - B knees; B hearing loss; basal cell carcinoma of skin - multiple locations  are also affecting patient's functional outcome.   REHAB POTENTIAL: Excellent  CLINICAL DECISION MAKING: Stable/uncomplicated  EVALUATION COMPLEXITY: Low   GOALS: Goals reviewed with patient? Yes  SHORT TERM GOALS: Target date: 07/20/2023  Patient will be independent with initial HEP. Baseline: Performing HH PT HEP 2x/day - reviewed and modified on eval Goal status: MET - 07/06/23  2.  Patient will report at least 25% improvement in L knee pain to improve QOL. Baseline: 5/10 currently, up to  8-9/10 at worst (typically at night) Goal status: MET - 07/19/23 - 50% improvement  3.  Patient will demonstrate improved L knee AROM to >/= -5-115 to improve gait and stair mechanics. Baseline: L knee AROM -13-105; PROM -4-109 Goal status: MET - 07/15/23 - L knee AROM -4-117 with supported extension down to 0  LONG TERM GOALS: Target date: 08/10/2023  Patient will be independent with advanced/ongoing HEP to improve outcomes and carryover.  Baseline:  Goal status: IN PROGRESS  2.  Patient will report at least 50-75% improvement in L knee pain to improve QOL. Baseline: 5/10 currently, up to 8-9/10 at worst (typically at night) Goal status: IN PROGRESS - 07/19/23 - 50% improvement  3.  Patient will demonstrate improved L knee AROM to >/= -2-120 to allow for normal gait and stair mechanics. Baseline: L knee AROM -13-105; PROM -4-109 Goal status: IN PROGRESS - 07/21/23 - L knee AROM 0-122 supine with supported extension  4.  Patient will demonstrate improved L LE strength to >/= 4+/5 for improved stability and ease of mobility. Baseline: Refer  to above LE MMT table Goal status: IN PROGRESS  5.  Patient will be able to ambulate 600' w/o AD and normal gait pattern without increased pain to access community.  Baseline: Currently ambulating with SPC and mildly antalgic gait pattern Goal status: IN PROGRESS  6. Patient will be able to ascend/descend stairs with 1 HR and reciprocal step pattern safely to access home and community.  Baseline: Mostly step to pattern due to limited L knee ROM and quad control Goal status: IN PROGRESS - 07/12/23 - able to navigate stairs reciprocally with single HR however limited eccentric quad control noted on descent  7.  Patient will report >/= 50/80 on LEFS to demonstrate improved functional ability. Baseline: 40 / 80 = 50.0 % Goal status: IN PROGRESS  8.  Patient will demonstrate at least 22/24 on DGI to decrease risk of falls. Baseline: 19/24 Goal  status: IN PROGRESS    PLAN:  PT FREQUENCY: 2-3x/week  PT DURATION: 6 weeks  PLANNED INTERVENTIONS: 97164- PT Re-evaluation, 97110-Therapeutic exercises, 97530- Therapeutic activity, 97112- Neuromuscular re-education, 97535- Self Care, [id number]- Manual therapy, [id number]- Gait training, 97014- Electrical stimulation (unattended), [phone]- Electrical stimulation (manual), 97016- Vasopneumatic device, 97035- Ultrasound, Patient/Family education, Balance training, Stair training, Taping, Dry Needling, Joint mobilization, Scar mobilization, DME instructions, Cryotherapy, and Moist heat  PLAN FOR NEXT SESSION: MD PN for 07/26/23 appt; progress L knee ROM; progress quad and proximal LE strengthening; MT +/- TPDN as indicated to address abnormal muscle tension and improve L knee ROM, with reapplication of Kinesiotape as indicated and benefit noted; modalities including vasopneumatic compression as indicated for pain and edema management; weekly ROM reassessment   Nohemy Koop L Ann-Marie Kluge, PTA 07/21/2023, 11:12 AM

## 2023-07-26 ENCOUNTER — Ambulatory Visit: Payer: Medicare HMO | Attending: Orthopaedic Surgery | Admitting: Physical Therapy

## 2023-07-26 ENCOUNTER — Encounter: Payer: Self-pay | Admitting: Physical Therapy

## 2023-07-26 ENCOUNTER — Encounter: Payer: PPO | Admitting: Physician Assistant

## 2023-07-26 DIAGNOSIS — R2689 Other abnormalities of gait and mobility: Secondary | ICD-10-CM | POA: Insufficient documentation

## 2023-07-26 DIAGNOSIS — M25562 Pain in left knee: Secondary | ICD-10-CM | POA: Diagnosis present

## 2023-07-26 DIAGNOSIS — R600 Localized edema: Secondary | ICD-10-CM | POA: Diagnosis present

## 2023-07-26 DIAGNOSIS — M6281 Muscle weakness (generalized): Secondary | ICD-10-CM | POA: Insufficient documentation

## 2023-07-26 DIAGNOSIS — M25662 Stiffness of left knee, not elsewhere classified: Secondary | ICD-10-CM | POA: Diagnosis present

## 2023-07-26 NOTE — Therapy (Signed)
 OUTPATIENT PHYSICAL THERAPY TREATMENT   Patient Name: Kendra Le MRN: [id number] DOB:Jan 22, 1956, 68 y.o., female Today's Date: 07/26/2023   END OF SESSION:  PT End of Session - 07/26/23 1015     Visit Number 9    Date for PT Re-Evaluation 08/10/23    Authorization Type [employer]    PT Start Time 1015    PT Stop Time 1110    PT Time Calculation (min) 55 min    Activity Tolerance Patient tolerated treatment well    Behavior During Therapy [facility] for tasks assessed/performed                   Past Medical History:  Diagnosis Date   Arthritis    Atypical nevus 05/23/2000   Left Upper Arm-Mild   Atypical nevus 05/25/2006   Right Calf-Moderate   Atypical nevus 01/23/2010   Left Upper Inner Arm-Mild   Atypical nevus 08/06/2015   Left Forearm-Mild   BCC (basal cell carcinoma of skin) 03/31/2004   Right Lower Back (tx p bx)   GERD (gastroesophageal reflux disease)    History of kidney stones 2010   Migraine    PONV (postoperative nausea and vomiting)    Superficial basal cell carcinoma (BCC) 10/15/1999   Lower Post Neck, Left Mid Back,Left Lower Back and Upper Sternum (all Cx3,5FU)   Superficial basal cell carcinoma (BCC) 03/01/2014   Left Post Shoulder (Cx3,5FU)   Superficial basal cell carcinoma (BCC) 06/26/2014   Right Shoulder (Cx3,5FU)   Superficial basal cell carcinoma (BCC) 06/28/2017   Sup Upper Sternum (tx p bx)   Past Surgical History:  Procedure Laterality Date   ABDOMINAL HYSTERECTOMY     ANTERIOR CRUCIATE LIGAMENT REPAIR     left   BREAST SURGERY     CHOLECYSTECTOMY     TOTAL KNEE ARTHROPLASTY Left 06/13/2023   Procedure: LEFT TOTAL KNEE ARTHROPLASTY;  Surgeon: Jerri Kay HERO, MD;  Location: [facility] OR;  Service: Orthopedics;  Laterality: Left;   Patient Active Problem List   Diagnosis Date Noted   Status post total left knee replacement 06/13/2023   Primary osteoarthritis of right knee 09/01/2021   Primary osteoarthritis of left knee  09/01/2021   Ingrown toenail 12/21/2019   Left hip pain 09/04/2017   Right shoulder pain 09/04/2017   Arthralgia of right temporomandibular joint 11/16/2016   Sensorineural hearing loss (SNHL) of both ears 11/16/2016   Tinnitus of left ear 11/16/2016   Synovial cyst 08/07/2015   Trochanteric bursitis of both hips 10/22/2014   Plantar fasciitis, left 12/24/2013   Left knee pain 10/15/2011    PCP: Arloa Elsie SAUNDERS, MD   REFERRING PROVIDER: Jerri Kay HERO, MD   REFERRING DIAG: [phone] (ICD-10-CM) - Primary osteoarthritis of left knee   THERAPY DIAG:  Acute pain of left knee  Stiffness of left knee, not elsewhere classified  Muscle weakness (generalized)  Other abnormalities of gait and mobility  Localized edema  RATIONALE FOR EVALUATION AND TREATMENT: Rehabilitation  ONSET DATE: 06/13/23 - L TKA  NEXT MD VISIT: 07/28/23   SUBJECTIVE:  SUBJECTIVE STATEMENT: Pt feels that her ITB issues are improving - still there but better.  MD appointment postponed to 07/28/23 due to MD illness.  EVAL:  S/p L TKA on 06/13/23. HH PT completed 06/21/23.  Uses CPM for ~1 hr at time but limited tolerance due to RLS - CPM due to be returned on Monday.  Pt reports she has been moving around well at home and weaned herself to the [facility] as of yesterday.  Pain not too bad during the day but still interferes with sleep.  She notes a popping feeling which the MD told her might be from her patella or her ITB.  Still competing HH PT HEP 2x/day.  PAIN: Are you having pain? Yes: NPRS scale: 3/10 Pain location: L knee  Pain description: throbbing Aggravating factors: end ROM knee flexion Relieving factors: Tylenol  during day, Tylenol  #3 and muscle relaxant at night, ice  PERTINENT HISTORY:  OA - B knees s/p L  TKA 06/13/23; B hearing loss; basal cell carcinoma of skin - multiple locations  PRECAUTIONS: None  RED FLAGS: None  WEIGHT BEARING RESTRICTIONS: No  FALLS:  Has patient fallen in last 6 months? No  LIVING ENVIRONMENT: Lives with: lives with their spouse Lives in: House/apartment Stairs: Yes: External: 2 steps; none Has following equipment at home: Single point cane, Walker - 2 wheeled, and bed side commode  OCCUPATION: Retired  PLOF: Independent and Leisure: walking daily 30-45 min, reading  PATIENT GOALS: Be able to walk normally again and not hurt my other knee.   OBJECTIVE: (objective measures completed at initial evaluation unless otherwise dated)  DIAGNOSTIC FINDINGS:  06/13/23 - DG Left knee: Left knee arthroplasty without immediate postoperative complication.  03/22/23 - XR Left knee: X-rays of the left knee show advanced tricompartmental degenerative joint disease worse in the patellofemoral compartment.   PATIENT SURVEYS:  LEFS 40 / 80 = 50.0 %  COGNITION: Overall cognitive status: Within functional limits for tasks assessed    SENSATION: WFL  EDEMA:  Mild/mod edema in L knee and lower leg  PALPATION: Mildly reduced L patellar mobility, predominantly superiorly/inferiorly  MUSCLE LENGTH:  (07/01/23) Hamstrings: mild tight L ITB: mild tight L Piriformis: mild tight L Hip flexors: WFL Quads: mod tight L Heelcord: mild tight L  LOWER EXTREMITY ROM:  Active ROM Right eval Left eval L 07/06/23 L 07/15/23 L 07/21/23  Knee flexion 135 105 114 117 120  supine  Knee extension 0 -13 -8 -4 0 supported in supine   Passive ROM Left eval L 07/06/23 L 07/15/23  Knee flexion 109  121  Knee extension -4 -2 0  (Blank rows = not tested)  LOWER EXTREMITY MMT:  MMT Right eval Left eval R 07/19/23 L 07/19/23  Hip flexion 5 4- 5 4+  Hip extension 4+ 4 5 4+  Hip abduction 5 4 4+ 4  Hip adduction 5 4- 5 4+  Hip internal rotation 5 4 5  4+  Hip external rotation 4+  3+ 4+ 4-  Knee flexion 5 4 5  4+  Knee extension 5 4- 4+ 4+  Ankle dorsiflexion 5 5 5 5   Ankle plantarflexion      Ankle inversion      Ankle eversion       (Blank rows = not tested)  FUNCTIONAL TESTS:  5 times sit to stand: 16.88 sec Timed up and go (TUG): 13.85 sec with SPC Dynamic Gait Index: 19/24  GAIT: Distance walked: clinic distances Assistive device utilized: Single point cane  Level of assistance: Modified independence Gait pattern: decreased stance time- Left, decreased stride length, decreased hip/knee flexion- Left, and lateral lean- Right   TODAY'S TREATMENT:   07/26/23 THERAPEUTIC EXERCISE: to improve flexibility, strength and mobility.  Demonstration, verbal and tactile cues throughout for technique.  Rec Bike - L4 x 6 minutes Seated L LE blue TB leg press 2 x 10 BATCA knee flexion B con/ecc 25# x 10, B con/L ecc 20# 2 x 10 BATCA knee extension B con/ecc 20# x 10, B con/L ecc 15# 2 x 10  GAIT TRAINING: To normalize gait pattern and improve safety with stair negotiation . Stairs: Level of Assistance: Complete Independence and Modified independence Stair Negotiation Technique: Alternating Pattern Forwards with intermittent Single Rail on Right Number of Stairs: 14  Height of Stairs: 7  Comments: Increased effort required to raise body weight on L LE during ascent and decreased L eccentric quad control evident on descent  THERAPEUTIC ACTIVITIES: To improve functional performance.  Demonstration, verbal and tactile cues throughout for technique. Lateral L eccentric step-touch downs from 6 step x 10 Fwd L eccentric step-touch downs from 6 step x 10  NEUROMUSCULAR RE-EDUCATION: To improve balance, coordination, kinesthetic sense, proprioception, and amplitude of movement. L SLS + 5-way toe tap to colored dots 2 x 5 sets, 2nd set with L blue TB TKE and tactile cues from PT at hips to avoid fwd translation of body SLS hip ABD isometric into ball on wall 10 x 3  bil Standing hip ABD/ER isometric into ball on wall 10 x 3 bil L fwd step-up/down to 6 step + blue TB TKE 2 x 10 Wall squat + GTB hip ABD isometric 2 x 10  MODALITIES:  Game Ready vasopneumatic compression post session to L knee x 10 min, high compression, 34 to reduce post-exercise pain and swelling/edema   07/21/23 THERAPEUTIC EXERCISE: to improve flexibility, strength and mobility.  Demonstration, verbal and tactile cues throughout for technique. Recumebtn Bike L3x19min L fwd step-up to 6 step + blue TB TKE 2 x 10 L lateral step-up to 6 step + blue TB TKE 2 x 10 L forward eccentric lowering from 4 with light toe touch to floor 2 x 10  Standing hip abduction x 10 BLE GTB Standing hip extension x 10 BLE GTB Squats GTB 2 x 10  Supine ITB stretch strap 2x30 Supine KTOS and figure 4 stretch 2x30  MANUAL THERAPY: To promote normalized muscle tension, improved flexibility, improved joint mobility, increased ROM, and reduced pain. IASTM s/s tool to ITB, VL   07/19/23 THERAPEUTIC EXERCISE: to improve flexibility, strength and mobility.  Demonstration, verbal and tactile cues throughout for technique.  Rec Bike - L3 x 6 minutes L fwd step-up to 6 step + blue TB TKE x 10 L lateral step-up to 6 step + blue TB TKEx 10 L lateral eccentric lowering from 6 with light toe touch to floor x 10  L forward eccentric lowering from 6 with light toe touch to floor x 10  B side-stepping with looped GTB at ankles 2 x 15 ft Fwd/back monster walk with looped GTB at ankles 2 x 15 ft Bridge + GTB hip ABD/ER x 10 Sustained bridge + GTB hip ABD/ER x 10 Sustained bridge + GTB hip flexion march x 10 L single leg bridge 2 x 10  THERAPEUTIC ACTIVITIES:  LE MMT  MANUAL THERAPY: To promote normalized muscle tension, improved flexibility, improved joint mobility, increased ROM, and reduced pain. L patellar mobs -  all directions L incisional scar massage avoiding 2 remaining areas of slight scabbing  as well as protruding stitch STM/DTM, manual TPR and pin & stretch to L mid/distal ITB  MODALITIES:  Game Ready vasopneumatic compression post session to L knee x 10 min, high compression, 34 to reduce post-exercise pain and swelling/edema   PATIENT EDUCATION:  Education details: progress with PT, ongoing PT POC, and continue with current HEP  Person educated: Patient Education method: Explanation Education comprehension: verbalized understanding  HOME EXERCISE PROGRAM: Access Code: [email or web address] URL: [email or web address] Date: 07/08/2023 Prepared by: Elijah Hidden  Exercises - Mini Squat with Counter Support  - 1 x daily - 7 x weekly - 2 sets - 10 reps - 3-5 sec hold - Supine Hamstring Stretch with Strap (Mirrored)  - 2-3 x daily - 7 x weekly - 3 reps - 30 sec hold - Supine Iliotibial Band Stretch with Strap (Mirrored)  - 2-3 x daily - 7 x weekly - 3 reps - 30 sec hold - Prone Quad Stretch with Towel Roll and Strap  - 2-3 x daily - 7 x weekly - 3 reps - 30 sec hold - Prone Knee Extension Hang  - 2 x daily - 7 x weekly - 3-5 min hold - Seated Leg Press with Resistance  - 1 x daily - 3 x weekly - 2 sets - 10 reps - 3 sec hold - Standing Terminal Knee Extension with Resistance  - 1 x daily - 3 x weekly - 2 sets - 10 reps - 5 sec hold - Standing ITB Stretch  - 2-3 x daily - 7 x weekly - 3 reps - 30 sec hold - Clam with Resistance (Mirrored)  - 1 x daily - 3 x weekly - 2 sets - 10 reps - 3-5 sec hold - Standing Hip Flexion with Anchored Resistance and Chair Support  - 1 x daily - 3 x weekly - 2 sets - 10 reps - 3 sec hold - Standing Hip Adduction with Anchored Resistance  - 1 x daily - 3 x weekly - 2 sets - 10 reps - 3 sec hold - Standing Hip Extension with Anchored Resistance  - 1 x daily - 3 x weekly - 2 sets - 10 reps - 3 sec hold - Standing Hip Abduction with Anchored Resistance  - 1 x daily - 3 x weekly - 2 sets - 10 reps - 3 sec hold - Seated Hamstring Curl with Anchored  Resistance  - 1 x daily - 3 x weekly - 2 sets - 10 reps - 3 sec hold - Sitting Knee Extension with Resistance  - 1 x daily - 3 x weekly - 2 sets - 10 reps - 3-5 sec hold  Patient Education - Kinesiology tape - Scar Massage   ASSESSMENT:  CLINICAL IMPRESSION: Mirakle is pleased with her progress this far.  L ITB pain/tightness is improving and L knee ROM progressing as anticipated.  Functionally she still demonstrates increased difficulty with stair negotiation due to weakness and limited eccentric quad control.  Strengthening targeting hip and quad strength/control for improved stability and ease of stair management as well as to further reduce ITB limitation/impact on functional mobility.  Tacarra will benefit from continued skilled PT to address ongoing abnormal muscle tension, ROM, strength and balance deficits to improve mobility and activity tolerance with decreased pain interference.  OBJECTIVE IMPAIRMENTS: Abnormal gait, decreased activity tolerance, decreased balance, decreased endurance, decreased knowledge of condition, decreased mobility, difficulty walking, decreased ROM,  decreased strength, increased edema, increased fascial restrictions, impaired perceived functional ability, increased muscle spasms, impaired flexibility, improper body mechanics, postural dysfunction, and pain.   ACTIVITY LIMITATIONS: carrying, lifting, bending, sitting, standing, squatting, sleeping, stairs, transfers, bed mobility, bathing, and locomotion level  PARTICIPATION LIMITATIONS: meal prep, cleaning, laundry, driving, shopping, and community activity  PERSONAL FACTORS: Past/current experiences, Time since onset of injury/illness/exacerbation, and 1-2 comorbidities: OA - B knees; B hearing loss; basal cell carcinoma of skin - multiple locations  are also affecting patient's functional outcome.   REHAB POTENTIAL: Excellent  CLINICAL DECISION MAKING: Stable/uncomplicated  EVALUATION COMPLEXITY:  Low   GOALS: Goals reviewed with patient? Yes  SHORT TERM GOALS: Target date: 07/20/2023  Patient will be independent with initial HEP. Baseline: Performing HH PT HEP 2x/day - reviewed and modified on eval Goal status: MET - 07/06/23  2.  Patient will report at least 25% improvement in L knee pain to improve QOL. Baseline: 5/10 currently, up to 8-9/10 at worst (typically at night) Goal status: MET - 07/19/23 - 50% improvement  3.  Patient will demonstrate improved L knee AROM to >/= -5-115 to improve gait and stair mechanics. Baseline: L knee AROM -13-105; PROM -4-109 Goal status: MET - 07/15/23 - L knee AROM -4-117 with supported extension down to 0  LONG TERM GOALS: Target date: 08/10/2023  Patient will be independent with advanced/ongoing HEP to improve outcomes and carryover.  Baseline:  Goal status: IN PROGRESS  2.  Patient will report at least 50-75% improvement in L knee pain to improve QOL. Baseline: 5/10 currently, up to 8-9/10 at worst (typically at night) Goal status: IN PROGRESS - 07/19/23 - 50% improvement  3.  Patient will demonstrate improved L knee AROM to >/= -2-120 to allow for normal gait and stair mechanics. Baseline: L knee AROM -13-105; PROM -4-109 Goal status: IN PROGRESS - 07/21/23 - L knee AROM 0-122 supine with supported extension  4.  Patient will demonstrate improved L LE strength to >/= 4+/5 for improved stability and ease of mobility. Baseline: Refer to above LE MMT table Goal status: IN PROGRESS  5.  Patient will be able to ambulate 600' w/o AD and normal gait pattern without increased pain to access community.  Baseline: Currently ambulating with SPC and mildly antalgic gait pattern Goal status: IN PROGRESS  6. Patient will be able to ascend/descend stairs with 1 HR and reciprocal step pattern safely to access home and community.  Baseline: Mostly step to pattern due to limited L knee ROM and quad control Goal status: IN PROGRESS - 07/12/23  - able to navigate stairs reciprocally with single HR however limited eccentric quad control noted on descent  7.  Patient will report >/= 50/80 on LEFS to demonstrate improved functional ability. Baseline: 40 / 80 = 50.0 % Goal status: IN PROGRESS  8.  Patient will demonstrate at least 22/24 on DGI to decrease risk of falls. Baseline: 19/24 Goal status: IN PROGRESS    PLAN:  PT FREQUENCY: 2-3x/week  PT DURATION: 6 weeks  PLANNED INTERVENTIONS: 97164- PT Re-evaluation, 97110-Therapeutic exercises, 97530- Therapeutic activity, 97112- Neuromuscular re-education, 97535- Self Care, [id number]- Manual therapy, [id number]- Gait training, 97014- Electrical stimulation (unattended), [id number]- Electrical stimulation (manual), 97016- Vasopneumatic device, 97035- Ultrasound, Patient/Family education, Balance training, Stair training, Taping, Dry Needling, Joint mobilization, Scar mobilization, DME instructions, Cryotherapy, and Moist heat  PLAN FOR NEXT SESSION: 10th visit PN; f/u from MD visit on 07/28/23; progress L knee ROM; progress quad and proximal LE strengthening; MT +/-  TPDN as indicated to address abnormal muscle tension and improve L knee ROM, with reapplication of Kinesiotape as indicated and benefit noted; modalities including vasopneumatic compression as indicated for pain and edema management; weekly ROM reassessment   Elijah CHRISTELLA Hidden, PT 07/26/2023, 1:05 PM

## 2023-07-28 ENCOUNTER — Other Ambulatory Visit (INDEPENDENT_AMBULATORY_CARE_PROVIDER_SITE_OTHER): Payer: Medicare HMO

## 2023-07-28 ENCOUNTER — Ambulatory Visit: Payer: PPO | Admitting: Physician Assistant

## 2023-07-28 ENCOUNTER — Encounter: Payer: Self-pay | Admitting: Physician Assistant

## 2023-07-28 ENCOUNTER — Other Ambulatory Visit (HOSPITAL_BASED_OUTPATIENT_CLINIC_OR_DEPARTMENT_OTHER): Payer: Self-pay

## 2023-07-28 DIAGNOSIS — Z96652 Presence of left artificial knee joint: Secondary | ICD-10-CM

## 2023-07-28 MED ORDER — DICLOFENAC SODIUM 75 MG PO TBEC
75.0000 mg | DELAYED_RELEASE_TABLET | Freq: Two times a day (BID) | ORAL | 2 refills | Status: AC | PRN
Start: 2023-07-28 — End: ?
  Filled 2023-07-28: qty 60, 30d supply, fill #0
  Filled 2023-10-10: qty 60, 30d supply, fill #1
  Filled 2023-12-05: qty 60, 30d supply, fill #2

## 2023-07-28 NOTE — Progress Notes (Signed)
 Post-Op Visit Note   Patient: Kendra Le           Date of Birth: Oct 30, 1955           MRN: [id number] Visit Date: 07/28/2023 PCP: Arloa Elsie SAUNDERS, MD   Assessment & Plan:  Chief Complaint:  Chief Complaint  Patient presents with   Left Knee - Follow-up    Left total knee arthroplasty 06/13/2023   Visit Diagnoses:  1. Status post total left knee replacement     Plan: Patient is a pleasant 68 year old female who comes in today 6 weeks status post left total knee replacement 06/13/2023.  She has been doing well but still notes occasional pain for which she is taking Tylenol .  She has finished her baby aspirin  for which she was taking for DVT prophylaxis.  She has been in physical therapy making good progress.  Examination of the left knee reveals a well healed surgical incision without complication.  Range of motion 0 to 120 degrees.  She is neurovascularly intact distally.  Today, we have discussed continue with physical therapy until she is ready to discharge to slowly a home exercise program.  Dental prophylaxis reinforced.  Follow-up in 6 weeks for recheck.  Call with concerns or questions.  Follow-Up Instructions: Return in about 6 weeks (around 09/08/2023).   Orders:  Orders Placed This Encounter  Procedures   XR Knee 1-2 Views Left   Meds ordered this encounter  Medications   diclofenac  (VOLTAREN ) 75 MG EC tablet    Sig: Take 1 tablet (75 mg total) by mouth 2 (two) times daily as needed.    Dispense:  60 tablet    Refill:  2    Imaging: XR Knee 1-2 Views Left Result Date: 07/28/2023 Well-seated prosthesis without complication   PMFS History: Patient Active Problem List   Diagnosis Date Noted   Status post total left knee replacement 06/13/2023   Primary osteoarthritis of right knee 09/01/2021   Primary osteoarthritis of left knee 09/01/2021   Ingrown toenail 12/21/2019   Left hip pain 09/04/2017   Right shoulder pain 09/04/2017   Arthralgia of right  temporomandibular joint 11/16/2016   Sensorineural hearing loss (SNHL) of both ears 11/16/2016   Tinnitus of left ear 11/16/2016   Synovial cyst 08/07/2015   Trochanteric bursitis of both hips 10/22/2014   Plantar fasciitis, left 12/24/2013   Left knee pain 10/15/2011   Past Medical History:  Diagnosis Date   Arthritis    Atypical nevus 05/23/2000   Left Upper Arm-Mild   Atypical nevus 05/25/2006   Right Calf-Moderate   Atypical nevus 01/23/2010   Left Upper Inner Arm-Mild   Atypical nevus 08/06/2015   Left Forearm-Mild   BCC (basal cell carcinoma of skin) 03/31/2004   Right Lower Back (tx p bx)   GERD (gastroesophageal reflux disease)    History of kidney stones 2010   Migraine    PONV (postoperative nausea and vomiting)    Superficial basal cell carcinoma (BCC) 10/15/1999   Lower Post Neck, Left Mid Back,Left Lower Back and Upper Sternum (all Cx3,5FU)   Superficial basal cell carcinoma (BCC) 03/01/2014   Left Post Shoulder (Cx3,5FU)   Superficial basal cell carcinoma (BCC) 06/26/2014   Right Shoulder (Cx3,5FU)   Superficial basal cell carcinoma (BCC) 06/28/2017   Sup Upper Sternum (tx p bx)    Family History  Problem Relation Age of Onset   Heart attack Mother    Hyperlipidemia Mother    Diabetes Mother  Heart attack Father    Hyperlipidemia Father    Hypertension Father    Sudden death Neg Hx     Past Surgical History:  Procedure Laterality Date   ABDOMINAL HYSTERECTOMY     ANTERIOR CRUCIATE LIGAMENT REPAIR     left   BREAST SURGERY     CHOLECYSTECTOMY     TOTAL KNEE ARTHROPLASTY Left 06/13/2023   Procedure: LEFT TOTAL KNEE ARTHROPLASTY;  Surgeon: Jerri Kay HERO, MD;  Location: [facility] OR;  Service: Orthopedics;  Laterality: Left;   Social History   Occupational History   Not on file  Tobacco Use   Smoking status: Never   Smokeless tobacco: Never  Vaping Use   Vaping status: Never Used  Substance and Sexual Activity   Alcohol use: No    Alcohol/week:  0.0 standard drinks of alcohol   Drug use: No   Sexual activity: Not on file

## 2023-07-29 ENCOUNTER — Encounter: Payer: Self-pay | Admitting: Physical Therapy

## 2023-07-29 ENCOUNTER — Other Ambulatory Visit (HOSPITAL_BASED_OUTPATIENT_CLINIC_OR_DEPARTMENT_OTHER): Payer: Self-pay

## 2023-07-29 ENCOUNTER — Ambulatory Visit: Payer: Medicare HMO | Admitting: Physical Therapy

## 2023-07-29 DIAGNOSIS — M6281 Muscle weakness (generalized): Secondary | ICD-10-CM

## 2023-07-29 DIAGNOSIS — M25662 Stiffness of left knee, not elsewhere classified: Secondary | ICD-10-CM

## 2023-07-29 DIAGNOSIS — R600 Localized edema: Secondary | ICD-10-CM

## 2023-07-29 DIAGNOSIS — M25562 Pain in left knee: Secondary | ICD-10-CM

## 2023-07-29 DIAGNOSIS — R2689 Other abnormalities of gait and mobility: Secondary | ICD-10-CM

## 2023-07-29 NOTE — Therapy (Signed)
 OUTPATIENT PHYSICAL THERAPY TREATMENT  Progress Note  Reporting Period 06/29/2023 to 07/29/2023   See note below for Objective Data and Assessment of Progress/Goals.    Patient Name: Kendra Le MRN: [id number] DOB:Jul 09, 1955, 68 y.o., female Today's Date: 07/29/2023   END OF SESSION:  PT End of Session - 07/29/23 1018     Visit Number 10    Date for PT Re-Evaluation 08/10/23    Authorization Type [employer]    Progress Note Due on Visit 20    PT Start Time 1018    PT Stop Time 1105    PT Time Calculation (min) 47 min    Activity Tolerance Patient tolerated treatment well    Behavior During Therapy [facility] for tasks assessed/performed                    Past Medical History:  Diagnosis Date   Arthritis    Atypical nevus 05/23/2000   Left Upper Arm-Mild   Atypical nevus 05/25/2006   Right Calf-Moderate   Atypical nevus 01/23/2010   Left Upper Inner Arm-Mild   Atypical nevus 08/06/2015   Left Forearm-Mild   BCC (basal cell carcinoma of skin) 03/31/2004   Right Lower Back (tx p bx)   GERD (gastroesophageal reflux disease)    History of kidney stones 2010   Migraine    PONV (postoperative nausea and vomiting)    Superficial basal cell carcinoma (BCC) 10/15/1999   Lower Post Neck, Left Mid Back,Left Lower Back and Upper Sternum (all Cx3,5FU)   Superficial basal cell carcinoma (BCC) 03/01/2014   Left Post Shoulder (Cx3,5FU)   Superficial basal cell carcinoma (BCC) 06/26/2014   Right Shoulder (Cx3,5FU)   Superficial basal cell carcinoma (BCC) 06/28/2017   Sup Upper Sternum (tx p bx)   Past Surgical History:  Procedure Laterality Date   ABDOMINAL HYSTERECTOMY     ANTERIOR CRUCIATE LIGAMENT REPAIR     left   BREAST SURGERY     CHOLECYSTECTOMY     TOTAL KNEE ARTHROPLASTY Left 06/13/2023   Procedure: LEFT TOTAL KNEE ARTHROPLASTY;  Surgeon: Jerri Kay HERO, MD;  Location: [facility] OR;  Service: Orthopedics;  Laterality: Left;   Patient Active Problem List    Diagnosis Date Noted   Status post total left knee replacement 06/13/2023   Primary osteoarthritis of right knee 09/01/2021   Primary osteoarthritis of left knee 09/01/2021   Ingrown toenail 12/21/2019   Left hip pain 09/04/2017   Right shoulder pain 09/04/2017   Arthralgia of right temporomandibular joint 11/16/2016   Sensorineural hearing loss (SNHL) of both ears 11/16/2016   Tinnitus of left ear 11/16/2016   Synovial cyst 08/07/2015   Trochanteric bursitis of both hips 10/22/2014   Plantar fasciitis, left 12/24/2013   Left knee pain 10/15/2011    PCP: Arloa Elsie SAUNDERS, MD   REFERRING PROVIDER: Jerri Kay HERO, MD   REFERRING DIAG: [phone] (ICD-10-CM) - Primary osteoarthritis of left knee   THERAPY DIAG:  Acute pain of left knee  Stiffness of left knee, not elsewhere classified  Muscle weakness (generalized)  Other abnormalities of gait and mobility  Localized edema  RATIONALE FOR EVALUATION AND TREATMENT: Rehabilitation  ONSET DATE: 06/13/23 - L TKA  NEXT MD VISIT: 07/28/23   SUBJECTIVE:  SUBJECTIVE STATEMENT: Pt reports PA was pleased with her progress and left it up to PT on how much more she needed to work with PT.  PA offered her a hip injection if she wants it to help with the ITB issues.  EVAL:  S/p L TKA on 06/13/23. HH PT completed 06/21/23.  Uses CPM for ~1 hr at time but limited tolerance due to RLS - CPM due to be returned on Monday.  Pt reports she has been moving around well at home and weaned herself to the [facility] as of yesterday.  Pain not too bad during the day but still interferes with sleep.  She notes a popping feeling which the MD told her might be from her patella or her ITB.  Still competing HH PT HEP 2x/day.  PAIN: Are you having pain? Yes: NPRS scale:  3/10 Pain location: L knee  Pain description: ache  Aggravating factors: end ROM knee flexion Relieving factors: Tylenol  during day, Tylenol  #3 and muscle relaxant at night, ice  PERTINENT HISTORY:  OA - B knees s/p L TKA 06/13/23; B hearing loss; basal cell carcinoma of skin - multiple locations  PRECAUTIONS: None  RED FLAGS: None  WEIGHT BEARING RESTRICTIONS: No  FALLS:  Has patient fallen in last 6 months? No  LIVING ENVIRONMENT: Lives with: lives with their spouse Lives in: House/apartment Stairs: Yes: External: 2 steps; none Has following equipment at home: Single point cane, Walker - 2 wheeled, and bed side commode  OCCUPATION: Retired  PLOF: Independent and Leisure: walking daily 30-45 min, reading  PATIENT GOALS: Be able to walk normally again and not hurt my other knee.   OBJECTIVE: (objective measures completed at initial evaluation unless otherwise dated)  DIAGNOSTIC FINDINGS:  06/13/23 - DG Left knee: Left knee arthroplasty without immediate postoperative complication.  03/22/23 - XR Left knee: X-rays of the left knee show advanced tricompartmental degenerative joint disease worse in the patellofemoral compartment.   PATIENT SURVEYS:  LEFS 40 / 80 = 50.0 %  COGNITION: Overall cognitive status: Within functional limits for tasks assessed    SENSATION: WFL  EDEMA:  Mild/mod edema in L knee and lower leg  PALPATION: Mildly reduced L patellar mobility, predominantly superiorly/inferiorly  MUSCLE LENGTH:  (07/01/23) Hamstrings: mild tight L ITB: mild tight L Piriformis: mild tight L Hip flexors: WFL Quads: mod tight L Heelcord: mild tight L  LOWER EXTREMITY ROM:  Active ROM Right eval Left eval L 07/06/23 L 07/15/23 L 07/21/23 L  07/29/23  Knee flexion 135 105 114 117 120  supine 124  Knee extension 0 -13 -8 -4 0 supported in supine -4 seated LAQ      0 quad set supine   Passive ROM Left eval L 07/06/23 L 07/15/23  Knee flexion 109  121  Knee  extension -4 -2 0  (Blank rows = not tested)  LOWER EXTREMITY MMT:  MMT Right eval Left eval R 07/19/23 L 07/19/23  Hip flexion 5 4- 5 4+  Hip extension 4+ 4 5 4+  Hip abduction 5 4 4+ 4  Hip adduction 5 4- 5 4+  Hip internal rotation 5 4 5  4+  Hip external rotation 4+ 3+ 4+ 4-  Knee flexion 5 4 5  4+  Knee extension 5 4- 4+ 4+  Ankle dorsiflexion 5 5 5 5   Ankle plantarflexion      Ankle inversion      Ankle eversion       (Blank rows = not tested)  FUNCTIONAL TESTS:  5 times sit to stand: 16.88 sec Timed up and go (TUG): 13.85 sec with SPC Dynamic Gait Index: 19/24  GAIT: Distance walked: clinic distances Assistive device utilized: Single point cane Level of assistance: Modified independence Gait pattern: decreased stance time- Left, decreased stride length, decreased hip/knee flexion- Left, and lateral lean- Right   TODAY'S TREATMENT:   07/29/23  THERAPEUTIC EXERCISE: To improve strength, endurance, and flexibility.  Demonstration, verbal and tactile cues throughout for technique.  Rec Bike - L3-4 x 6 minutes Seated L KTOS piriformis stretch x 30 Seated figure-4 hip hinge piriformis stretch x 30 - more uncomfortable for L knee Side-sitting figure-4 hip hinge piriformis stretch x 30 - still uncomfortable for L knee Supine cross-body ITB stretch with strap 2 x 30 Hooklying figure-4 piriformis stretch with slight overpressure 2 x 30 S/L L hip abduction + slight extension x 10 S/L L hip abduction arc over opposite LE x 10 S/L L hip abduction + hip flexion/extension x 10 S/L L hip abduction diagonal x 10  MANUAL THERAPY: To promote normalized muscle tension, improved flexibility, improved joint mobility, and reduced pain. Trigger Point Dry Needling: Treatment instructions/education: Subsequent Treatment: Instructions provided previously at initial dry needling treatment.  Education Handout Provided: Previously Provided Consent: Patient Verbal Consent Given:  Yes Treatment: Muscles Treated: L glutes medius and minimus, L piriformis Skilled palpation and monitoring of soft tissue during DN Electrical Stimulation Performed: No Treatment Response/Outcome: Twitch Response Elicited, Palpable Increase in Muscle Length, Decreased TTP, and Improved Exercise Tolerance STM/DTM, manual TPR and pin & stretch to muscles addressed with DN  PHYSICAL PERFORMANCE TEST or MEASUREMENT:  Dynamic Gait Index   Level Surface Normal    Change in Gait Speed Normal    Gait with Horizontal Head Turns Normal    Gait with Vertical Head Turns Normal    Gait and Pivot Turn Normal    Step Over Obstacle Normal    Step Around Obstacles Normal    Steps Mild Impairment    Total Score 23/24   DGI comment: Scores of 19 or less are predictive of falls in older community living adults       07/26/23 THERAPEUTIC EXERCISE: to improve flexibility, strength and mobility.  Demonstration, verbal and tactile cues throughout for technique.  Rec Bike - L4 x 6 minutes Seated L LE blue TB leg press 2 x 10 BATCA knee flexion B con/ecc 25# x 10, B con/L ecc 20# 2 x 10 BATCA knee extension B con/ecc 20# x 10, B con/L ecc 15# 2 x 10  GAIT TRAINING: To normalize gait pattern and improve safety with stair negotiation . Stairs: Level of Assistance: Complete Independence and Modified independence Stair Negotiation Technique: Alternating Pattern Forwards with intermittent Single Rail on Right Number of Stairs: 14  Height of Stairs: 7  Comments: Increased effort required to raise body weight on L LE during ascent and decreased L eccentric quad control evident on descent  THERAPEUTIC ACTIVITIES: To improve functional performance.  Demonstration, verbal and tactile cues throughout for technique. Lateral L eccentric step-touch downs from 6 step x 10 Fwd L eccentric step-touch downs from 6 step x 10  NEUROMUSCULAR RE-EDUCATION: To improve balance, coordination, kinesthetic sense,  proprioception, and amplitude of movement. L SLS + 5-way toe tap to colored dots 2 x 5 sets, 2nd set with L blue TB TKE and tactile cues from PT at hips to avoid fwd translation of body SLS hip ABD isometric into ball on wall 10  x 3 bil Standing hip ABD/ER isometric into ball on wall 10 x 3 bil L fwd step-up/down to 6 step + blue TB TKE 2 x 10 Wall squat + GTB hip ABD isometric 2 x 10  MODALITIES:  Game Ready vasopneumatic compression post session to L knee x 10 min, high compression, 34 to reduce post-exercise pain and swelling/edema   07/21/23 THERAPEUTIC EXERCISE: to improve flexibility, strength and mobility.  Demonstration, verbal and tactile cues throughout for technique. Recumebtn Bike L3x17min L fwd step-up to 6 step + blue TB TKE 2 x 10 L lateral step-up to 6 step + blue TB TKE 2 x 10 L forward eccentric lowering from 4 with light toe touch to floor 2 x 10  Standing hip abduction x 10 BLE GTB Standing hip extension x 10 BLE GTB Squats GTB 2 x 10  Supine ITB stretch strap 2x30 Supine KTOS and figure 4 stretch 2x30  MANUAL THERAPY: To promote normalized muscle tension, improved flexibility, improved joint mobility, increased ROM, and reduced pain. IASTM s/s tool to ITB, VL   07/19/23 THERAPEUTIC EXERCISE: to improve flexibility, strength and mobility.  Demonstration, verbal and tactile cues throughout for technique.  Rec Bike - L3 x 6 minutes L fwd step-up to 6 step + blue TB TKE x 10 L lateral step-up to 6 step + blue TB TKEx 10 L lateral eccentric lowering from 6 with light toe touch to floor x 10  L forward eccentric lowering from 6 with light toe touch to floor x 10  B side-stepping with looped GTB at ankles 2 x 15 ft Fwd/back monster walk with looped GTB at ankles 2 x 15 ft Bridge + GTB hip ABD/ER x 10 Sustained bridge + GTB hip ABD/ER x 10 Sustained bridge + GTB hip flexion march x 10 L single leg bridge 2 x 10  THERAPEUTIC ACTIVITIES:  LE  MMT  MANUAL THERAPY: To promote normalized muscle tension, improved flexibility, improved joint mobility, increased ROM, and reduced pain. L patellar mobs - all directions L incisional scar massage avoiding 2 remaining areas of slight scabbing as well as protruding stitch STM/DTM, manual TPR and pin & stretch to L mid/distal ITB  MODALITIES:  Game Ready vasopneumatic compression post session to L knee x 10 min, high compression, 34 to reduce post-exercise pain and swelling/edema   PATIENT EDUCATION:  Education details: progress with PT, ongoing PT POC, and continue with current HEP  Person educated: Patient Education method: Explanation Education comprehension: verbalized understanding  HOME EXERCISE PROGRAM: Access Code: [email or web address] URL: [email or web address] Date: 07/29/2023 Prepared by: Elijah Hidden  Exercises - Mini Squat with Counter Support  - 1 x daily - 7 x weekly - 2 sets - 10 reps - 3-5 sec hold - Supine Hamstring Stretch with Strap (Mirrored)  - 2-3 x daily - 7 x weekly - 3 reps - 30 sec hold - Supine Iliotibial Band Stretch with Strap (Mirrored)  - 2-3 x daily - 7 x weekly - 3 reps - 30 sec hold - Prone Quad Stretch with Towel Roll and Strap  - 2-3 x daily - 7 x weekly - 3 reps - 30 sec hold - Prone Knee Extension Hang  - 2 x daily - 7 x weekly - 3-5 min hold - Seated Leg Press with Resistance  - 1 x daily - 3 x weekly - 2 sets - 10 reps - 3 sec hold - Standing Terminal Knee Extension with Resistance  - 1 x daily - 3  x weekly - 2 sets - 10 reps - 5 sec hold - Standing ITB Stretch  - 2-3 x daily - 7 x weekly - 3 reps - 30 sec hold - Clam with Resistance (Mirrored)  - 1 x daily - 3 x weekly - 2 sets - 10 reps - 3-5 sec hold - Standing Hip Flexion with Anchored Resistance and Chair Support  - 1 x daily - 3 x weekly - 2 sets - 10 reps - 3 sec hold - Standing Hip Adduction with Anchored Resistance  - 1 x daily - 3 x weekly - 2 sets - 10 reps - 3 sec hold -  Standing Hip Extension with Anchored Resistance  - 1 x daily - 3 x weekly - 2 sets - 10 reps - 3 sec hold - Standing Hip Abduction with Anchored Resistance  - 1 x daily - 3 x weekly - 2 sets - 10 reps - 3 sec hold - Seated Hamstring Curl with Anchored Resistance  - 1 x daily - 3 x weekly - 2 sets - 10 reps - 3 sec hold - Sitting Knee Extension with Resistance  - 1 x daily - 3 x weekly - 2 sets - 10 reps - 3-5 sec hold - Sidelying Diagonal Hip Abduction  - 1 x daily - 3 x weekly - 2 sets - 10 reps - 3 sec hold  Patient Education - Kinesiology tape - Scar Massage   ASSESSMENT:  CLINICAL IMPRESSION: Geanette has demonstrated good progress with PT s/p L TKA on 06/13/2023.  Her L knee AROM has progressed to -4-124 with 0 supported extension available.  Her overall BLE strength is improving, however continued mild weakness noted on L vs R especially in L hip abduction and ER as well as eccentric quad control which contributes to ongoing L ITB issues as well as mild lateral instability at hips while ambulating and difficulty with descending stairs.  Today's treatment interventions focusing on further normalizing muscle tension in L glutes and piriformis as well as strengthening for L lateral hip musculature to address her ongoing deficits.  DGI score has improved to 23/24 no longer indicating any increased fall risk.  Perceived function per LEFS has improved to 72.5% from 50% on eval.  Shalita is progressing well towards her PT goals with all STG's now met as well as LTG's #2, 3, 7 and 8 also met, and remaining LTG's at least partially met.  Callie will benefit from continued skilled PT to address ongoing abnormal muscle tension, ROM, strength and balance deficits to improve mobility and activity tolerance with decreased pain interference.  She has just shy of 2 weeks remaining in her current POC and will hopefully be ready to transition to her HEP at the end of the current certification period.  OBJECTIVE  IMPAIRMENTS: Abnormal gait, decreased activity tolerance, decreased balance, decreased endurance, decreased knowledge of condition, decreased mobility, difficulty walking, decreased ROM, decreased strength, increased edema, increased fascial restrictions, impaired perceived functional ability, increased muscle spasms, impaired flexibility, improper body mechanics, postural dysfunction, and pain.   ACTIVITY LIMITATIONS: carrying, lifting, bending, sitting, standing, squatting, sleeping, stairs, transfers, bed mobility, bathing, and locomotion level  PARTICIPATION LIMITATIONS: meal prep, cleaning, laundry, driving, shopping, and community activity  PERSONAL FACTORS: Past/current experiences, Time since onset of injury/illness/exacerbation, and 1-2 comorbidities: OA - B knees; B hearing loss; basal cell carcinoma of skin - multiple locations  are also affecting patient's functional outcome.   REHAB POTENTIAL: Excellent  CLINICAL DECISION MAKING:  Stable/uncomplicated  EVALUATION COMPLEXITY: Low   GOALS: Goals reviewed with patient? Yes  SHORT TERM GOALS: Target date: 07/20/2023  Patient will be independent with initial HEP. Baseline: Performing HH PT HEP 2x/day - reviewed and modified on eval Goal status: MET - 07/06/23  2.  Patient will report at least 25% improvement in L knee pain to improve QOL. Baseline: 5/10 currently, up to 8-9/10 at worst (typically at night) Goal status: MET - 07/19/23 - 50% improvement  3.  Patient will demonstrate improved L knee AROM to >/= -5-115 to improve gait and stair mechanics. Baseline: L knee AROM -13-105; PROM -4-109 Goal status: MET - 07/15/23 - L knee AROM -4-117 with supported extension down to 0  LONG TERM GOALS: Target date: 08/10/2023  Patient will be independent with advanced/ongoing HEP to improve outcomes and carryover.  Baseline:  Goal status: PARTIALLY MET - 07/29/23 - Met for current HEP  2.  Patient will report at least 50-75%  improvement in L knee pain to improve QOL. Baseline: 5/10 currently, up to 8-9/10 at worst (typically at night) Goal status: MET - 07/29/23 - 75% improvement  3.  Patient will demonstrate improved L knee AROM to >/= -2-120 to allow for normal gait and stair mechanics. Baseline: L knee AROM -13-105; PROM -4-109 Goal status: MET - 07/29/23 - L knee AROM -4-124 supine with 0 supported extension  4.  Patient will demonstrate improved L LE strength to >/= 4+/5 for improved stability and ease of mobility. Baseline: Refer to above LE MMT table Goal status: PARTIALLY MET - 07/19/23 - strength improving but L hip remains weaker in most muscle groups, with L hip abduction 4/5 and ER 4-/5  5.  Patient will be able to ambulate 600' w/o AD and normal gait pattern without increased pain to access community.  Baseline: Currently ambulating with SPC and mildly antalgic gait pattern Goal status: PARTIALLY MET - 07/29/23 - good L hip and knee flexion with heel toe progression but increased lateral sway/hip instability  6. Patient will be able to ascend/descend stairs with 1 HR and reciprocal step pattern safely to access home and community.  Baseline: Mostly step to pattern due to limited L knee ROM and quad control Goal status: IN PROGRESS - 07/12/23 - able to navigate stairs reciprocally with single HR however limited eccentric quad control noted on descent  7.  Patient will report >/= 50/80 on LEFS to demonstrate improved functional ability. Baseline: 40 / 80 = 50.0 % Goal status: MET - 07/29/23 - 58 / 80 = 72.5 %  8.  Patient will demonstrate at least 22/24 on DGI to decrease risk of falls. Baseline: 19/24 Goal status: MET - 07/29/23 - 23/24   PLAN:  PT FREQUENCY: 2-3x/week  PT DURATION: 6 weeks  PLANNED INTERVENTIONS: 97164- PT Re-evaluation, 97110-Therapeutic exercises, 97530- Therapeutic activity, 97112- Neuromuscular re-education, 97535- Self Care, [id number]- Manual therapy, 97116- Gait training,  97014- Electrical stimulation (unattended), [phone]- Electrical stimulation (manual), 97016- Vasopneumatic device, 97035- Ultrasound, Patient/Family education, Balance training, Stair training, Taping, Dry Needling, Joint mobilization, Scar mobilization, DME instructions, Cryotherapy, and Moist heat  PLAN FOR NEXT SESSION: progress eccentric quad and proximal LE strengthening with emphasis on L hip abduction and ER; continue to monitor L knee ROM with weekly ROM reassessment and address as indicated; MT +/- TPDN as indicated to address abnormal muscle tension and improve L knee ROM; reapplication of Kinesiotape as indicated and benefit noted; modalities including vasopneumatic compression as indicated for pain and edema management  Elijah CHRISTELLA Hidden, PT 07/29/2023, 1:35 PM

## 2023-08-02 ENCOUNTER — Encounter: Payer: Self-pay | Admitting: Physical Therapy

## 2023-08-02 ENCOUNTER — Ambulatory Visit: Payer: Medicare HMO | Admitting: Physical Therapy

## 2023-08-02 DIAGNOSIS — M25662 Stiffness of left knee, not elsewhere classified: Secondary | ICD-10-CM

## 2023-08-02 DIAGNOSIS — R2689 Other abnormalities of gait and mobility: Secondary | ICD-10-CM

## 2023-08-02 DIAGNOSIS — M25562 Pain in left knee: Secondary | ICD-10-CM

## 2023-08-02 DIAGNOSIS — M6281 Muscle weakness (generalized): Secondary | ICD-10-CM

## 2023-08-02 DIAGNOSIS — R600 Localized edema: Secondary | ICD-10-CM

## 2023-08-02 NOTE — Therapy (Signed)
OUTPATIENT PHYSICAL THERAPY TREATMENT    Patient Name: SYDNEE LAMOUR MRN: [id number] DOB:09/27/1955, 68 y.o., female Today's Date: 08/02/2023   END OF SESSION:  PT End of Session - 08/02/23 1016     Visit Number 11    Date for PT Re-Evaluation 08/10/23    Authorization Type [employer]    Progress Note Due on Visit 20    PT Start Time 1016    PT Stop Time 1111    PT Time Calculation (min) 55 min    Activity Tolerance Patient tolerated treatment well    Behavior During Therapy [facility] for tasks assessed/performed                     Past Medical History:  Diagnosis Date   Arthritis    Atypical nevus 05/23/2000   Left Upper Arm-Mild   Atypical nevus 05/25/2006   Right Calf-Moderate   Atypical nevus 01/23/2010   Left Upper Inner Arm-Mild   Atypical nevus 08/06/2015   Left Forearm-Mild   BCC (basal cell carcinoma of skin) 03/31/2004   Right Lower Back (tx p bx)   GERD (gastroesophageal reflux disease)    History of kidney stones 2010   Migraine    PONV (postoperative nausea and vomiting)    Superficial basal cell carcinoma (BCC) 10/15/1999   Lower Post Neck, Left Mid Back,Left Lower Back and Upper Sternum (all Cx3,5FU)   Superficial basal cell carcinoma (BCC) 03/01/2014   Left Post Shoulder (Cx3,5FU)   Superficial basal cell carcinoma (BCC) 06/26/2014   Right Shoulder (Cx3,5FU)   Superficial basal cell carcinoma (BCC) 06/28/2017   Sup Upper Sternum (tx p bx)   Past Surgical History:  Procedure Laterality Date   ABDOMINAL HYSTERECTOMY     ANTERIOR CRUCIATE LIGAMENT REPAIR     left   BREAST SURGERY     CHOLECYSTECTOMY     TOTAL KNEE ARTHROPLASTY Left 06/13/2023   Procedure: LEFT TOTAL KNEE ARTHROPLASTY;  Surgeon: Tarry Kos, MD;  Location: [facility] OR;  Service: Orthopedics;  Laterality: Left;   Patient Active Problem List   Diagnosis Date Noted   Status post total left knee replacement 06/13/2023   Primary osteoarthritis of right knee 09/01/2021    Primary osteoarthritis of left knee 09/01/2021   Ingrown toenail 12/21/2019   Left hip pain 09/04/2017   Right shoulder pain 09/04/2017   Arthralgia of right temporomandibular joint 11/16/2016   Sensorineural hearing loss (SNHL) of both ears 11/16/2016   Tinnitus of left ear 11/16/2016   Synovial cyst 08/07/2015   Trochanteric bursitis of both hips 10/22/2014   Plantar fasciitis, left 12/24/2013   Left knee pain 10/15/2011    PCP: Noberto Retort, MD   REFERRING PROVIDER: Tarry Kos, MD   REFERRING DIAG: [phone] (ICD-10-CM) - Primary osteoarthritis of left knee   THERAPY DIAG:  Acute pain of left knee  Stiffness of left knee, not elsewhere classified  Muscle weakness (generalized)  Other abnormalities of gait and mobility  Localized edema  RATIONALE FOR EVALUATION AND TREATMENT: Rehabilitation  ONSET DATE: 06/13/23 - L TKA  NEXT MD VISIT: 07/28/23   SUBJECTIVE:  SUBJECTIVE STATEMENT: Pt reports her pain has been worse since she had to stop taking the Voltaren.Marland Kitchen  EVAL:  S/p L TKA on 06/13/23. HH PT completed 06/21/23.  Uses CPM for ~1 hr at time but limited tolerance due to RLS - CPM due to be returned on Monday.  Pt reports she has been moving around well at home and weaned herself to the [facility] as of yesterday.  Pain not too bad during the day but still interferes with sleep.  She notes a popping feeling which the MD told her might be from her patella or her ITB.  Still competing HH PT HEP 2x/day.  PAIN: Are you having pain? Yes: NPRS scale: 5/10 Pain location: L knee  Pain description: ache  Aggravating factors: end ROM knee flexion Relieving factors: Tylenol during day, Tylenol #3 and muscle relaxant at night, ice  PERTINENT HISTORY:  OA - B knees s/p L TKA 06/13/23;  B hearing loss; basal cell carcinoma of skin - multiple locations  PRECAUTIONS: None  RED FLAGS: None  WEIGHT BEARING RESTRICTIONS: No  FALLS:  Has patient fallen in last 6 months? No  LIVING ENVIRONMENT: Lives with: lives with their spouse Lives in: House/apartment Stairs: Yes: External: 2 steps; none Has following equipment at home: Single point cane, Walker - 2 wheeled, and bed side commode  OCCUPATION: Retired  PLOF: Independent and Leisure: walking daily 30-45 min, reading  PATIENT GOALS: "Be able to walk normally again and not hurt my other knee."   OBJECTIVE: (objective measures completed at initial evaluation unless otherwise dated)  DIAGNOSTIC FINDINGS:  06/13/23 - DG Left knee: Left knee arthroplasty without immediate postoperative complication.  03/22/23 - XR Left knee: X-rays of the left knee show advanced tricompartmental degenerative joint disease worse in the patellofemoral compartment.   PATIENT SURVEYS:  LEFS 40 / 80 = 50.0 %  COGNITION: Overall cognitive status: Within functional limits for tasks assessed    SENSATION: WFL  EDEMA:  Mild/mod edema in L knee and lower leg  PALPATION: Mildly reduced L patellar mobility, predominantly superiorly/inferiorly  MUSCLE LENGTH:  (07/01/23) Hamstrings: mild tight L ITB: mild tight L Piriformis: mild tight L Hip flexors: WFL Quads: mod tight L Heelcord: mild tight L  LOWER EXTREMITY ROM:  Active ROM Right eval Left eval L 07/06/23 L 07/15/23 L 07/21/23 L  07/29/23  Knee flexion 135 105 114 117 120  supine 124  Knee extension 0 -13 -8 -4 0 supported in supine -4 seated LAQ      0 quad set supine   Passive ROM Left eval L 07/06/23 L 07/15/23  Knee flexion 109  121  Knee extension -4 -2 0  (Blank rows = not tested)  LOWER EXTREMITY MMT:  MMT Right eval Left eval R 07/19/23 L 07/19/23  Hip flexion 5 4- 5 4+  Hip extension 4+ 4 5 4+  Hip abduction 5 4 4+ 4  Hip adduction 5 4- 5 4+  Hip internal rotation  5 4 5  4+  Hip external rotation 4+ 3+ 4+ 4-  Knee flexion 5 4 5  4+  Knee extension 5 4- 4+ 4+  Ankle dorsiflexion 5 5 5 5   Ankle plantarflexion      Ankle inversion      Ankle eversion       (Blank rows = not tested)  FUNCTIONAL TESTS:  5 times sit to stand: 16.88 sec Timed up and go (TUG): 13.85 sec with [facility] Dynamic Gait Index: 19/24  07/29/23: DGI:  23/24  GAIT: Distance walked: clinic distances Assistive device utilized: Single point cane Level of assistance: Modified independence Gait pattern: decreased stance time- Left, decreased stride length, decreased hip/knee flexion- Left, and lateral lean- Right   TODAY'S TREATMENT:   08/02/23 THERAPEUTIC EXERCISE: To improve strength, endurance, ROM, and flexibility.  Demonstration, verbal and tactile cues throughout for technique.  Rec Bike - L4 x 6 min Standing gastroc stretch at wall 2 x 60" L black TB TKE 2 x 10 - cues to only bend/straighten knee, avoiding fwd or backward movement at pelvis BATCA leg press B 25# 2 x 10, L LE 10# 2 x 10 BATCA knee flexion B con/ecc 25# 2 x 10, B con/L ecc 20# 2 x 10 BATCA knee extension B con/ecc 20# x 10, B con/L ecc 15# 2 x 10  MANUAL THERAPY: To promote improved joint mobility and increased ROM utilizing joint mobilization.  L knee femorotibial AP mobs + femoral IR in standing gastroc stretch position to promote increased L knee extension  NEUROMUSCULAR RE-EDUCATION: To improve balance, coordination, kinesthetic sense, proprioception, and reduce fall risk. Fitter hip ABD (1 black/1 blue) 2 x 10, UE support on back of chair for balance Fitter hip extension (1 black/1 blue) 2 x 10, UE support on back of chair for balance L fwd step-up/down to 6" step + black TB TKE 2 x 10, intermittent UE support on back of chair for balance L fwd step-up/down to 8" step + black TB TKE 2 x 10, intermittent UE support on back of chair for balance  MODALITIES: Game Ready vasopneumatic compression post session  to L knee x 10 min, high compression, 34 to reduce post-exercise pain and swelling/edema   07/29/23  THERAPEUTIC EXERCISE: To improve strength, endurance, and flexibility.  Demonstration, verbal and tactile cues throughout for technique.  Rec Bike - L3-4 x 6 minutes Seated L KTOS piriformis stretch x 30" Seated figure-4 hip hinge piriformis stretch x 30" - more uncomfortable for L knee Side-sitting figure-4 hip hinge piriformis stretch x 30" - still uncomfortable for L knee Supine cross-body ITB stretch with strap 2 x 30" Hooklying figure-4 piriformis stretch with slight overpressure 2 x 30" S/L L hip abduction + slight extension x 10 S/L L hip abduction arc over opposite LE x 10 S/L L hip abduction + hip flexion/extension x 10 S/L L hip abduction diagonal x 10  MANUAL THERAPY: To promote normalized muscle tension, improved flexibility, improved joint mobility, and reduced pain. Trigger Point Dry Needling: Treatment instructions/education: Subsequent Treatment: Instructions provided previously at initial dry needling treatment.  Education Handout Provided: Previously Provided Consent: Patient Verbal Consent Given: Yes Treatment: Muscles Treated: L glutes medius and minimus, L piriformis Skilled palpation and monitoring of soft tissue during DN Electrical Stimulation Performed: No Treatment Response/Outcome: Twitch Response Elicited, Palpable Increase in Muscle Length, Decreased TTP, and Improved Exercise Tolerance STM/DTM, manual TPR and pin & stretch to muscles addressed with DN  PHYSICAL PERFORMANCE TEST or MEASUREMENT:  Dynamic Gait Index   Level Surface Normal    Change in Gait Speed Normal    Gait with Horizontal Head Turns Normal    Gait with Vertical Head Turns Normal    Gait and Pivot Turn Normal    Step Over Obstacle Normal    Step Around Obstacles Normal    Steps Mild Impairment    Total Score 23/24   DGI comment: Scores of 19 or less are predictive of falls in older  community living adults  07/26/23 THERAPEUTIC EXERCISE: to improve flexibility, strength and mobility.  Demonstration, verbal and tactile cues throughout for technique.  Rec Bike - L4 x 6 minutes Seated L LE blue TB leg press 2 x 10 BATCA knee flexion B con/ecc 25# x 10, B con/L ecc 20# 2 x 10 BATCA knee extension B con/ecc 20# 2 x 10, B con/L ecc 15# 2 x 10  GAIT TRAINING: To normalize gait pattern and improve safety with stair negotiation . Stairs: Level of Assistance: Complete Independence and Modified independence Stair Negotiation Technique: Alternating Pattern Forwards with intermittent Single Rail on Right Number of Stairs: 14  Height of Stairs: 7"  Comments: Increased effort required to raise body weight on L LE during ascent and decreased L eccentric quad control evident on descent  THERAPEUTIC ACTIVITIES: To improve functional performance.  Demonstration, verbal and tactile cues throughout for technique. Lateral L eccentric step-touch downs from 6" step x 10 Fwd L eccentric step-touch downs from 6" step x 10  NEUROMUSCULAR RE-EDUCATION: To improve balance, coordination, kinesthetic sense, proprioception, and amplitude of movement. L SLS + 5-way toe tap to colored dots 2 x 5 sets, 2nd set with L blue TB TKE and tactile cues from PT at hips to avoid fwd translation of body SLS hip ABD isometric into ball on wall 10 x 3" bil Standing hip ABD/ER isometric into ball on wall 10 x 3" bil L fwd step-up/down to 6" step + blue TB TKE 2 x 10 Wall squat + GTB hip ABD isometric 2 x 10  MODALITIES:  Game Ready vasopneumatic compression post session to L knee x 10 min, high compression, 34 to reduce post-exercise pain and swelling/edema   PATIENT EDUCATION:  Education details: HEP progression - TKE progressed to black TB and continue with current HEP  Person educated: Patient Education method: Explanation Education comprehension: verbalized understanding  HOME EXERCISE  PROGRAM: Access Code: [email or web address] URL: [email or web address] Date: 07/29/2023 Prepared by: Glenetta Hew  Exercises - Mini Squat with Counter Support  - 1 x daily - 7 x weekly - 2 sets - 10 reps - 3-5 sec hold - Supine Hamstring Stretch with Strap (Mirrored)  - 2-3 x daily - 7 x weekly - 3 reps - 30 sec hold - Supine Iliotibial Band Stretch with Strap (Mirrored)  - 2-3 x daily - 7 x weekly - 3 reps - 30 sec hold - Prone Quad Stretch with Towel Roll and Strap  - 2-3 x daily - 7 x weekly - 3 reps - 30 sec hold - Prone Knee Extension Hang  - 2 x daily - 7 x weekly - 3-5 min hold - Seated Leg Press with Resistance  - 1 x daily - 3 x weekly - 2 sets - 10 reps - 3 sec hold - Standing Terminal Knee Extension with Resistance  - 1 x daily - 3 x weekly - 2 sets - 10 reps - 5 sec hold - Standing ITB Stretch  - 2-3 x daily - 7 x weekly - 3 reps - 30 sec hold - Clam with Resistance (Mirrored)  - 1 x daily - 3 x weekly - 2 sets - 10 reps - 3-5 sec hold - Standing Hip Flexion with Anchored Resistance and Chair Support  - 1 x daily - 3 x weekly - 2 sets - 10 reps - 3 sec hold - Standing Hip Adduction with Anchored Resistance  - 1 x daily - 3 x weekly - 2 sets - 10 reps - 3  sec hold - Standing Hip Extension with Anchored Resistance  - 1 x daily - 3 x weekly - 2 sets - 10 reps - 3 sec hold - Standing Hip Abduction with Anchored Resistance  - 1 x daily - 3 x weekly - 2 sets - 10 reps - 3 sec hold - Seated Hamstring Curl with Anchored Resistance  - 1 x daily - 3 x weekly - 2 sets - 10 reps - 3 sec hold - Sitting Knee Extension with Resistance  - 1 x daily - 3 x weekly - 2 sets - 10 reps - 3-5 sec hold - Sidelying Diagonal Hip Abduction  - 1 x daily - 3 x weekly - 2 sets - 10 reps - 3 sec hold  Patient Education - Kinesiology tape - Scar Massage   ASSESSMENT:  CLINICAL IMPRESSION: Qianna reports increased pain and ongoing swelling having had to stop taking the Voltaren.  She notes continued  feeling that the L knee does note feel like it can get fully as straight as the R knee, despite knee extension AROM to 0. Therapeutic interventions focusing on quad control for knee extension as well as continued hip strengthening to improve stability and reduce ITB irritation.  Progressed TKE to black TB with good tolerance (black band provided for home use) and able to increase step height to 8".  We reviewed gym based weight machines in prep for her to resume gym workouts with no concerns identified.  Session concluded with GR vasopneumatic compression to reduce post-exercise pain and edema.  [place] will benefit from continued skilled PT to address ongoing abnormal muscle tension, ROM, strength and balance deficits to improve mobility and activity tolerance with decreased pain interference, with hopeful transition to her HEP and gym program at the end of the current POC.    OBJECTIVE IMPAIRMENTS: Abnormal gait, decreased activity tolerance, decreased balance, decreased endurance, decreased knowledge of condition, decreased mobility, difficulty walking, decreased ROM, decreased strength, increased edema, increased fascial restrictions, impaired perceived functional ability, increased muscle spasms, impaired flexibility, improper body mechanics, postural dysfunction, and pain.   ACTIVITY LIMITATIONS: carrying, lifting, bending, sitting, standing, squatting, sleeping, stairs, transfers, bed mobility, bathing, and locomotion level  PARTICIPATION LIMITATIONS: meal prep, cleaning, laundry, driving, shopping, and community activity  PERSONAL FACTORS: Past/current experiences, Time since onset of injury/illness/exacerbation, and 1-2 comorbidities: OA - B knees; B hearing loss; basal cell carcinoma of skin - multiple locations  are also affecting patient's functional outcome.   REHAB POTENTIAL: Excellent  CLINICAL DECISION MAKING: Stable/uncomplicated  EVALUATION COMPLEXITY: Low   GOALS: Goals reviewed  with patient? Yes  SHORT TERM GOALS: Target date: 07/20/2023  Patient will be independent with initial HEP. Baseline: Performing HH PT HEP 2x/day - reviewed and modified on eval Goal status: MET - 07/06/23  2.  Patient will report at least 25% improvement in L knee pain to improve QOL. Baseline: 5/10 currently, up to 8-9/10 at worst (typically at night) Goal status: MET - 07/19/23 - 50% improvement  3.  Patient will demonstrate improved L knee AROM to >/= -5-115 to improve gait and stair mechanics. Baseline: L knee AROM -13-105; PROM -4-109 Goal status: MET - 07/15/23 - L knee AROM -4-117 with supported extension down to 0  LONG TERM GOALS: Target date: 08/10/2023  Patient will be independent with advanced/ongoing HEP to improve outcomes and carryover.  Baseline:  Goal status: PARTIALLY MET - 07/29/23 - Met for current HEP  2.  Patient will report at least 50-75% improvement  in L knee pain to improve QOL. Baseline: 5/10 currently, up to 8-9/10 at worst (typically at night) Goal status: MET - 07/29/23 - 75% improvement  3.  Patient will demonstrate improved L knee AROM to >/= -2-120 to allow for normal gait and stair mechanics. Baseline: L knee AROM -13-105; PROM -4-109 Goal status: MET - 07/29/23 - L knee AROM -4-124 supine with 0 supported extension  4.  Patient will demonstrate improved L LE strength to >/= 4+/5 for improved stability and ease of mobility. Baseline: Refer to above LE MMT table Goal status: PARTIALLY MET - 07/19/23 - strength improving but L hip remains weaker in most muscle groups, with L hip abduction 4/5 and ER 4-/5  5.  Patient will be able to ambulate 600' w/o AD and normal gait pattern without increased pain to access community.  Baseline: Currently ambulating with SPC and mildly antalgic gait pattern Goal status: PARTIALLY MET - 07/29/23 - good L hip and knee flexion with heel toe progression but increased lateral sway/hip instability  6. Patient will be  able to ascend/descend stairs with 1 HR and reciprocal step pattern safely to access home and community.  Baseline: Mostly step to pattern due to limited L knee ROM and quad control Goal status: IN PROGRESS - 07/12/23 - able to navigate stairs reciprocally with single HR however limited eccentric quad control noted on descent  7.  Patient will report >/= 50/80 on LEFS to demonstrate improved functional ability. Baseline: 40 / 80 = 50.0 % Goal status: MET - 07/29/23 - 58 / 80 = 72.5 %  8.  Patient will demonstrate at least 22/24 on DGI to decrease risk of falls. Baseline: 19/24 Goal status: MET - 07/29/23 - 23/24   PLAN:  PT FREQUENCY: 2-3x/week  PT DURATION: 6 weeks  PLANNED INTERVENTIONS: 97164- PT Re-evaluation, 97110-Therapeutic exercises, 97530- Therapeutic activity, 97112- Neuromuscular re-education, 97535- Self Care, [id number]- Manual therapy, 97116- Gait training, 97014- Electrical stimulation (unattended), [phone]- Electrical stimulation (manual), 97016- Vasopneumatic device, 97035- Ultrasound, Patient/Family education, Balance training, Stair training, Taping, Dry Needling, Joint mobilization, Scar mobilization, DME instructions, Cryotherapy, and Moist heat  PLAN FOR NEXT SESSION: progress eccentric quad and proximal LE strengthening with emphasis on L hip abduction and ER; continue to monitor L knee ROM with weekly ROM reassessment and address as indicated; MT +/- TPDN as indicated to address abnormal muscle tension and improve L knee ROM; reapplication of Kinesiotape as indicated and benefit noted; modalities including vasopneumatic compression as indicated for pain and edema management    Marry Guan, PT 08/02/2023, 11:06 AM

## 2023-08-05 ENCOUNTER — Ambulatory Visit: Payer: Medicare HMO

## 2023-08-05 DIAGNOSIS — M6281 Muscle weakness (generalized): Secondary | ICD-10-CM

## 2023-08-05 DIAGNOSIS — M25562 Pain in left knee: Secondary | ICD-10-CM | POA: Diagnosis not present

## 2023-08-05 DIAGNOSIS — M25662 Stiffness of left knee, not elsewhere classified: Secondary | ICD-10-CM

## 2023-08-05 DIAGNOSIS — R600 Localized edema: Secondary | ICD-10-CM

## 2023-08-05 DIAGNOSIS — R2689 Other abnormalities of gait and mobility: Secondary | ICD-10-CM

## 2023-08-05 NOTE — Therapy (Signed)
OUTPATIENT PHYSICAL THERAPY TREATMENT    Patient Name: Kendra Le MRN: [id number] DOB:[birth year], 68 y.o., female Today's Date: 08/05/2023   END OF SESSION:  PT End of Session - 08/05/23 1021     Visit Number 12    Date for PT Re-Evaluation 08/10/23    Authorization Type [employer]    Progress Note Due on Visit 20    PT Start Time 1017    PT Stop Time 1100    PT Time Calculation (min) 43 min    Activity Tolerance Patient tolerated treatment well    Behavior During Therapy [facility] for tasks assessed/performed                      Past Medical History:  Diagnosis Date   Arthritis    Atypical nevus 05/23/2000   Left Upper Arm-Mild   Atypical nevus 05/25/2006   Right Calf-Moderate   Atypical nevus 01/23/2010   Left Upper Inner Arm-Mild   Atypical nevus 08/06/2015   Left Forearm-Mild   BCC (basal cell carcinoma of skin) 03/31/2004   Right Lower Back (tx p bx)   GERD (gastroesophageal reflux disease)    History of kidney stones 2010   Migraine    PONV (postoperative nausea and vomiting)    Superficial basal cell carcinoma (BCC) 10/15/1999   Lower Post Neck, Left Mid Back,Left Lower Back and Upper Sternum (all Cx3,5FU)   Superficial basal cell carcinoma (BCC) 03/01/2014   Left Post Shoulder (Cx3,5FU)   Superficial basal cell carcinoma (BCC) 06/26/2014   Right Shoulder (Cx3,5FU)   Superficial basal cell carcinoma (BCC) 06/28/2017   Sup Upper Sternum (tx p bx)   Past Surgical History:  Procedure Laterality Date   ABDOMINAL HYSTERECTOMY     ANTERIOR CRUCIATE LIGAMENT REPAIR     left   BREAST SURGERY     CHOLECYSTECTOMY     TOTAL KNEE ARTHROPLASTY Left 06/13/2023   Procedure: LEFT TOTAL KNEE ARTHROPLASTY;  Surgeon: Tarry Kos, MD;  Location: [facility] OR;  Service: Orthopedics;  Laterality: Left;   Patient Active Problem List   Diagnosis Date Noted   Status post total left knee replacement 06/13/2023   Primary osteoarthritis of right knee 09/01/2021    Primary osteoarthritis of left knee 09/01/2021   Ingrown toenail 12/21/2019   Left hip pain 09/04/2017   Right shoulder pain 09/04/2017   Arthralgia of right temporomandibular joint 11/16/2016   Sensorineural hearing loss (SNHL) of both ears 11/16/2016   Tinnitus of left ear 11/16/2016   Synovial cyst 08/07/2015   Trochanteric bursitis of both hips 10/22/2014   Plantar fasciitis, left 12/24/2013   Left knee pain 10/15/2011    PCP: Noberto Retort, MD   REFERRING PROVIDER: Tarry Kos, MD   REFERRING DIAG: [phone] (ICD-10-CM) - Primary osteoarthritis of left knee   THERAPY DIAG:  Acute pain of left knee  Stiffness of left knee, not elsewhere classified  Muscle weakness (generalized)  Other abnormalities of gait and mobility  Localized edema  RATIONALE FOR EVALUATION AND TREATMENT: Rehabilitation  ONSET DATE: 06/13/23 - L TKA  NEXT MD VISIT: 07/28/23   SUBJECTIVE:  SUBJECTIVE STATEMENT: Pt reports no issues today.  EVAL:  S/p L TKA on 06/13/23. HH PT completed 06/21/23.  Uses CPM for ~1 hr at time but limited tolerance due to RLS - CPM due to be returned on Monday.  Pt reports she has been moving around well at home and weaned herself to the [facility] as of yesterday.  Pain not too bad during the day but still interferes with sleep.  She notes a popping feeling which the MD told her might be from her patella or her ITB.  Still competing HH PT HEP 2x/day.  PAIN: Are you having pain? Yes: NPRS scale: 2-3/10 Pain location: L knee  Pain description: ache  Aggravating factors: end ROM knee flexion Relieving factors: Tylenol during day, Tylenol #3 and muscle relaxant at night, ice  PERTINENT HISTORY:  OA - B knees s/p L TKA 06/13/23; B hearing loss; basal cell carcinoma of skin -  multiple locations  PRECAUTIONS: None  RED FLAGS: None  WEIGHT BEARING RESTRICTIONS: No  FALLS:  Has patient fallen in last 6 months? No  LIVING ENVIRONMENT: Lives with: lives with their spouse Lives in: House/apartment Stairs: Yes: External: 2 steps; none Has following equipment at home: Single point cane, Walker - 2 wheeled, and bed side commode  OCCUPATION: Retired  PLOF: Independent and Leisure: walking daily 30-45 min, reading  PATIENT GOALS: "Be able to walk normally again and not hurt my other knee."   OBJECTIVE: (objective measures completed at initial evaluation unless otherwise dated)  DIAGNOSTIC FINDINGS:  06/13/23 - DG Left knee: Left knee arthroplasty without immediate postoperative complication.  03/22/23 - XR Left knee: X-rays of the left knee show advanced tricompartmental degenerative joint disease worse in the patellofemoral compartment.   PATIENT SURVEYS:  LEFS 40 / 80 = 50.0 %  COGNITION: Overall cognitive status: Within functional limits for tasks assessed    SENSATION: WFL  EDEMA:  Mild/mod edema in L knee and lower leg  PALPATION: Mildly reduced L patellar mobility, predominantly superiorly/inferiorly  MUSCLE LENGTH:  (07/01/23) Hamstrings: mild tight L ITB: mild tight L Piriformis: mild tight L Hip flexors: WFL Quads: mod tight L Heelcord: mild tight L  LOWER EXTREMITY ROM:  Active ROM Right eval Left eval L 07/06/23 L 07/15/23 L 07/21/23 L  07/29/23  Knee flexion 135 105 114 117 120  supine 124  Knee extension 0 -13 -8 -4 0 supported in supine -4 seated LAQ      0 quad set supine   Passive ROM Left eval L 07/06/23 L 07/15/23  Knee flexion 109  121  Knee extension -4 -2 0  (Blank rows = not tested)  LOWER EXTREMITY MMT:  MMT Right eval Left eval R 07/19/23 L 07/19/23  Hip flexion 5 4- 5 4+  Hip extension 4+ 4 5 4+  Hip abduction 5 4 4+ 4  Hip adduction 5 4- 5 4+  Hip internal rotation 5 4 5  4+  Hip external rotation 4+ 3+ 4+ 4-   Knee flexion 5 4 5  4+  Knee extension 5 4- 4+ 4+  Ankle dorsiflexion 5 5 5 5   Ankle plantarflexion      Ankle inversion      Ankle eversion       (Blank rows = not tested)  FUNCTIONAL TESTS:  5 times sit to stand: 16.88 sec Timed up and go (TUG): 13.85 sec with SPC Dynamic Gait Index: 19/24  07/29/23: DGI: 23/24  GAIT: Distance walked: clinic distances Assistive device utilized:  Single point cane Level of assistance: Modified independence Gait pattern: decreased stance time- Left, decreased stride length, decreased hip/knee flexion- Left, and lateral lean- Right   TODAY'S TREATMENT:  08/05/23 THERAPEUTIC EXERCISE: To improve strength, endurance, ROM, and flexibility.  Demonstration, verbal and tactile cues throughout for technique.  Nustep L6x17min  Black TB TKE x 20 BATCA knee flexion B LE 25# 2 x 10, L 15# 2x10 BATCA knee extension B LE 20# 2 x 10, L 5# 2x10  NEUROMUSCULAR RE-EDUCATION: To improve balance, coordination, kinesthetic sense, proprioception, Standing toe raise with black TB TKE x 20 Retro step black TB TKE x 20 Squats with black TB 2x10 Black TB TKE with R LE reaching laterally 3 ways 10x each Manual Therapy: to decrease muscle spasm, pain and improve mobility.  IASTM to quad with rolling stick Manual quad stretch in prone  08/02/23 THERAPEUTIC EXERCISE: To improve strength, endurance, ROM, and flexibility.  Demonstration, verbal and tactile cues throughout for technique.  Rec Bike - L4 x 6 min Standing gastroc stretch at wall 2 x 60" L black TB TKE 2 x 10 - cues to only bend/straighten knee, avoiding fwd or backward movement at pelvis BATCA leg press B 25# 2 x 10, L LE 10# 2 x 10 BATCA knee flexion B con/ecc 25# 2 x 10, B con/L ecc 20# 2 x 10 BATCA knee extension B con/ecc 20# x 10, B con/L ecc 15# 2 x 10  MANUAL THERAPY: To promote improved joint mobility and increased ROM utilizing joint mobilization.  L knee femorotibial AP mobs + femoral IR in  standing gastroc stretch position to promote increased L knee extension  NEUROMUSCULAR RE-EDUCATION: To improve balance, coordination, kinesthetic sense, proprioception, and reduce fall risk. Fitter hip ABD (1 black/1 blue) 2 x 10, UE support on back of chair for balance Fitter hip extension (1 black/1 blue) 2 x 10, UE support on back of chair for balance L fwd step-up/down to 6" step + black TB TKE 2 x 10, intermittent UE support on back of chair for balance L fwd step-up/down to 8" step + black TB TKE 2 x 10, intermittent UE support on back of chair for balance  MODALITIES: Game Ready vasopneumatic compression post session to L knee x 10 min, high compression, 34 to reduce post-exercise pain and swelling/edema   07/29/23  THERAPEUTIC EXERCISE: To improve strength, endurance, and flexibility.  Demonstration, verbal and tactile cues throughout for technique.  Rec Bike - L3-4 x 6 minutes Seated L KTOS piriformis stretch x 30" Seated figure-4 hip hinge piriformis stretch x 30" - more uncomfortable for L knee Side-sitting figure-4 hip hinge piriformis stretch x 30" - still uncomfortable for L knee Supine cross-body ITB stretch with strap 2 x 30" Hooklying figure-4 piriformis stretch with slight overpressure 2 x 30" S/L L hip abduction + slight extension x 10 S/L L hip abduction arc over opposite LE x 10 S/L L hip abduction + hip flexion/extension x 10 S/L L hip abduction diagonal x 10  MANUAL THERAPY: To promote normalized muscle tension, improved flexibility, improved joint mobility, and reduced pain. Trigger Point Dry Needling: Treatment instructions/education: Subsequent Treatment: Instructions provided previously at initial dry needling treatment.  Education Handout Provided: Previously Provided Consent: Patient Verbal Consent Given: Yes Treatment: Muscles Treated: L glutes medius and minimus, L piriformis Skilled palpation and monitoring of soft tissue during DN Electrical  Stimulation Performed: No Treatment Response/Outcome: Twitch Response Elicited, Palpable Increase in Muscle Length, Decreased TTP, and Improved Exercise Tolerance  STM/DTM, manual TPR and pin & stretch to muscles addressed with DN  PHYSICAL PERFORMANCE TEST or MEASUREMENT:  Dynamic Gait Index   Level Surface Normal    Change in Gait Speed Normal    Gait with Horizontal Head Turns Normal    Gait with Vertical Head Turns Normal    Gait and Pivot Turn Normal    Step Over Obstacle Normal    Step Around Obstacles Normal    Steps Mild Impairment    Total Score 23/24   DGI comment: Scores of 19 or less are predictive of falls in older community living adults       07/26/23 THERAPEUTIC EXERCISE: to improve flexibility, strength and mobility.  Demonstration, verbal and tactile cues throughout for technique.  Rec Bike - L4 x 6 minutes Seated L LE blue TB leg press 2 x 10 BATCA knee flexion B con/ecc 25# x 10, B con/L ecc 20# 2 x 10 BATCA knee extension B con/ecc 20# 2 x 10, B con/L ecc 15# 2 x 10  GAIT TRAINING: To normalize gait pattern and improve safety with stair negotiation . Stairs: Level of Assistance: Complete Independence and Modified independence Stair Negotiation Technique: Alternating Pattern Forwards with intermittent Single Rail on Right Number of Stairs: 14  Height of Stairs: 7"  Comments: Increased effort required to raise body weight on L LE during ascent and decreased L eccentric quad control evident on descent  THERAPEUTIC ACTIVITIES: To improve functional performance.  Demonstration, verbal and tactile cues throughout for technique. Lateral L eccentric step-touch downs from 6" step x 10 Fwd L eccentric step-touch downs from 6" step x 10  NEUROMUSCULAR RE-EDUCATION: To improve balance, coordination, kinesthetic sense, proprioception, and amplitude of movement. L SLS + 5-way toe tap to colored dots 2 x 5 sets, 2nd set with L blue TB TKE and tactile cues from PT at hips to  avoid fwd translation of body SLS hip ABD isometric into ball on wall 10 x 3" bil Standing hip ABD/ER isometric into ball on wall 10 x 3" bil L fwd step-up/down to 6" step + blue TB TKE 2 x 10 Wall squat + GTB hip ABD isometric 2 x 10  MODALITIES:  Game Ready vasopneumatic compression post session to L knee x 10 min, high compression, 34 to reduce post-exercise pain and swelling/edema   PATIENT EDUCATION:  Education details: HEP progression - TKE progressed to black TB and continue with current HEP  Person educated: Patient Education method: Explanation Education comprehension: verbalized understanding  HOME EXERCISE PROGRAM: Access Code: [email or web address] URL: [email or web address] Date: 07/29/2023 Prepared by: Glenetta Hew  Exercises - Mini Squat with Counter Support  - 1 x daily - 7 x weekly - 2 sets - 10 reps - 3-5 sec hold - Supine Hamstring Stretch with Strap (Mirrored)  - 2-3 x daily - 7 x weekly - 3 reps - 30 sec hold - Supine Iliotibial Band Stretch with Strap (Mirrored)  - 2-3 x daily - 7 x weekly - 3 reps - 30 sec hold - Prone Quad Stretch with Towel Roll and Strap  - 2-3 x daily - 7 x weekly - 3 reps - 30 sec hold - Prone Knee Extension Hang  - 2 x daily - 7 x weekly - 3-5 min hold - Seated Leg Press with Resistance  - 1 x daily - 3 x weekly - 2 sets - 10 reps - 3 sec hold - Standing Terminal Knee Extension with Resistance  - 1 x daily -  3 x weekly - 2 sets - 10 reps - 5 sec hold - Standing ITB Stretch  - 2-3 x daily - 7 x weekly - 3 reps - 30 sec hold - Clam with Resistance (Mirrored)  - 1 x daily - 3 x weekly - 2 sets - 10 reps - 3-5 sec hold - Standing Hip Flexion with Anchored Resistance and Chair Support  - 1 x daily - 3 x weekly - 2 sets - 10 reps - 3 sec hold - Standing Hip Adduction with Anchored Resistance  - 1 x daily - 3 x weekly - 2 sets - 10 reps - 3 sec hold - Standing Hip Extension with Anchored Resistance  - 1 x daily - 3 x weekly - 2 sets - 10 reps  - 3 sec hold - Standing Hip Abduction with Anchored Resistance  - 1 x daily - 3 x weekly - 2 sets - 10 reps - 3 sec hold - Seated Hamstring Curl with Anchored Resistance  - 1 x daily - 3 x weekly - 2 sets - 10 reps - 3 sec hold - Sitting Knee Extension with Resistance  - 1 x daily - 3 x weekly - 2 sets - 10 reps - 3-5 sec hold - Sidelying Diagonal Hip Abduction  - 1 x daily - 3 x weekly - 2 sets - 10 reps - 3 sec hold  Patient Education - Kinesiology tape - Scar Massage   ASSESSMENT:  CLINICAL IMPRESSION:  Focused on facilitating greater quad contractions utilizing black TB for tactile feedback. Cues were required to keep TKE with black TB, to maintain quad contraction. Also continued with machine strengthening interventions for isolated knee strengthening. Pt continued to do well post TKA, will most likely be ready to transition to HEP.  Aniesa will benefit from continued skilled PT to address ongoing abnormal muscle tension, ROM, strength and balance deficits to improve mobility and activity tolerance with decreased pain interference.  OBJECTIVE IMPAIRMENTS: Abnormal gait, decreased activity tolerance, decreased balance, decreased endurance, decreased knowledge of condition, decreased mobility, difficulty walking, decreased ROM, decreased strength, increased edema, increased fascial restrictions, impaired perceived functional ability, increased muscle spasms, impaired flexibility, improper body mechanics, postural dysfunction, and pain.   ACTIVITY LIMITATIONS: carrying, lifting, bending, sitting, standing, squatting, sleeping, stairs, transfers, bed mobility, bathing, and locomotion level  PARTICIPATION LIMITATIONS: meal prep, cleaning, laundry, driving, shopping, and community activity  PERSONAL FACTORS: Past/current experiences, Time since onset of injury/illness/exacerbation, and 1-2 comorbidities: OA - B knees; B hearing loss; basal cell carcinoma of skin - multiple locations  are also  affecting patient's functional outcome.   REHAB POTENTIAL: Excellent  CLINICAL DECISION MAKING: Stable/uncomplicated  EVALUATION COMPLEXITY: Low   GOALS: Goals reviewed with patient? Yes  SHORT TERM GOALS: Target date: 07/20/2023  Patient will be independent with initial HEP. Baseline: Performing HH PT HEP 2x/day - reviewed and modified on eval Goal status: MET - 07/06/23  2.  Patient will report at least 25% improvement in L knee pain to improve QOL. Baseline: 5/10 currently, up to 8-9/10 at worst (typically at night) Goal status: MET - 07/19/23 - 50% improvement  3.  Patient will demonstrate improved L knee AROM to >/= -5-115 to improve gait and stair mechanics. Baseline: L knee AROM -13-105; PROM -4-109 Goal status: MET - 07/15/23 - L knee AROM -4-117 with supported extension down to 0  LONG TERM GOALS: Target date: 08/10/2023  Patient will be independent with advanced/ongoing HEP to improve outcomes and carryover.  Baseline:  Goal status: PARTIALLY MET - 07/29/23 - Met for current HEP  2.  Patient will report at least 50-75% improvement in L knee pain to improve QOL. Baseline: 5/10 currently, up to 8-9/10 at worst (typically at night) Goal status: MET - 07/29/23 - 75% improvement  3.  Patient will demonstrate improved L knee AROM to >/= -2-120 to allow for normal gait and stair mechanics. Baseline: L knee AROM -13-105; PROM -4-109 Goal status: MET - 07/29/23 - L knee AROM -4-124 supine with 0 supported extension  4.  Patient will demonstrate improved L LE strength to >/= 4+/5 for improved stability and ease of mobility. Baseline: Refer to above LE MMT table Goal status: PARTIALLY MET - 07/19/23 - strength improving but L hip remains weaker in most muscle groups, with L hip abduction 4/5 and ER 4-/5  5.  Patient will be able to ambulate 600' w/o AD and normal gait pattern without increased pain to access community.  Baseline: Currently ambulating with SPC and mildly  antalgic gait pattern Goal status: PARTIALLY MET - 07/29/23 - good L hip and knee flexion with heel toe progression but increased lateral sway/hip instability  6. Patient will be able to ascend/descend stairs with 1 HR and reciprocal step pattern safely to access home and community.  Baseline: Mostly step to pattern due to limited L knee ROM and quad control Goal status: IN PROGRESS - 07/12/23 - able to navigate stairs reciprocally with single HR however limited eccentric quad control noted on descent  7.  Patient will report >/= 50/80 on LEFS to demonstrate improved functional ability. Baseline: 40 / 80 = 50.0 % Goal status: MET - 07/29/23 - 58 / 80 = 72.5 %  8.  Patient will demonstrate at least 22/24 on DGI to decrease risk of falls. Baseline: 19/24 Goal status: MET - 07/29/23 - 23/24   PLAN:  PT FREQUENCY: 2-3x/week  PT DURATION: 6 weeks  PLANNED INTERVENTIONS: 97164- PT Re-evaluation, 97110-Therapeutic exercises, 97530- Therapeutic activity, 97112- Neuromuscular re-education, 97535- Self Care, 47829- Manual therapy, 97116- Gait training, 97014- Electrical stimulation (unattended), [phone]- Electrical stimulation (manual), 97016- Vasopneumatic device, 97035- Ultrasound, Patient/Family education, Balance training, Stair training, Taping, Dry Needling, Joint mobilization, Scar mobilization, DME instructions, Cryotherapy, and Moist heat  PLAN FOR NEXT SESSION: progress eccentric quad and proximal LE strengthening with emphasis on L hip abduction and ER; continue to monitor L knee ROM with weekly ROM reassessment and address as indicated; MT +/- TPDN as indicated to address abnormal muscle tension and improve L knee ROM; reapplication of Kinesiotape as indicated and benefit noted; modalities including vasopneumatic compression as indicated for pain and edema management    Darleene Cleaver, PTA 08/05/2023, 12:06 PM

## 2023-08-08 ENCOUNTER — Ambulatory Visit: Payer: Medicare HMO

## 2023-08-08 DIAGNOSIS — M6281 Muscle weakness (generalized): Secondary | ICD-10-CM

## 2023-08-08 DIAGNOSIS — M25562 Pain in left knee: Secondary | ICD-10-CM

## 2023-08-08 DIAGNOSIS — R600 Localized edema: Secondary | ICD-10-CM

## 2023-08-08 DIAGNOSIS — M25662 Stiffness of left knee, not elsewhere classified: Secondary | ICD-10-CM

## 2023-08-08 DIAGNOSIS — R2689 Other abnormalities of gait and mobility: Secondary | ICD-10-CM

## 2023-08-08 NOTE — Therapy (Addendum)
 OUTPATIENT PHYSICAL THERAPY TREATMENT / DISCHARGE SUMMARY    Patient Name: Kendra Le MRN: [id number] DOB:August 15, 1955, 68 y.o., female Today's Date: 08/08/2023   END OF SESSION:  PT End of Session - 08/08/23 1024     Visit Number 13    Date for PT Re-Evaluation 08/10/23    Authorization Type [employer]    Progress Note Due on Visit 20    PT Start Time 1018    PT Stop Time 1106    PT Time Calculation (min) 48 min    Activity Tolerance Patient tolerated treatment well    Behavior During Therapy [facility] for tasks assessed/performed                       Past Medical History:  Diagnosis Date   Arthritis    Atypical nevus 05/23/2000   Left Upper Arm-Mild   Atypical nevus 05/25/2006   Right Calf-Moderate   Atypical nevus 01/23/2010   Left Upper Inner Arm-Mild   Atypical nevus 08/06/2015   Left Forearm-Mild   BCC (basal cell carcinoma of skin) 03/31/2004   Right Lower Back (tx p bx)   GERD (gastroesophageal reflux disease)    History of kidney stones 2010   Migraine    PONV (postoperative nausea and vomiting)    Superficial basal cell carcinoma (BCC) 10/15/1999   Lower Post Neck, Left Mid Back,Left Lower Back and Upper Sternum (all Cx3,5FU)   Superficial basal cell carcinoma (BCC) 03/01/2014   Left Post Shoulder (Cx3,5FU)   Superficial basal cell carcinoma (BCC) 06/26/2014   Right Shoulder (Cx3,5FU)   Superficial basal cell carcinoma (BCC) 06/28/2017   Sup Upper Sternum (tx p bx)   Past Surgical History:  Procedure Laterality Date   ABDOMINAL HYSTERECTOMY     ANTERIOR CRUCIATE LIGAMENT REPAIR     left   BREAST SURGERY     CHOLECYSTECTOMY     TOTAL KNEE ARTHROPLASTY Left 06/13/2023   Procedure: LEFT TOTAL KNEE ARTHROPLASTY;  Surgeon: Jerri Kay HERO, MD;  Location: [facility] OR;  Service: Orthopedics;  Laterality: Left;   Patient Active Problem List   Diagnosis Date Noted   Status post total left knee replacement 06/13/2023   Primary osteoarthritis of  right knee 09/01/2021   Primary osteoarthritis of left knee 09/01/2021   Ingrown toenail 12/21/2019   Left hip pain 09/04/2017   Right shoulder pain 09/04/2017   Arthralgia of right temporomandibular joint 11/16/2016   Sensorineural hearing loss (SNHL) of both ears 11/16/2016   Tinnitus of left ear 11/16/2016   Synovial cyst 08/07/2015   Trochanteric bursitis of both hips 10/22/2014   Plantar fasciitis, left 12/24/2013   Left knee pain 10/15/2011    PCP: Arloa Elsie SAUNDERS, MD   REFERRING PROVIDER: Jerri Kay HERO, MD   REFERRING DIAG: [phone] (ICD-10-CM) - Primary osteoarthritis of left knee   THERAPY DIAG:  Acute pain of left knee  Stiffness of left knee, not elsewhere classified  Muscle weakness (generalized)  Other abnormalities of gait and mobility  Localized edema  RATIONALE FOR EVALUATION AND TREATMENT: Rehabilitation  ONSET DATE: 06/13/23 - L TKA  NEXT MD VISIT: 07/28/23   SUBJECTIVE:  SUBJECTIVE STATEMENT: No new complaints.  EVAL:  S/p L TKA on 06/13/23. HH PT completed 06/21/23.  Uses CPM for ~1 hr at time but limited tolerance due to RLS - CPM due to be returned on Monday.  Pt reports she has been moving around well at home and weaned herself to the [facility] as of yesterday.  Pain not too bad during the day but still interferes with sleep.  She notes a popping feeling which the MD told her might be from her patella or her ITB.  Still competing HH PT HEP 2x/day.  PAIN: Are you having pain? Yes: NPRS scale: 2-3/10 Pain location: L knee  Pain description: ache  Aggravating factors: end ROM knee flexion Relieving factors: Tylenol  during day, Tylenol  #3 and muscle relaxant at night, ice  PERTINENT HISTORY:  OA - B knees s/p L TKA 06/13/23; B hearing loss; basal cell  carcinoma of skin - multiple locations  PRECAUTIONS: None  RED FLAGS: None  WEIGHT BEARING RESTRICTIONS: No  FALLS:  Has patient fallen in last 6 months? No  LIVING ENVIRONMENT: Lives with: lives with their spouse Lives in: House/apartment Stairs: Yes: External: 2 steps; none Has following equipment at home: Single point cane, Walker - 2 wheeled, and bed side commode  OCCUPATION: Retired  PLOF: Independent and Leisure: walking daily 30-45 min, reading  PATIENT GOALS: Be able to walk normally again and not hurt my other knee.   OBJECTIVE: (objective measures completed at initial evaluation unless otherwise dated)  DIAGNOSTIC FINDINGS:  06/13/23 - DG Left knee: Left knee arthroplasty without immediate postoperative complication.  03/22/23 - XR Left knee: X-rays of the left knee show advanced tricompartmental degenerative joint disease worse in the patellofemoral compartment.   PATIENT SURVEYS:  LEFS 40 / 80 = 50.0 %  COGNITION: Overall cognitive status: Within functional limits for tasks assessed    SENSATION: WFL  EDEMA:  Mild/mod edema in L knee and lower leg  PALPATION: Mildly reduced L patellar mobility, predominantly superiorly/inferiorly  MUSCLE LENGTH:  (07/01/23) Hamstrings: mild tight L ITB: mild tight L Piriformis: mild tight L Hip flexors: WFL Quads: mod tight L Heelcord: mild tight L  LOWER EXTREMITY ROM:  Active ROM Right eval Left eval L 07/06/23 L 07/15/23 L 07/21/23 L  07/29/23  Knee flexion 135 105 114 117 120  supine 124  Knee extension 0 -13 -8 -4 0 supported in supine -4 seated LAQ      0 quad set supine   Passive ROM Left eval L 07/06/23 L 07/15/23  Knee flexion 109  121  Knee extension -4 -2 0  (Blank rows = not tested)  LOWER EXTREMITY MMT:  MMT Right eval Left eval R 07/19/23 L 07/19/23 L 08/08/23  Hip flexion 5 4- 5 4+ 4+  Hip extension 4+ 4 5 4+ 4+  Hip abduction 5 4 4+ 4 4  Hip adduction 5 4- 5 4+   Hip internal rotation 5 4 5   4+ 4+  Hip external rotation 4+ 3+ 4+ 4- 4  Knee flexion 5 4 5  4+ 4+  Knee extension 5 4- 4+ 4+ 4+  Ankle dorsiflexion 5 5 5 5    Ankle plantarflexion       Ankle inversion       Ankle eversion        (Blank rows = not tested)  FUNCTIONAL TESTS:  5 times sit to stand: 16.88 sec Timed up and go (TUG): 13.85 sec with SPC Dynamic Gait Index: 19/24  07/29/23: DGI: 23/24  GAIT: Distance walked: clinic distances Assistive device utilized: Single point cane Level of assistance: Modified independence Gait pattern: decreased stance time- Left, decreased stride length, decreased hip/knee flexion- Left, and lateral lean- Right   TODAY'S TREATMENT:  08/08/23  THERAPEUTIC EXERCISE: To improve strength, endurance, ROM, and flexibility.  Demonstration, verbal and tactile cues throughout for technique.  Nustep L6x5min  BATCA knee flexion B LE 25# 2 x 10, L 15# 2x10 BATCA knee extension B LE 20# 2 x 10, L 5# 2x10 Single leg squats Assessed goals to assess progress with PT MODALITIES: Game Ready vasopneumatic compression post session to L knee x 10 min, med compression, 34 to reduce post-exercise pain and swelling/edema 08/05/23 THERAPEUTIC EXERCISE: To improve strength, endurance, ROM, and flexibility.  Demonstration, verbal and tactile cues throughout for technique.  Nustep L6x3min  Black TB TKE x 20 BATCA knee flexion B LE 25# 2 x 10, L 15# 2x10 BATCA knee extension B LE 20# 2 x 10, L 5# 2x10  NEUROMUSCULAR RE-EDUCATION: To improve balance, coordination, kinesthetic sense, proprioception, Standing toe raise with black TB TKE x 20 Retro step black TB TKE x 20 Squats with black TB 2x10 Black TB TKE with R LE reaching laterally 3 ways 10x each Manual Therapy: to decrease muscle spasm, pain and improve mobility.  IASTM to quad with rolling stick Manual quad stretch in prone  08/02/23 THERAPEUTIC EXERCISE: To improve strength, endurance, ROM, and flexibility.  Demonstration, verbal and  tactile cues throughout for technique.  Rec Bike - L4 x 6 min Standing gastroc stretch at wall 2 x 60 L black TB TKE 2 x 10 - cues to only bend/straighten knee, avoiding fwd or backward movement at pelvis BATCA leg press B 25# 2 x 10, L LE 10# 2 x 10 BATCA knee flexion B con/ecc 25# 2 x 10, B con/L ecc 20# 2 x 10 BATCA knee extension B con/ecc 20# x 10, B con/L ecc 15# 2 x 10  MANUAL THERAPY: To promote improved joint mobility and increased ROM utilizing joint mobilization.  L knee femorotibial AP mobs + femoral IR in standing gastroc stretch position to promote increased L knee extension  NEUROMUSCULAR RE-EDUCATION: To improve balance, coordination, kinesthetic sense, proprioception, and reduce fall risk. Fitter hip ABD (1 black/1 blue) 2 x 10, UE support on back of chair for balance Fitter hip extension (1 black/1 blue) 2 x 10, UE support on back of chair for balance L fwd step-up/down to 6 step + black TB TKE 2 x 10, intermittent UE support on back of chair for balance L fwd step-up/down to 8 step + black TB TKE 2 x 10, intermittent UE support on back of chair for balance  MODALITIES: Game Ready vasopneumatic compression post session to L knee x 10 min, high compression, 34 to reduce post-exercise pain and swelling/edema   07/29/23  THERAPEUTIC EXERCISE: To improve strength, endurance, and flexibility.  Demonstration, verbal and tactile cues throughout for technique.  Rec Bike - L3-4 x 6 minutes Seated L KTOS piriformis stretch x 30 Seated figure-4 hip hinge piriformis stretch x 30 - more uncomfortable for L knee Side-sitting figure-4 hip hinge piriformis stretch x 30 - still uncomfortable for L knee Supine cross-body ITB stretch with strap 2 x 30 Hooklying figure-4 piriformis stretch with slight overpressure 2 x 30 S/L L hip abduction + slight extension x 10 S/L L hip abduction arc over opposite LE x 10 S/L L hip abduction + hip flexion/extension x  10 S/L L hip abduction  diagonal x 10  MANUAL THERAPY: To promote normalized muscle tension, improved flexibility, improved joint mobility, and reduced pain. Trigger Point Dry Needling: Treatment instructions/education: Subsequent Treatment: Instructions provided previously at initial dry needling treatment.  Education Handout Provided: Previously Provided Consent: Patient Verbal Consent Given: Yes Treatment: Muscles Treated: L glutes medius and minimus, L piriformis Skilled palpation and monitoring of soft tissue during DN Electrical Stimulation Performed: No Treatment Response/Outcome: Twitch Response Elicited, Palpable Increase in Muscle Length, Decreased TTP, and Improved Exercise Tolerance STM/DTM, manual TPR and pin & stretch to muscles addressed with DN  PHYSICAL PERFORMANCE TEST or MEASUREMENT:  Dynamic Gait Index   Level Surface Normal    Change in Gait Speed Normal    Gait with Horizontal Head Turns Normal    Gait with Vertical Head Turns Normal    Gait and Pivot Turn Normal    Step Over Obstacle Normal    Step Around Obstacles Normal    Steps Mild Impairment    Total Score 23/24   DGI comment: Scores of 19 or less are predictive of falls in older community living adults       07/26/23 THERAPEUTIC EXERCISE: to improve flexibility, strength and mobility.  Demonstration, verbal and tactile cues throughout for technique.  Rec Bike - L4 x 6 minutes Seated L LE blue TB leg press 2 x 10 BATCA knee flexion B con/ecc 25# x 10, B con/L ecc 20# 2 x 10 BATCA knee extension B con/ecc 20# 2 x 10, B con/L ecc 15# 2 x 10  GAIT TRAINING: To normalize gait pattern and improve safety with stair negotiation. Stairs: Level of Assistance: Complete Independence and Modified independence Stair Negotiation Technique: Alternating Pattern Forwards with intermittent Single Rail on Right Number of Stairs: 14  Height of Stairs: 7  Comments: Increased effort required to raise body weight on L LE during ascent and  decreased L eccentric quad control evident on descent  THERAPEUTIC ACTIVITIES: To improve functional performance.  Demonstration, verbal and tactile cues throughout for technique. Lateral L eccentric step-touch downs from 6 step x 10 Fwd L eccentric step-touch downs from 6 step x 10  NEUROMUSCULAR RE-EDUCATION: To improve balance, coordination, kinesthetic sense, proprioception, and amplitude of movement. L SLS + 5-way toe tap to colored dots 2 x 5 sets, 2nd set with L blue TB TKE and tactile cues from PT at hips to avoid fwd translation of body SLS hip ABD isometric into ball on wall 10 x 3 bil Standing hip ABD/ER isometric into ball on wall 10 x 3 bil L fwd step-up/down to 6 step + blue TB TKE 2 x 10 Wall squat + GTB hip ABD isometric 2 x 10  MODALITIES:  Game Ready vasopneumatic compression post session to L knee x 10 min, high compression, 34 to reduce post-exercise pain and swelling/edema   PATIENT EDUCATION:  Education details: HEP progression - TKE progressed to black TB and continue with current HEP  Person educated: Patient Education method: Explanation Education comprehension: verbalized understanding  HOME EXERCISE PROGRAM: Access Code: [email or web address] URL: [email or web address] Date: 07/29/2023 Prepared by: Elijah Hidden  Exercises - Mini Squat with Counter Support  - 1 x daily - 7 x weekly - 2 sets - 10 reps - 3-5 sec hold - Supine Hamstring Stretch with Strap (Mirrored)  - 2-3 x daily - 7 x weekly - 3 reps - 30 sec hold - Supine Iliotibial Band Stretch with Strap (Mirrored)  - 2-3 x daily -  7 x weekly - 3 reps - 30 sec hold - Prone Quad Stretch with Towel Roll and Strap  - 2-3 x daily - 7 x weekly - 3 reps - 30 sec hold - Prone Knee Extension Hang  - 2 x daily - 7 x weekly - 3-5 min hold - Seated Leg Press with Resistance  - 1 x daily - 3 x weekly - 2 sets - 10 reps - 3 sec hold - Standing Terminal Knee Extension with Resistance  - 1 x daily - 3 x weekly  - 2 sets - 10 reps - 5 sec hold - Standing ITB Stretch  - 2-3 x daily - 7 x weekly - 3 reps - 30 sec hold - Clam with Resistance (Mirrored)  - 1 x daily - 3 x weekly - 2 sets - 10 reps - 3-5 sec hold - Standing Hip Flexion with Anchored Resistance and Chair Support  - 1 x daily - 3 x weekly - 2 sets - 10 reps - 3 sec hold - Standing Hip Adduction with Anchored Resistance  - 1 x daily - 3 x weekly - 2 sets - 10 reps - 3 sec hold - Standing Hip Extension with Anchored Resistance  - 1 x daily - 3 x weekly - 2 sets - 10 reps - 3 sec hold - Standing Hip Abduction with Anchored Resistance  - 1 x daily - 3 x weekly - 2 sets - 10 reps - 3 sec hold - Seated Hamstring Curl with Anchored Resistance  - 1 x daily - 3 x weekly - 2 sets - 10 reps - 3 sec hold - Sitting Knee Extension with Resistance  - 1 x daily - 3 x weekly - 2 sets - 10 reps - 3-5 sec hold - Sidelying Diagonal Hip Abduction  - 1 x daily - 3 x weekly - 2 sets - 10 reps - 3 sec hold  Patient Education - Kinesiology tape - Scar Massage   ASSESSMENT:  CLINICAL IMPRESSION: Pt wanted to wrap up PT today, so we re-assessed her goals. She still shows decreased eccentric control on L knee with stairs. Gait still shows lateral hip instability. Her strength is mostly consistent with the last assessment. Her ROM was measured 0-124 deg last visit. She denied any concerns with her HEP, added single leg squats to work on descending stairs. She has made great progress, all goals are met except LTG #4 and #5, will go on 30 day hold as of today.   OBJECTIVE IMPAIRMENTS: Abnormal gait, decreased activity tolerance, decreased balance, decreased endurance, decreased knowledge of condition, decreased mobility, difficulty walking, decreased ROM, decreased strength, increased edema, increased fascial restrictions, impaired perceived functional ability, increased muscle spasms, impaired flexibility, improper body mechanics, postural dysfunction, and pain.    ACTIVITY LIMITATIONS: carrying, lifting, bending, sitting, standing, squatting, sleeping, stairs, transfers, bed mobility, bathing, and locomotion level  PARTICIPATION LIMITATIONS: meal prep, cleaning, laundry, driving, shopping, and community activity  PERSONAL FACTORS: Past/current experiences, Time since onset of injury/illness/exacerbation, and 1-2 comorbidities: OA - B knees; B hearing loss; basal cell carcinoma of skin - multiple locations are also affecting patient's functional outcome.   REHAB POTENTIAL: Excellent  CLINICAL DECISION MAKING: Stable/uncomplicated  EVALUATION COMPLEXITY: Low   GOALS: Goals reviewed with patient? Yes  SHORT TERM GOALS: Target date: 07/20/2023  Patient will be independent with initial HEP. Baseline: Performing HH PT HEP 2x/day - reviewed and modified on eval Goal status: MET - 07/06/23  2.  Patient will report at least 25% improvement in L knee pain to improve QOL. Baseline: 5/10 currently, up to 8-9/10 at worst (typically at night) Goal status: MET - 07/19/23 - 50% improvement  3.  Patient will demonstrate improved L knee AROM to >/= -5-115 to improve gait and stair mechanics. Baseline: L knee AROM -13-105; PROM -4-109 Goal status: MET - 07/15/23 - L knee AROM -4-117 with supported extension down to 0  LONG TERM GOALS: Target date: 08/10/2023  Patient will be independent with advanced/ongoing HEP to improve outcomes and carryover.  Baseline:  Goal status:  MET - 08/08/23 - Met for current HEP  2.  Patient will report at least 50-75% improvement in L knee pain to improve QOL. Baseline: 5/10 currently, up to 8-9/10 at worst (typically at night) Goal status: MET - 07/29/23 - 75% improvement  3.  Patient will demonstrate improved L knee AROM to >/= -2-120 to allow for normal gait and stair mechanics. Baseline: L knee AROM -13-105; PROM -4-109 Goal status: MET - 07/29/23 - L knee AROM -4-124 supine with 0 supported extension  4.   Patient will demonstrate improved L LE strength to >/= 4+/5 for improved stability and ease of mobility. Baseline: Refer to above LE MMT table Goal status: PARTIALLY MET - 08/08/23 - strength improving but L hip remains weaker in most muscle groups, with L hip abduction 4/5 and ER 4/5  5.  Patient will be able to ambulate 600' w/o AD and normal gait pattern without increased pain to access community.  Baseline: Currently ambulating with SPC and mildly antalgic gait pattern Goal status: PARTIALLY MET - 07/29/23 - good L hip and knee flexion with heel toe progression but increased lateral sway/hip instability  6. Patient will be able to ascend/descend stairs with 1 HR and reciprocal step pattern safely to access home and community.  Baseline: Mostly step to pattern due to limited L knee ROM and quad control Goal status: MET - 08/08/23  7.  Patient will report >/= 50/80 on LEFS to demonstrate improved functional ability. Baseline: 40 / 80 = 50.0 % Goal status: MET - 07/29/23 - 58 / 80 = 72.5 %  8.  Patient will demonstrate at least 22/24 on DGI to decrease risk of falls. Baseline: 19/24 Goal status: MET - 07/29/23 - 23/24   PLAN:  PT FREQUENCY: 2-3x/week  PT DURATION: 6 weeks  PLANNED INTERVENTIONS: 97164- PT Re-evaluation, 97110-Therapeutic exercises, 97530- Therapeutic activity, 97112- Neuromuscular re-education, 97535- Self Care, 02859- Manual therapy, 97116- Gait training, 97014- Electrical stimulation (unattended), [phone]- Electrical stimulation (manual), 97016- Vasopneumatic device, 97035- Ultrasound, Patient/Family education, Balance training, Stair training, Taping, Dry Needling, Joint mobilization, Scar mobilization, DME instructions, Cryotherapy, and Moist heat  PLAN FOR NEXT SESSION: Transition to HEP + 30-day hold   Keywon Mestre L Hanad Leino, PTA 08/08/2023, 12:52 PM   PHYSICAL THERAPY DISCHARGE SUMMARY  Visits from Start of Care: 13   Current functional level related to goals /  functional outcomes: Refer to above clinical impression and goal assessment for status as of last visit on 08/08/2023. Patient was placed on hold for 30 days and has not needed to return to PT, therefore will proceed with discharge from PT for this episode.     Remaining deficits: As above.   Education / Equipment: HEP  Patient agrees to discharge. Patient goals were met or partially met. Patient is being discharged due to being pleased with the current functional level.  Elijah EMERSON Hidden, PT 12/13/2023, 10:04 AM  [facility] [address]  [address] [place], [place], [address] Phone: [phone]   Fax:  [phone]

## 2023-08-10 ENCOUNTER — Encounter: Payer: Medicare HMO | Admitting: Physical Therapy

## 2023-08-22 DIAGNOSIS — R051 Acute cough: Secondary | ICD-10-CM | POA: Diagnosis not present

## 2023-08-22 DIAGNOSIS — R490 Dysphonia: Secondary | ICD-10-CM | POA: Diagnosis not present

## 2023-08-22 DIAGNOSIS — Z20822 Contact with and (suspected) exposure to covid-19: Secondary | ICD-10-CM | POA: Diagnosis not present

## 2023-08-22 DIAGNOSIS — J029 Acute pharyngitis, unspecified: Secondary | ICD-10-CM | POA: Diagnosis not present

## 2023-09-05 DIAGNOSIS — R269 Unspecified abnormalities of gait and mobility: Secondary | ICD-10-CM | POA: Diagnosis not present

## 2023-09-05 DIAGNOSIS — Z87892 Personal history of anaphylaxis: Secondary | ICD-10-CM | POA: Diagnosis not present

## 2023-09-05 DIAGNOSIS — Z91040 Latex allergy status: Secondary | ICD-10-CM | POA: Diagnosis not present

## 2023-09-05 DIAGNOSIS — Z85828 Personal history of other malignant neoplasm of skin: Secondary | ICD-10-CM | POA: Diagnosis not present

## 2023-09-05 DIAGNOSIS — Z880 Allergy status to penicillin: Secondary | ICD-10-CM | POA: Diagnosis not present

## 2023-09-05 DIAGNOSIS — M1990 Unspecified osteoarthritis, unspecified site: Secondary | ICD-10-CM | POA: Diagnosis not present

## 2023-09-05 DIAGNOSIS — G2581 Restless legs syndrome: Secondary | ICD-10-CM | POA: Diagnosis not present

## 2023-09-05 DIAGNOSIS — Z8249 Family history of ischemic heart disease and other diseases of the circulatory system: Secondary | ICD-10-CM | POA: Diagnosis not present

## 2023-09-05 DIAGNOSIS — Z791 Long term (current) use of non-steroidal anti-inflammatories (NSAID): Secondary | ICD-10-CM | POA: Diagnosis not present

## 2023-09-05 DIAGNOSIS — L570 Actinic keratosis: Secondary | ICD-10-CM | POA: Diagnosis not present

## 2023-09-05 DIAGNOSIS — C44619 Basal cell carcinoma of skin of left upper limb, including shoulder: Secondary | ICD-10-CM | POA: Diagnosis not present

## 2023-09-05 DIAGNOSIS — D485 Neoplasm of uncertain behavior of skin: Secondary | ICD-10-CM | POA: Diagnosis not present

## 2023-09-06 ENCOUNTER — Other Ambulatory Visit: Payer: Self-pay

## 2023-09-08 ENCOUNTER — Other Ambulatory Visit (HOSPITAL_BASED_OUTPATIENT_CLINIC_OR_DEPARTMENT_OTHER): Payer: Self-pay

## 2023-09-08 ENCOUNTER — Ambulatory Visit: Payer: Medicare HMO | Admitting: Orthopaedic Surgery

## 2023-09-08 DIAGNOSIS — M25552 Pain in left hip: Secondary | ICD-10-CM | POA: Diagnosis not present

## 2023-09-08 DIAGNOSIS — Z96652 Presence of left artificial knee joint: Secondary | ICD-10-CM

## 2023-09-08 DIAGNOSIS — M25551 Pain in right hip: Secondary | ICD-10-CM

## 2023-09-08 MED ORDER — METHYLPREDNISOLONE ACETATE 40 MG/ML IJ SUSP
40.0000 mg | INTRAMUSCULAR | Status: AC | PRN
  Administered 2023-09-08: 40 mg via INTRA_ARTICULAR

## 2023-09-08 MED ORDER — BUPIVACAINE HCL 0.5 % IJ SOLN
5.0000 mL | INTRAMUSCULAR | Status: AC | PRN
Start: 2023-09-08 — End: 2023-09-08
  Administered 2023-09-08: 5 mL via INTRA_ARTICULAR

## 2023-09-08 MED ORDER — BUPIVACAINE HCL 0.5 % IJ SOLN
5.0000 mL | INTRAMUSCULAR | Status: AC | PRN
  Administered 2023-09-08: 5 mL via INTRA_ARTICULAR

## 2023-09-08 MED ORDER — DOXYCYCLINE HYCLATE 100 MG PO TABS
200.0000 mg | ORAL_TABLET | Freq: Once | ORAL | 6 refills | Status: AC
Start: 2023-09-08 — End: 2023-09-09
  Filled 2023-09-08: qty 2, 1d supply, fill #0

## 2023-09-08 MED ORDER — LIDOCAINE HCL 1 % IJ SOLN
5.0000 mL | INTRAMUSCULAR | Status: AC | PRN
  Administered 2023-09-08: 5 mL

## 2023-09-08 NOTE — Progress Notes (Signed)
 Office Visit Note   Patient: Kendra Le           Date of Birth: 1956-06-17           MRN: [id number] Visit Date: 09/08/2023              Requested by: Noberto Retort, MD [id number] [place],  [place] [address] PCP: Noberto Retort, MD   Assessment & Plan: Visit Diagnoses:  1. Status post total left knee replacement     Plan: Aeryn is 3 months postop from a left total knee replacement.  She is doing well overall.  Has no complaints other than some swelling after activity.  Examination shows a fully healed surgical scar.  She has excellent range of motion.  Collaterals are stable.  Continue dental prophylaxis for a total of 2 years.  Doxycycline prescribed for upcoming dental visit.  Recheck in 3 months with repeat radiographs of the left knee.  She underwent bilateral trochanteric hip injections today.  Follow-Up Instructions: Return in about 3 months (around 12/09/2023).   Orders:  Orders Placed This Encounter  Procedures   Large Joint Inj: bilateral greater trochanter   Meds ordered this encounter  Medications   doxycycline (VIBRA-TABS) 100 MG tablet    Sig: Take 2 tablets (200 mg total) by mouth once for 1 dose.    Dispense:  2 tablet    Refill:  6      Procedures: Large Joint Inj: bilateral greater trochanter on 09/08/2023 9:49 AM Indications: pain Details: 22 G needle  Arthrogram: No  Medications (Right): 5 mL lidocaine 1 %; 5 mL bupivacaine 0.5 %; 40 mg methylPREDNISolone acetate 40 MG/ML Medications (Left): 5 mL lidocaine 1 %; 5 mL bupivacaine 0.5 %; 40 mg methylPREDNISolone acetate 40 MG/ML Patient was prepped and draped in the usual sterile fashion.       Clinical Data: No additional findings.   Subjective: Chief Complaint  Patient presents with   Left Knee - Follow-up    Left total knee arthroplasty 06/13/2023    HPI  Review of Systems   Objective: Vital Signs: There were no vitals taken for this  visit.  Physical Exam  Ortho Exam  Specialty Comments:  No specialty comments available.  Imaging: No results found.   PMFS History: Patient Active Problem List   Diagnosis Date Noted   Status post total left knee replacement 06/13/2023   Primary osteoarthritis of right knee 09/01/2021   Primary osteoarthritis of left knee 09/01/2021   Ingrown toenail 12/21/2019   Left hip pain 09/04/2017   Right shoulder pain 09/04/2017   Arthralgia of right temporomandibular joint 11/16/2016   Sensorineural hearing loss (SNHL) of both ears 11/16/2016   Tinnitus of left ear 11/16/2016   Synovial cyst 08/07/2015   Trochanteric bursitis of both hips 10/22/2014   Plantar fasciitis, left 12/24/2013   Left knee pain 10/15/2011   Past Medical History:  Diagnosis Date   Arthritis    Atypical nevus 05/23/2000   Left Upper Arm-Mild   Atypical nevus 05/25/2006   Right Calf-Moderate   Atypical nevus 01/23/2010   Left Upper Inner Arm-Mild   Atypical nevus 08/06/2015   Left Forearm-Mild   BCC (basal cell carcinoma of skin) 03/31/2004   Right Lower Back (tx p bx)   GERD (gastroesophageal reflux disease)    History of kidney stones 2010   Migraine    PONV (postoperative nausea and vomiting)    Superficial basal cell carcinoma (  BCC) 10/15/1999   Lower Post Neck, Left Mid Back,Left Lower Back and Upper Sternum (all Cx3,5FU)   Superficial basal cell carcinoma (BCC) 03/01/2014   Left Post Shoulder (Cx3,5FU)   Superficial basal cell carcinoma (BCC) 06/26/2014   Right Shoulder (Cx3,5FU)   Superficial basal cell carcinoma (BCC) 06/28/2017   Sup Upper Sternum (tx p bx)    Family History  Problem Relation Age of Onset   Heart attack Mother    Hyperlipidemia Mother    Diabetes Mother    Heart attack Father    Hyperlipidemia Father    Hypertension Father    Sudden death Neg Hx     Past Surgical History:  Procedure Laterality Date   ABDOMINAL HYSTERECTOMY     ANTERIOR CRUCIATE LIGAMENT  REPAIR     left   BREAST SURGERY     CHOLECYSTECTOMY     TOTAL KNEE ARTHROPLASTY Left 06/13/2023   Procedure: LEFT TOTAL KNEE ARTHROPLASTY;  Surgeon: Tarry Kos, MD;  Location: [facility] OR;  Service: Orthopedics;  Laterality: Left;   Social History   Occupational History   Not on file  Tobacco Use   Smoking status: Never   Smokeless tobacco: Never  Vaping Use   Vaping status: Never Used  Substance and Sexual Activity   Alcohol use: No    Alcohol/week: 0.0 standard drinks of alcohol   Drug use: No   Sexual activity: Not on file

## 2023-09-19 ENCOUNTER — Other Ambulatory Visit (HOSPITAL_BASED_OUTPATIENT_CLINIC_OR_DEPARTMENT_OTHER): Payer: Self-pay

## 2023-09-20 ENCOUNTER — Other Ambulatory Visit (HOSPITAL_BASED_OUTPATIENT_CLINIC_OR_DEPARTMENT_OTHER): Payer: Self-pay

## 2023-09-20 MED ORDER — ESTRADIOL 0.025 MG/24HR TD PTTW
1.0000 | MEDICATED_PATCH | TRANSDERMAL | 0 refills | Status: AC
  Filled 2023-09-20: qty 24, 84d supply, fill #0

## 2023-09-22 ENCOUNTER — Other Ambulatory Visit: Payer: Self-pay

## 2023-09-27 ENCOUNTER — Other Ambulatory Visit (HOSPITAL_BASED_OUTPATIENT_CLINIC_OR_DEPARTMENT_OTHER): Payer: Self-pay

## 2023-09-27 DIAGNOSIS — Z6828 Body mass index (BMI) 28.0-28.9, adult: Secondary | ICD-10-CM | POA: Diagnosis not present

## 2023-09-27 DIAGNOSIS — Z1231 Encounter for screening mammogram for malignant neoplasm of breast: Secondary | ICD-10-CM | POA: Diagnosis not present

## 2023-09-27 DIAGNOSIS — Z01419 Encounter for gynecological examination (general) (routine) without abnormal findings: Secondary | ICD-10-CM | POA: Diagnosis not present

## 2023-09-27 MED ORDER — ESTRADIOL 0.025 MG/24HR TD PTTW
1.0000 | MEDICATED_PATCH | TRANSDERMAL | 3 refills | Status: AC
  Filled 2023-09-27 – 2023-12-08 (×2): qty 24, 84d supply, fill #0
  Filled 2024-03-01: qty 24, 84d supply, fill #1
  Filled 2024-05-24: qty 24, 84d supply, fill #2

## 2023-10-17 ENCOUNTER — Other Ambulatory Visit (HOSPITAL_BASED_OUTPATIENT_CLINIC_OR_DEPARTMENT_OTHER): Payer: Self-pay

## 2023-10-19 ENCOUNTER — Other Ambulatory Visit (HOSPITAL_BASED_OUTPATIENT_CLINIC_OR_DEPARTMENT_OTHER): Payer: Self-pay

## 2023-10-19 MED ORDER — ROPINIROLE HCL 0.5 MG PO TABS
0.5000 mg | ORAL_TABLET | Freq: Every day | ORAL | 3 refills | Status: DC
  Filled 2023-10-19: qty 30, 30d supply, fill #0
  Filled 2023-11-17: qty 30, 30d supply, fill #1
  Filled 2023-12-17: qty 30, 30d supply, fill #2
  Filled 2024-01-15: qty 30, 30d supply, fill #3

## 2023-10-24 ENCOUNTER — Other Ambulatory Visit: Payer: Self-pay | Admitting: Physician Assistant

## 2023-10-24 ENCOUNTER — Other Ambulatory Visit (HOSPITAL_BASED_OUTPATIENT_CLINIC_OR_DEPARTMENT_OTHER): Payer: Self-pay

## 2023-10-24 ENCOUNTER — Telehealth: Payer: Self-pay | Admitting: *Deleted

## 2023-10-24 MED ORDER — CLINDAMYCIN HCL 300 MG PO CAPS
ORAL_CAPSULE | ORAL | 2 refills | Status: AC
  Filled 2023-10-24: qty 4, 2d supply, fill #0
  Filled 2023-12-08: qty 4, 2d supply, fill #1
  Filled 2024-05-24: qty 4, 2d supply, fill #2

## 2023-10-24 NOTE — Telephone Encounter (Signed)
 Patient called requesting an antibiotic for her dental cleaning this Wednesday be sent to [facility]. Thank you.

## 2023-10-24 NOTE — Telephone Encounter (Signed)
 sent

## 2023-12-06 ENCOUNTER — Other Ambulatory Visit: Payer: Self-pay

## 2023-12-08 ENCOUNTER — Other Ambulatory Visit: Payer: Self-pay

## 2023-12-09 ENCOUNTER — Other Ambulatory Visit (INDEPENDENT_AMBULATORY_CARE_PROVIDER_SITE_OTHER): Payer: Self-pay

## 2023-12-09 ENCOUNTER — Ambulatory Visit: Admitting: Orthopaedic Surgery

## 2023-12-09 DIAGNOSIS — Z96652 Presence of left artificial knee joint: Secondary | ICD-10-CM

## 2023-12-09 NOTE — Progress Notes (Signed)
 Post-Op Visit Note   Patient: Kendra Le           Date of Birth: [birth year]           MRN: [id number] Visit Date: 12/09/2023 PCP: Roselind Congo, MD   Assessment & Plan:  Chief Complaint:  Chief Complaint  Patient presents with   Left Knee - Follow-up    Left TKA 06/13/2023   Visit Diagnoses:  1. Status post total left knee replacement     Plan: History of Present Illness Kendra Le is a 68 year old female who presents for a six-month follow-up after knee replacement surgery.  She is functioning well post-surgery but experienced some swelling after visiting the beach, possibly due to humidity and weather changes. She is satisfied with her recovery and reports that everything appears normal.  Physical Exam MUSCULOSKELETAL: Knee scar well-healed, good flexibility. Knee ligaments solid.  Assessment and Plan S/p left TKA Six months post-surgery, well-healed scar, good flexibility, solid ligaments. X-rays show well-fixed tibial component, intact cement mantle, no loosening or complications. No physical restrictions from surgery. Emphasized dental prophylaxis. - Schedule follow-up in six months for one-year post-operative evaluation and x-rays. - Schedule follow-up at two years post-operation with x-rays.  Follow-Up Instructions: Return in about 6 months (around 06/09/2024).   Orders:  Orders Placed This Encounter  Procedures   XR Knee 1-2 Views Left   No orders of the defined types were placed in this encounter.   Imaging: XR Knee 1-2 Views Left Result Date: 12/09/2023 X-rays of the left knee show a stable left total knee replacement in good alignment.    PMFS History: Patient Active Problem List   Diagnosis Date Noted   Status post total left knee replacement 06/13/2023   Primary osteoarthritis of right knee 09/01/2021   Primary osteoarthritis of left knee 09/01/2021   Ingrown toenail 12/21/2019   Left hip pain 09/04/2017   Right shoulder pain  09/04/2017   Arthralgia of right temporomandibular joint 11/16/2016   Sensorineural hearing loss (SNHL) of both ears 11/16/2016   Tinnitus of left ear 11/16/2016   Synovial cyst 08/07/2015   Trochanteric bursitis of both hips 10/22/2014   Plantar fasciitis, left 12/24/2013   Left knee pain 10/15/2011   Past Medical History:  Diagnosis Date   Arthritis    Atypical nevus 05/23/2000   Left Upper Arm-Mild   Atypical nevus 05/25/2006   Right Calf-Moderate   Atypical nevus 01/23/2010   Left Upper Inner Arm-Mild   Atypical nevus 08/06/2015   Left Forearm-Mild   BCC (basal cell carcinoma of skin) 03/31/2004   Right Lower Back (tx p bx)   GERD (gastroesophageal reflux disease)    History of kidney stones 2010   Migraine    PONV (postoperative nausea and vomiting)    Superficial basal cell carcinoma (BCC) 10/15/1999   Lower Post Neck, Left Mid Back,Left Lower Back and Upper Sternum (all Cx3,5FU)   Superficial basal cell carcinoma (BCC) 03/01/2014   Left Post Shoulder (Cx3,5FU)   Superficial basal cell carcinoma (BCC) 06/26/2014   Right Shoulder (Cx3,5FU)   Superficial basal cell carcinoma (BCC) 06/28/2017   Sup Upper Sternum (tx p bx)    Family History  Problem Relation Age of Onset   Heart attack Mother    Hyperlipidemia Mother    Diabetes Mother    Heart attack Father    Hyperlipidemia Father    Hypertension Father    Sudden death Neg Hx     Past  Surgical History:  Procedure Laterality Date   ABDOMINAL HYSTERECTOMY     ANTERIOR CRUCIATE LIGAMENT REPAIR     left   BREAST SURGERY     CHOLECYSTECTOMY     TOTAL KNEE ARTHROPLASTY Left 06/13/2023   Procedure: LEFT TOTAL KNEE ARTHROPLASTY;  Surgeon: Wes Hamman, MD;  Location: [facility] OR;  Service: Orthopedics;  Laterality: Left;   Social History   Occupational History   Not on file  Tobacco Use   Smoking status: Never   Smokeless tobacco: Never  Vaping Use   Vaping status: Never Used  Substance and Sexual Activity    Alcohol use: No    Alcohol/week: 0.0 standard drinks of alcohol   Drug use: No   Sexual activity: Not on file

## 2023-12-19 ENCOUNTER — Other Ambulatory Visit: Payer: Self-pay

## 2024-01-16 ENCOUNTER — Other Ambulatory Visit: Payer: Self-pay

## 2024-02-12 ENCOUNTER — Other Ambulatory Visit (HOSPITAL_BASED_OUTPATIENT_CLINIC_OR_DEPARTMENT_OTHER): Payer: Self-pay

## 2024-02-15 ENCOUNTER — Other Ambulatory Visit (HOSPITAL_BASED_OUTPATIENT_CLINIC_OR_DEPARTMENT_OTHER): Payer: Self-pay

## 2024-02-15 MED ORDER — ROPINIROLE HCL 0.5 MG PO TABS
0.5000 mg | ORAL_TABLET | Freq: Every day | ORAL | 0 refills | Status: DC
  Filled 2024-02-15: qty 30, 30d supply, fill #0

## 2024-02-29 ENCOUNTER — Other Ambulatory Visit (HOSPITAL_BASED_OUTPATIENT_CLINIC_OR_DEPARTMENT_OTHER): Payer: Self-pay

## 2024-03-01 ENCOUNTER — Other Ambulatory Visit: Payer: Self-pay

## 2024-03-01 ENCOUNTER — Other Ambulatory Visit (HOSPITAL_BASED_OUTPATIENT_CLINIC_OR_DEPARTMENT_OTHER): Payer: Self-pay

## 2024-03-01 MED ORDER — ROPINIROLE HCL 1 MG PO TABS
1.0000 mg | ORAL_TABLET | Freq: Every day | ORAL | 3 refills | Status: AC
  Filled 2024-03-01: qty 90, 90d supply, fill #0
  Filled 2024-05-26: qty 90, 90d supply, fill #1

## 2024-03-06 DIAGNOSIS — J029 Acute pharyngitis, unspecified: Secondary | ICD-10-CM | POA: Diagnosis not present

## 2024-03-06 DIAGNOSIS — E7800 Pure hypercholesterolemia, unspecified: Secondary | ICD-10-CM | POA: Diagnosis not present

## 2024-03-06 DIAGNOSIS — R599 Enlarged lymph nodes, unspecified: Secondary | ICD-10-CM | POA: Diagnosis not present

## 2024-03-14 ENCOUNTER — Other Ambulatory Visit: Payer: Self-pay

## 2024-03-14 ENCOUNTER — Other Ambulatory Visit (HOSPITAL_BASED_OUTPATIENT_CLINIC_OR_DEPARTMENT_OTHER): Payer: Self-pay | Admitting: Family Medicine

## 2024-03-14 ENCOUNTER — Other Ambulatory Visit (HOSPITAL_BASED_OUTPATIENT_CLINIC_OR_DEPARTMENT_OTHER): Payer: Self-pay

## 2024-03-14 DIAGNOSIS — M62830 Muscle spasm of back: Secondary | ICD-10-CM | POA: Diagnosis not present

## 2024-03-14 DIAGNOSIS — Z0000 Encounter for general adult medical examination without abnormal findings: Secondary | ICD-10-CM | POA: Diagnosis not present

## 2024-03-14 DIAGNOSIS — Z136 Encounter for screening for cardiovascular disorders: Secondary | ICD-10-CM | POA: Diagnosis not present

## 2024-03-14 DIAGNOSIS — G2581 Restless legs syndrome: Secondary | ICD-10-CM | POA: Diagnosis not present

## 2024-03-14 DIAGNOSIS — L309 Dermatitis, unspecified: Secondary | ICD-10-CM | POA: Diagnosis not present

## 2024-03-14 DIAGNOSIS — R42 Dizziness and giddiness: Secondary | ICD-10-CM | POA: Diagnosis not present

## 2024-03-14 MED ORDER — BETAMETHASONE DIPROPIONATE 0.05 % EX CREA
1.0000 | TOPICAL_CREAM | Freq: Every day | CUTANEOUS | 3 refills | Status: AC | PRN
  Filled 2024-03-14: qty 45, 30d supply, fill #0

## 2024-03-14 MED ORDER — ROPINIROLE HCL 1 MG PO TABS
1.0000 mg | ORAL_TABLET | Freq: Every day | ORAL | 3 refills | Status: AC
  Filled 2024-03-14: qty 90, 90d supply, fill #0

## 2024-03-14 MED ORDER — MECLIZINE HCL 12.5 MG PO TABS
12.5000 mg | ORAL_TABLET | Freq: Two times a day (BID) | ORAL | 5 refills | Status: AC | PRN
  Filled 2024-03-14: qty 30, 8d supply, fill #0

## 2024-03-14 MED ORDER — ROPINIROLE HCL 0.5 MG PO TABS
0.5000 mg | ORAL_TABLET | Freq: Every day | ORAL | 0 refills | Status: AC
  Filled 2024-03-14: qty 30, 30d supply, fill #0

## 2024-03-14 MED ORDER — CYCLOBENZAPRINE HCL 5 MG PO TABS
5.0000 mg | ORAL_TABLET | Freq: Every evening | ORAL | 5 refills | Status: AC | PRN
  Filled 2024-03-14: qty 30, 30d supply, fill #0

## 2024-03-15 ENCOUNTER — Other Ambulatory Visit: Payer: Self-pay

## 2024-03-29 ENCOUNTER — Ambulatory Visit (HOSPITAL_BASED_OUTPATIENT_CLINIC_OR_DEPARTMENT_OTHER)
Admission: RE | Admit: 2024-03-29 | Discharge: 2024-03-29 | Disposition: A | Discharge status: Home / Self Care | Payer: Self-pay | Source: Ambulatory Visit | Attending: Family Medicine | Admitting: Family Medicine

## 2024-03-29 DIAGNOSIS — Z136 Encounter for screening for cardiovascular disorders: Secondary | ICD-10-CM | POA: Insufficient documentation

## 2024-04-10 ENCOUNTER — Other Ambulatory Visit (HOSPITAL_BASED_OUTPATIENT_CLINIC_OR_DEPARTMENT_OTHER): Payer: Self-pay

## 2024-04-23 ENCOUNTER — Encounter: Payer: Self-pay | Admitting: Radiology

## 2024-05-18 ENCOUNTER — Other Ambulatory Visit (HOSPITAL_BASED_OUTPATIENT_CLINIC_OR_DEPARTMENT_OTHER): Payer: Self-pay

## 2024-06-07 ENCOUNTER — Ambulatory Visit: Admitting: Orthopaedic Surgery

## 2024-06-07 ENCOUNTER — Other Ambulatory Visit: Payer: Self-pay

## 2024-06-07 DIAGNOSIS — Z96652 Presence of left artificial knee joint: Secondary | ICD-10-CM | POA: Diagnosis not present

## 2024-06-07 NOTE — Progress Notes (Signed)
 Post-Op Visit Note   Patient: Kendra Le           Date of Birth: May 22, 1956           MRN: [id number] Visit Date: 06/07/2024 PCP: Arloa Elsie SAUNDERS, MD   Assessment & Plan:  Chief Complaint:  Chief Complaint  Patient presents with   Left Knee - Follow-up    Left TKA 06/13/2023   Visit Diagnoses:  1. Status post total left knee replacement     Plan: History of Present Illness Kendra Le is a 68 year old female who presents for 1 year follow-up of her knee replacement surgery.  She has no pain and reports functional range of motion but wants more flexion, as she has difficulty putting on her sock because the knee will not bend enough. She notes a sensation of tethering from scar tissue.  She has traveled to [place], [place], and [employer] and stays active, indicating her knee does not limit her usual activities, though she sometimes feels tired.  Physical Exam MUSCULOSKELETAL: Left knee shows fully healed surgical scar.  Collaterals are stable.  No joint effusion.  Range of motion 0 to 115 degrees  Assessment and Plan Status post total left knee replacement Left knee replacement functioning well.  - Continue current management and monitoring. - Scheduled follow-up in one year for two-year visit. - Continue antibiotics for dental procedures for one more year.  Follow-Up Instructions: Return in about 1 year (around 06/07/2025).   Orders:  Orders Placed This Encounter  Procedures   XR Knee 1-2 Views Left   No orders of the defined types were placed in this encounter.   Imaging: XR Knee 1-2 Views Left Result Date: 06/07/2024 X-rays of the left knee show a stable left total knee replacement in good alignment.    PMFS History: Patient Active Problem List   Diagnosis Date Noted   Status post total left knee replacement 06/13/2023   Primary osteoarthritis of right knee 09/01/2021   Primary osteoarthritis of left knee 09/01/2021   Ingrown toenail  12/21/2019   Left hip pain 09/04/2017   Right shoulder pain 09/04/2017   Arthralgia of right temporomandibular joint 11/16/2016   Sensorineural hearing loss (SNHL) of both ears 11/16/2016   Tinnitus of left ear 11/16/2016   Synovial cyst 08/07/2015   Trochanteric bursitis of both hips 10/22/2014   Plantar fasciitis, left 12/24/2013   Left knee pain 10/15/2011   Past Medical History:  Diagnosis Date   Arthritis    Atypical nevus 05/23/2000   Left Upper Arm-Mild   Atypical nevus 05/25/2006   Right Calf-Moderate   Atypical nevus 01/23/2010   Left Upper Inner Arm-Mild   Atypical nevus 08/06/2015   Left Forearm-Mild   BCC (basal cell carcinoma of skin) 03/31/2004   Right Lower Back (tx p bx)   GERD (gastroesophageal reflux disease)    History of kidney stones 2010   Migraine    PONV (postoperative nausea and vomiting)    Superficial basal cell carcinoma (BCC) 10/15/1999   Lower Post Neck, Left Mid Back,Left Lower Back and Upper Sternum (all Cx3,5FU)   Superficial basal cell carcinoma (BCC) 03/01/2014   Left Post Shoulder (Cx3,5FU)   Superficial basal cell carcinoma (BCC) 06/26/2014   Right Shoulder (Cx3,5FU)   Superficial basal cell carcinoma (BCC) 06/28/2017   Sup Upper Sternum (tx p bx)    Family History  Problem Relation Age of Onset   Heart attack Mother    Hyperlipidemia Mother  Diabetes Mother    Heart attack Father    Hyperlipidemia Father    Hypertension Father    Sudden death Neg Hx     Past Surgical History:  Procedure Laterality Date   ABDOMINAL HYSTERECTOMY     ANTERIOR CRUCIATE LIGAMENT REPAIR     left   BREAST SURGERY     CHOLECYSTECTOMY     TOTAL KNEE ARTHROPLASTY Left 06/13/2023   Procedure: LEFT TOTAL KNEE ARTHROPLASTY;  Surgeon: Jerri Kay HERO, MD;  Location: [facility] OR;  Service: Orthopedics;  Laterality: Left;   Social History   Occupational History   Not on file  Tobacco Use   Smoking status: Never   Smokeless tobacco: Never  Vaping Use    Vaping status: Never Used  Substance and Sexual Activity   Alcohol use: No    Alcohol/week: 0.0 standard drinks of alcohol   Drug use: No   Sexual activity: Not on file

## 2024-06-08 ENCOUNTER — Ambulatory Visit: Attending: Cardiovascular Disease | Admitting: Cardiovascular Disease

## 2024-06-08 ENCOUNTER — Encounter: Payer: Self-pay | Admitting: Cardiovascular Disease

## 2024-06-08 ENCOUNTER — Ambulatory Visit: Admitting: Orthopaedic Surgery

## 2024-06-08 VITALS — BP 122/70 | HR 74 | Ht 62.0 in | Wt 165.0 lb

## 2024-06-08 DIAGNOSIS — R079 Chest pain, unspecified: Secondary | ICD-10-CM

## 2024-06-08 DIAGNOSIS — R931 Abnormal findings on diagnostic imaging of heart and coronary circulation: Secondary | ICD-10-CM | POA: Diagnosis not present

## 2024-06-08 DIAGNOSIS — E782 Mixed hyperlipidemia: Secondary | ICD-10-CM

## 2024-06-08 MED ORDER — ROSUVASTATIN CALCIUM 10 MG PO TABS: 10.0000 mg | ORAL_TABLET | Freq: Every day | ORAL | 3 refills | Status: AC

## 2024-06-08 NOTE — Patient Instructions (Addendum)
 Medication Instructions:  START Rosuvastatin (Crestor) 10 mg once daily   *If you need a refill on your cardiac medications before your next appointment, please call your pharmacy*  Lab Work: To be completed in 3 months (approximately 09/06/24): lipid panel, LFT  If you have labs (blood work) drawn today and your tests are completely normal, you will receive your results only by: MyChart Message (if you have MyChart) OR A paper copy in the mail If you have any lab test that is abnormal or we need to change your treatment, we will call you to review the results.  Testing/Procedures: Your physician has requested that you have en exercise stress myoview. For further information please visit [email or web address] Please follow instruction sheet, as given.  Follow-Up: At [facility], you and your health needs are our priority.  As part of our continuing mission to provide you with exceptional heart care, our providers are all part of one team.  This team includes your primary Cardiologist (physician) and Advanced Practice Providers or APPs (Physician Assistants and Nurse Practitioners) who all work together to provide you with the care you need, when you need it.  Your next appointment:   1 year(s)  Provider:   Ozell Fell, MD    We recommend signing up for the patient portal called MyChart.  Sign up information is provided on this After Visit Summary.  MyChart is used to connect with patients for Virtual Visits (Telemedicine).  Patients are able to view lab/test results, encounter notes, upcoming appointments, etc.  Non-urgent messages can be sent to your provider as well.   To learn more about what you can do with MyChart, go to [email or web address].   Other Instructions Exercise Myoview (Stress Test) Instructions  Please arrive 15 minutes prior to your appointment time for registration and insurance purposes.   The test will take approximately 3 to 4 hours to complete;  you may bring reading material.  If someone comes with you to your appointment, they will need to remain in the main lobby due to limited space in the testing area. **If you are pregnant or breastfeeding, please notify the nuclear lab prior to your appointment**   How to prepare for your Myocardial Perfusion Test: Do not eat or drink 3 hours prior to your test, except you may have water. Do not consume products containing caffeine (regular or decaffeinated) 12 hours prior to your test. (ex: coffee, chocolate, sodas, tea). Do bring a list of your current medications with you.   Do wear comfortable clothes (no dresses or overalls) and walking shoes, tennis shoes preferred (No heels or open toe shoes are allowed). Do NOT wear cologne, perfume, aftershave, or lotions (deodorant is allowed). If these instructions are not followed, your test will have to be rescheduled.   Please report to [address] [place], [place] [address] for your test.  If you have questions or concerns about your appointment, you can call the Nuclear Lab at [phone].   If you cannot keep your appointment, please provide 24 hours notification to the Nuclear Lab, to avoid a possible $50 charge to your account.

## 2024-06-08 NOTE — Progress Notes (Signed)
 " Cardiology Office Note:    Date:  06/08/2024   ID:  Kendra Le, DOB 04/29/1956, MRN [id number]  PCP:  Arloa Elsie SAUNDERS, MD   [facility] Providers Cardiologist:  Ozell Fell, MD     Referring MD: Arloa Elsie SAUNDERS, MD   Chief Complaint  Patient presents with   Elevated Coronary Calcium Score    History of Present Illness:    Kendra Le is a 68 y.o. female presenting for evaluation of elevated coronary calcium score.  The patient underwent recent CT cardiac scoring test demonstrated total coronary artery calcium score of 113 with 0 in the left main, 24 in the LAD, 81 in the left circumflex, and 9 in the right coronary artery.  This placed her at the 81st percentile for age, race, and gender.  The patient is here alone today.  She has been concerned about intermittent left jaw pain and whether this could be an anginal equivalent. It has not associated with physical exertion. She walks 30 minutes every day and she does strength training twice/week without associated symptoms.  She denies exertional dyspnea, heart palpitations, orthopnea, or PND.  The patient is a lifetime non-smoker.  There is a strong family history of CAD as her father had an MI in his 34s and her mother had an MI in her 59s.  The patient has hyperlipidemia that has been untreated.  Past Medical History:  Diagnosis Date   Arthritis    Atypical nevus 05/23/2000   Left Upper Arm-Mild   Atypical nevus 05/25/2006   Right Calf-Moderate   Atypical nevus 01/23/2010   Left Upper Inner Arm-Mild   Atypical nevus 08/06/2015   Left Forearm-Mild   BCC (basal cell carcinoma of skin) 03/31/2004   Right Lower Back (tx p bx)   GERD (gastroesophageal reflux disease)    History of kidney stones 2010   Migraine    PONV (postoperative nausea and vomiting)    Superficial basal cell carcinoma (BCC) 10/15/1999   Lower Post Neck, Left Mid Back,Left Lower Back and Upper Sternum (all Cx3,5FU)   Superficial basal  cell carcinoma (BCC) 03/01/2014   Left Post Shoulder (Cx3,5FU)   Superficial basal cell carcinoma (BCC) 06/26/2014   Right Shoulder (Cx3,5FU)   Superficial basal cell carcinoma (BCC) 06/28/2017   Sup Upper Sternum (tx p bx)   Past Surgical History:  Procedure Laterality Date   ABDOMINAL HYSTERECTOMY     ANTERIOR CRUCIATE LIGAMENT REPAIR     left   BREAST SURGERY     CHOLECYSTECTOMY     TOTAL KNEE ARTHROPLASTY Left 06/13/2023   Procedure: LEFT TOTAL KNEE ARTHROPLASTY;  Surgeon: Jerri Kay HERO, MD;  Location: [facility] OR;  Service: Orthopedics;  Laterality: Left;   Family History  Problem Relation Age of Onset   Heart attack Mother    Hyperlipidemia Mother    Diabetes Mother    Heart attack Father    Hyperlipidemia Father    Hypertension Father    Sudden death Neg Hx    Father MI - 76's, smoker Mother MI - 51's, smoker  Current Medications: Active Medications[1]   Allergies:   Erythromycin, Latex, and Penicillins   ROS:   Please see the history of present illness.    All other systems reviewed and are negative.  EKGs/Labs/Other Studies Reviewed:    The following studies were reviewed today: Cardiac Studies & Procedures   ______________________________________________________________________________________________          CT SCANS  CT CARDIAC SCORING (  SELF PAY ONLY) 03/29/2024  Addendum 04/02/2024 10:59 AM ADDENDUM REPORT: 04/02/2024 10:56  EXAM: OVER-READ INTERPRETATION  CT CHEST  The following report is an over-read performed by radiologist Dr. Elspeth Dada [facility] on 04/02/2024. This over-read does not include interpretation of cardiac or coronary anatomy or pathology. The cardiovascular interpretation by the cardiologist is attached.  COMPARISON:  Abdomen and pelvis CT dated 10/16/2004  FINDINGS: Mediastinum: Unremarkable.  Lungs and pleura: Unremarkable.  Upper abdomen: The included portion of the stomach is filled with fluid  with a partially included tubular area of calcific and air density posteriorly, not previously present. The air component measures -322 Hounsfield units in density centrally.  Musculoskeletal: Thoracic spine degenerative changes.  IMPRESSION: The included portion of the stomach is filled with fluid with a partially included tubular area of calcific and air density posteriorly, not previously present. This could represent a partially included bone fragment or other foreign body. Otherwise, unremarkable examination.   Electronically Signed By: Elspeth Bathe M.D. On: 04/02/2024 10:56  Narrative : CLINICAL DATA:  Cardiovascular Disease Risk stratification  EXAM:  Coronary Calcium Score  TECHNIQUE:  A gated, non-contrast computed tomography scan of the heart was  performed using 3mm slice thickness. Axial images were analyzed on a  dedicated workstation. Calcium scoring of the coronary arteries was  performed using the Agatston method.  FINDINGS:  Coronary Calcium Score:  Left main: 0  Left anterior descending artery: 23.9  Left circumflex artery: 80.6  Right coronary artery: 8.77  Total: 113  Pericardium: Normal.  Ascending Aorta: Normal caliber.  Pulmonary artery: Normal caliber  Non-cardiac: See separate report from [facility].  IMPRESSION:  Coronary calcium score of 113. This was 4 percentile for age-, race-,  and sex-matched controls.  RECOMMENDATIONS:  Coronary artery calcium (CAC) score is a strong predictor of  incident coronary heart disease (CHD) and provides predictive  information beyond traditional risk factors. CAC scoring is  reasonable to use in the decision to withhold, postpone, or initiate  statin therapy in intermediate-risk or selected borderline-risk  asymptomatic adults (age 9-75 years and LDL-C >=70 to <190 mg/dL)  who do not have diabetes or established atherosclerotic  cardiovascular disease (ASCVD).* In  intermediate-risk (10-year ASCVD  risk >=7.5% to <20%) adults or selected borderline-risk (10-year  ASCVD risk >=5% to <7.5%) adults in whom a CAC score is measured for  the purpose of making a treatment decision the following  recommendations have been made:  If CAC=0, it is reasonable to withhold statin therapy and reassess  in 5 to 10 years, as long as higher risk conditions are absent  (diabetes mellitus, family history of premature CHD in first degree  relatives (males <55 years; females <65 years), cigarette smoking,  or LDL >=190 mg/dL).  If CAC is 1 to 99, it is reasonable to initiate statin therapy for  patients >=43 years of age.  If CAC is >=100 or >=75th percentile, it is reasonable to initiate  statin therapy at any age.  Cardiology referral should be considered for patients with CAC  scores >=400 or >=75th percentile.  *2018 AHA/ACC/AACVPR/AAPA/ABC/ACPM/ADA/AGS/APhA/ASPC/NLA/PCNA  Guideline on the Management of Blood Cholesterol: A Report of the  American College of Cardiology/American Heart Association Task Force  on Clinical Practice Guidelines. J Am Coll Cardiol.  2019;73(24):3168-3209.  Electronically Signed: By: Lamar Fitch M.D. On: 04/01/2024 20:27     ______________________________________________________________________________________________      EKG:   EKG Interpretation Date/Time:  Friday June 08 2024 14:41:25 EST  Ventricular Rate:  74 PR Interval:  166 QRS Duration:  82 QT Interval:  398 QTC Calculation: 441 R Axis:   -44  Text Interpretation: Normal sinus rhythm Left axis deviation Confirmed by Wonda Sharper [id number]) on 06/08/2024 3:05:29 PM    Recent Labs: No results found for requested labs within last 365 days.  Recent Lipid Panel No results found for: CHOL, TRIG, HDL, CHOLHDL, VLDL, LDLCALC, LDLDIRECT   Risk Assessment/Calculations:                Physical Exam:    VS:  BP 122/70 (BP  Location: Right Arm, Patient Position: Sitting, Cuff Size: Normal)   Pulse 74   Ht 5' 2 (1.575 m)   Wt 165 lb (74.8 kg)   SpO2 98%   BMI 30.18 kg/m     Wt Readings from Last 3 Encounters:  06/08/24 165 lb (74.8 kg)  06/13/23 165 lb (74.8 kg)  06/07/23 169 lb 11.2 oz (77 kg)     GEN:  Well nourished, well developed in no acute distress HEENT: Normal NECK: No JVD; No carotid bruits LYMPHATICS: No lymphadenopathy CARDIAC: RRR, no murmurs, rubs, gallops RESPIRATORY:  Clear to auscultation without rales, wheezing or rhonchi  ABDOMEN: Soft, non-tender, non-distended MUSCULOSKELETAL:  No edema; No deformity  SKIN: Warm and dry NEUROLOGIC:  Alert and oriented x 3 PSYCHIATRIC:  Normal affect   Assessment & Plan High coronary artery calcium score Recommend rosuvastatin 10 mg daily.  Lipids reviewed with cholesterol 233, HDL 64, LDL 133, triglycerides 145.  Check lipids and LFTs in 3 months.  She is concerned about her ability to tolerate a statin drug due to her mother's history of having severe myalgias on statins.  Advised her that if symptoms arise, to stop the medication and send me a message and I would refer her for consideration of a PCSK9 inhibitor. Chest pain of uncertain etiology Patient with left jaw pain, concern for anginal equivalent.  With her high coronary calcium score, I have recommended an exercise Myoview stress test to evaluate for ischemia. Mixed hyperlipidemia As above, start rosuvastatin, repeat lipids in 3 months, goal LDL cholesterol less than 70 mg/dL.       Informed Consent   Shared Decision Making/Informed Consent The risks [chest pain, shortness of breath, cardiac arrhythmias, dizziness, blood pressure fluctuations, myocardial infarction, stroke/transient ischemic attack, nausea, vomiting, allergic reaction, radiation exposure, metallic taste sensation and life-threatening complications (estimated to be 1 in 10,000)], benefits (risk stratification,  diagnosing coronary artery disease, treatment guidance) and alternatives of a nuclear stress test were discussed in detail with Ms. Hebb and she agrees to proceed.       Medication Adjustments/Labs and Tests Ordered: Current medicines are reviewed at length with the patient today.  Concerns regarding medicines are outlined above.  Orders Placed This Encounter  Procedures   Lipid panel   Hepatic function panel   Cardiac Stress Test: Informed Consent Details: Physician/Practitioner Attestation; Transcribe to consent form and obtain patient signature   MYOCARDIAL PERFUSION IMAGING   EKG 12-Lead   Meds ordered this encounter  Medications   rosuvastatin (CRESTOR) 10 MG tablet    Sig: Take 1 tablet (10 mg total) by mouth daily.    Dispense:  30 tablet    Refill:  3    Patient Instructions  Medication Instructions:  START Rosuvastatin (Crestor) 10 mg once daily   *If you need a refill on your cardiac medications before your next appointment, please call your pharmacy*  Lab  Work: To be completed in 3 months (approximately 09/06/24): lipid panel, LFT  If you have labs (blood work) drawn today and your tests are completely normal, you will receive your results only by: MyChart Message (if you have MyChart) OR A paper copy in the mail If you have any lab test that is abnormal or we need to change your treatment, we will call you to review the results.  Testing/Procedures: Your physician has requested that you have en exercise stress myoview. For further information please visit [email or web address] Please follow instruction sheet, as given.  Follow-Up: At [facility], you and your health needs are our priority.  As part of our continuing mission to provide you with exceptional heart care, our providers are all part of one team.  This team includes your primary Cardiologist (physician) and Advanced Practice Providers or APPs (Physician Assistants and Nurse Practitioners) who  all work together to provide you with the care you need, when you need it.  Your next appointment:   1 year(s)  Provider:   Ozell Fell, MD    We recommend signing up for the patient portal called MyChart.  Sign up information is provided on this After Visit Summary.  MyChart is used to connect with patients for Virtual Visits (Telemedicine).  Patients are able to view lab/test results, encounter notes, upcoming appointments, etc.  Non-urgent messages can be sent to your provider as well.   To learn more about what you can do with MyChart, go to [email or web address].   Other Instructions Exercise Myoview (Stress Test) Instructions  Please arrive 15 minutes prior to your appointment time for registration and insurance purposes.   The test will take approximately 3 to 4 hours to complete; you may bring reading material.  If someone comes with you to your appointment, they will need to remain in the main lobby due to limited space in the testing area. **If you are pregnant or breastfeeding, please notify the nuclear lab prior to your appointment**   How to prepare for your Myocardial Perfusion Test: Do not eat or drink 3 hours prior to your test, except you may have water. Do not consume products containing caffeine (regular or decaffeinated) 12 hours prior to your test. (ex: coffee, chocolate, sodas, tea). Do bring a list of your current medications with you.   Do wear comfortable clothes (no dresses or overalls) and walking shoes, tennis shoes preferred (No heels or open toe shoes are allowed). Do NOT wear cologne, perfume, aftershave, or lotions (deodorant is allowed). If these instructions are not followed, your test will have to be rescheduled.   Please report to [address], [place], [place] [address] for your test.  If you have questions or concerns about your appointment, you can call the Nuclear Lab at [phone].   If you cannot keep your appointment, please provide 24  hours notification to the Nuclear Lab, to avoid a possible $50 charge to your account.     Signed, Ozell Fell, MD  06/08/2024 5:56 PM    [facility]     [1]  Current Meds  Medication Sig   clindamycin  (CLEOCIN ) 300 MG capsule Take 2 capsules one hour prior to dental work.   rosuvastatin (CRESTOR) 10 MG tablet Take 1 tablet (10 mg total) by mouth daily.   "

## 2024-06-20 ENCOUNTER — Other Ambulatory Visit: Payer: Self-pay | Admitting: Cardiovascular Disease

## 2024-06-20 DIAGNOSIS — R079 Chest pain, unspecified: Secondary | ICD-10-CM

## 2024-06-22 ENCOUNTER — Telehealth (HOSPITAL_COMMUNITY): Payer: Self-pay | Admitting: *Deleted

## 2024-06-22 NOTE — Telephone Encounter (Deleted)
 Left message on voicemail per DPR in reference to upcoming appointment scheduled on 06/29/2023 at 10:15 with detailed instructions given per Myocardial Perfusion Study Information Sheet for the test. LM to arrive 15 minutes early, and that it is imperative to arrive on time for appointment to keep from having the test rescheduled. If you need to cancel or reschedule your appointment, please call the office within 24 hours of your appointment. Failure to do so may result in a cancellation of your appointment, and a $50 no show fee. Phone number given for call back for any questions.

## 2024-06-22 NOTE — Telephone Encounter (Signed)
 Left message on voicemail per DPR in reference to upcoming appointment scheduled on 06/28/2024 at 10:15 with detailed instructions given per Myocardial Perfusion Study Information Sheet for the test. LM to arrive 15 minutes early, and that it is imperative to arrive on time for appointment to keep from having the test rescheduled. If you need to cancel or reschedule your appointment, please call the office within 24 hours of your appointment. Failure to do so may result in a cancellation of your appointment, and a $50 no show fee. Phone number given for call back for any questions.

## 2024-06-28 ENCOUNTER — Ambulatory Visit (HOSPITAL_COMMUNITY)
Admission: RE | Admit: 2024-06-28 | Discharge: 2024-06-28 | Disposition: A | Discharge status: Home / Self Care | Source: Ambulatory Visit | Attending: Cardiovascular Disease | Admitting: Cardiovascular Disease

## 2024-06-28 DIAGNOSIS — R079 Chest pain, unspecified: Secondary | ICD-10-CM | POA: Diagnosis present

## 2024-06-28 LAB — MYOCARDIAL PERFUSION IMAGING
Angina Index: 0
Duke Treadmill Score: 7
Estimated workload: 9
Exercise duration (min): 7 min
Exercise duration (sec): 20 s
LV dias vol: 84 mL (ref 46–106)
LV sys vol: 18 mL
MPHR: 154 {beats}/min
Nuc Stress EF: 79 %
Peak HR: 148 {beats}/min
Percent HR: 96 %
RPE: 18
Rest HR: 61 {beats}/min
Rest Nuclear Isotope Dose: 10.1 mCi
SDS: 0
SRS: 0
SSS: 0
ST Depression (mm): 0 mm
Stress Nuclear Isotope Dose: 32.5 mCi
TID: 0.91

## 2024-06-28 MED ORDER — TECHNETIUM TC 99M TETROFOSMIN IV KIT
10.1000 | PACK | Freq: Once | INTRAVENOUS | Status: AC | PRN
  Administered 2024-06-28: 10.1 via INTRAVENOUS

## 2024-06-28 MED ORDER — TECHNETIUM TC 99M TETROFOSMIN IV KIT
32.5000 | PACK | Freq: Once | INTRAVENOUS | Status: AC | PRN
  Administered 2024-06-28: 32.5 via INTRAVENOUS

## 2024-06-29 ENCOUNTER — Ambulatory Visit: Payer: Self-pay | Admitting: Cardiovascular Disease

## 2024-07-09 ENCOUNTER — Ambulatory Visit: Payer: Self-pay | Admitting: Cardiovascular Disease

## 2024-07-24 ENCOUNTER — Other Ambulatory Visit (HOSPITAL_BASED_OUTPATIENT_CLINIC_OR_DEPARTMENT_OTHER): Payer: Self-pay | Admitting: Family Medicine

## 2024-07-24 DIAGNOSIS — E041 Nontoxic single thyroid nodule: Secondary | ICD-10-CM

## 2024-07-30 ENCOUNTER — Ambulatory Visit (HOSPITAL_BASED_OUTPATIENT_CLINIC_OR_DEPARTMENT_OTHER)

## 2025-06-11 ENCOUNTER — Ambulatory Visit: Admitting: Orthopaedic Surgery
